# Patient Record
Sex: Female | Born: 1995 | Race: White | Hispanic: No | Marital: Single | Smoking: Former smoker
Health system: Southern US, Community
[De-identification: ages and names within clinical notes are randomized; demographics above are authoritative.]

## PROBLEM LIST (undated history)

## (undated) ENCOUNTER — Inpatient Hospital Stay: Payer: Self-pay

## (undated) DIAGNOSIS — N390 Urinary tract infection, site not specified: Secondary | ICD-10-CM

## (undated) HISTORY — DX: Urinary tract infection, site not specified: N39.0

---
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
## 2016-01-22 ENCOUNTER — Emergency Department
Admission: EM | Admit: 2016-01-22 | Discharge: 2016-01-22 | Disposition: A | Discharge status: Home / Self Care | Payer: 59 | Attending: Emergency Medicine | Admitting: Emergency Medicine

## 2016-01-22 DIAGNOSIS — B349 Viral infection, unspecified: Secondary | ICD-10-CM | POA: Insufficient documentation

## 2016-01-22 DIAGNOSIS — J069 Acute upper respiratory infection, unspecified: Secondary | ICD-10-CM | POA: Insufficient documentation

## 2016-01-22 MED ORDER — ONDANSETRON HCL 4 MG PO TABS: 4.0000 mg | ORAL_TABLET | Freq: Every day | ORAL | Status: AC | PRN

## 2016-01-22 NOTE — ED Provider Notes (Signed)
[facility] Emergency Department Provider Note  ____________________________________________  Time seen: Approximately 3:35 PM  I have reviewed the triage vital signs and the nursing notes.   HISTORY  Chief Complaint URI    HPI Kathleen Glass is a 20 y.o. female with 2 days of fevers, chills, mild cough, diarrhea and decreased appetite. No vomiting. Fever 102 last evening. Last diarrhea episode yesterday. No abdominal pain currently. No dysuria or vaginal symptoms.   History reviewed. No pertinent past medical history.  Patient Active Problem List   Diagnosis Date Noted  . Lump or mass in breast 05/30/2013  . 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 Breast surgery Left 06-12-13    benign fibroadenoma    Current Outpatient Rx  Name  Route  Sig  Dispense  Refill  . Alum & Mag Hydroxide-Simeth (MAGIC MOUTHWASH W/LIDOCAINE) SOLN   Oral   Take 5 mLs by mouth 4 (four) times daily.   100 mL   0   . nystatin (MYCOSTATIN) 100000 UNIT/ML suspension   Oral   Take 5 mLs (500,000 Units total) by mouth 4 (four) times daily.   120 mL   0   . ondansetron (ZOFRAN) 4 MG tablet   Oral   Take 1 tablet (4 mg total) by mouth daily as needed for nausea or vomiting.   10 tablet   0     Allergies Review of patient's allergies indicates no known allergies.  Family History  Problem Relation Age of Onset  . Breast cancer Paternal Aunt     great aunt  . Breast cancer Other     Social History Social History  Substance Use Topics  . Smoking status: Never Smoker   . Smokeless tobacco: None  . Alcohol Use: No    Review of Systems Constitutional:  fever/chills Eyes: No visual changes. ENT: No sore throat. Cardiovascular: Denies chest pain. Respiratory: Denies shortness of breath. Gastrointestinal: per HPI Genitourinary: Negative for dysuria. Musculoskeletal: Negative for back pain. Skin: Negative for  rash. Neurological: Negative for headaches, focal weakness or numbness. 10-point ROS otherwise negative.  ____________________________________________   PHYSICAL EXAM:  VITAL SIGNS: ED Triage Vitals  Enc Vitals Group     BP 01/22/16 1444 127/90 mmHg     Pulse Rate 01/22/16 1444 104     Resp 01/22/16 1444 16     Temp 01/22/16 1444 98.4 F (36.9 C)     Temp Source 01/22/16 1444 Oral     SpO2 01/22/16 1444 97 %     Weight 01/22/16 1444 108 lb (48.988 kg)     Height 01/22/16 1444 5\' 2"  (1.575 m)     Head Cir --      Peak Flow --      Pain Score 01/22/16 1445 9     Pain Loc --      Pain Edu? --      Excl. in [place]? --     Constitutional: Alert and oriented. Well appearing and in no acute distress. Eyes: Conjunctivae are normal. PERRL. EOMI. Ears:  Clear with normal landmarks. No erythema. Head: Atraumatic. Nose: No congestion/rhinnorhea. Mouth/Throat: Mucous membranes are moist.  Oropharynx non-erythematous. No lesions. Neck:  Supple.  No adenopathy.   Cardiovascular: Normal rate, regular rhythm. Grossly normal heart sounds.  Good peripheral circulation. Respiratory: Normal respiratory effort.  No retractions. Lungs CTAB. Gastrointestinal: Soft and nontender. No distention. No abdominal bruits.  Musculoskeletal: Nml ROM of upper and  lower extremity joints. Neurologic:  Normal speech and language. No gross focal neurologic deficits are appreciated. No gait instability. Skin:  Skin is warm, dry and intact. No rash noted. Psychiatric: Mood and affect are normal. Speech and behavior are normal.  ____________________________________________   LABS (all labs ordered are listed, but only abnormal results are displayed)  Labs Reviewed - No data to display ____________________________________________  EKG   ____________________________________________  RADIOLOGY   ____________________________________________   PROCEDURES  Procedure(s) performed: None  Critical Care  performed: No  ____________________________________________   INITIAL IMPRESSION / ASSESSMENT AND PLAN / ED COURSE  Pertinent labs & imaging results that were available during my care of the patient were reviewed by me and considered in my medical decision making (see chart for details).  20 year old with 2 days of fever, chills, myalgias, cough, diarrhea consistent with viral syndrome possibly, influenza. Recommend symptomatic treatment.  Last episode of diarrhea was yesterday. She is taking by mouth fluids today. Continue ibuprofen or Tylenol as needed. Given Zofran if needed. Return to emergency for any worsening symptoms. ____________________________________________   FINAL CLINICAL IMPRESSION(S) / ED DIAGNOSES  Final diagnoses:  Viral syndrome      Mortimer Fries, PA-C 01/22/16 1538  Mortimer Fries, PA-C 01/22/16 1538  Hinda Kehr, MD 01/22/16 2054

## 2016-01-22 NOTE — ED Notes (Signed)
Pt presents with cough, body aches and diarrhea for two days. Pt states has had fever up to 101.

## 2016-01-22 NOTE — ED Notes (Signed)
Pt c/o cough with congestion with bodyaches for the past 2 days.

## 2016-01-22 NOTE — Discharge Instructions (Signed)
Take nausea medicine if needed. Continue on bland diet mostly liquids. Especially Gatorade. Return to emergency room for any worsening symptoms. Continue Profen as needed for fever and body aches.

## 2016-02-18 ENCOUNTER — Emergency Department
Admission: EM | Admit: 2016-02-18 | Discharge: 2016-02-18 | Disposition: A | Discharge status: Home / Self Care | Payer: Self-pay | Attending: Emergency Medicine | Admitting: Emergency Medicine

## 2016-02-18 ENCOUNTER — Encounter: Payer: Self-pay | Admitting: Emergency Medicine

## 2016-02-18 DIAGNOSIS — F17210 Nicotine dependence, cigarettes, uncomplicated: Secondary | ICD-10-CM | POA: Insufficient documentation

## 2016-02-18 DIAGNOSIS — N912 Amenorrhea, unspecified: Secondary | ICD-10-CM | POA: Insufficient documentation

## 2016-02-18 LAB — POCT PREGNANCY, URINE: Preg Test, Ur: NEGATIVE

## 2016-02-18 NOTE — ED Notes (Signed)
States breast tenderness began 2 weeks ago. Last period beginning of month. States took OTC pregnancy test and was negative but came here because she wants to be sure.

## 2016-02-18 NOTE — ED Provider Notes (Signed)
[facility] Emergency Department Provider Note  ____________________________________________  Time seen: Approximately 4:04 PM  I have reviewed the triage vital signs and the nursing notes.   HISTORY  Chief Complaint Breast Pain    HPI Kathleen Glass is a 20 y.o. female presents for evaluation of patient's status. Patient states that she had a period 2 weeks ago but wasn't normal. Took over-the-counter pregnancy test on this morning was negative she just wants verification. Denies any other complaints at this time.   History reviewed. No pertinent past medical history.  Patient Active Problem List   Diagnosis Date Noted  . Lump or mass in breast 05/30/2013  . 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 Breast surgery Left 06-12-13    benign fibroadenoma    Current Outpatient Rx  Name  Route  Sig  Dispense  Refill  . Alum & Mag Hydroxide-Simeth (MAGIC MOUTHWASH W/LIDOCAINE) SOLN   Oral   Take 5 mLs by mouth 4 (four) times daily.   100 mL   0   . nystatin (MYCOSTATIN) 100000 UNIT/ML suspension   Oral   Take 5 mLs (500,000 Units total) by mouth 4 (four) times daily.   120 mL   0   . ondansetron (ZOFRAN) 4 MG tablet   Oral   Take 1 tablet (4 mg total) by mouth daily as needed for nausea or vomiting.   10 tablet   0     Allergies Review of patient's allergies indicates no known allergies.  Family History  Problem Relation Age of Onset  . Breast cancer Paternal Aunt     great aunt  . Breast cancer Other     Social History Social History  Substance Use Topics  . Smoking status: Current Every Day Smoker -- 0.25 packs/day    Types: Cigarettes  . Smokeless tobacco: None  . Alcohol Use: No    Review of Systems Constitutional: No fever/chills Eyes: No visual changes. ENT: No sore throat. Cardiovascular: Denies chest pain. Respiratory: Denies shortness of breath. Gastrointestinal: No  abdominal pain.  No nausea, no vomiting.  No diarrhea.  No constipation. Genitourinary: Negative for dysuria. Musculoskeletal: Negative for back pain. Skin: Negative for rash. Neurological: Negative for headaches, focal weakness or numbness.  10-point ROS otherwise negative.  ____________________________________________   PHYSICAL EXAM:  VITAL SIGNS: ED Triage Vitals  Enc Vitals Group     BP 02/18/16 1407 135/82 mmHg     Pulse Rate 02/18/16 1407 113     Resp 02/18/16 1407 18     Temp 02/18/16 1407 97.8 F (36.6 C)     Temp Source 02/18/16 1407 Oral     SpO2 02/18/16 1407 96 %     Weight 02/18/16 1407 114 lb 8 oz (51.937 kg)     Height 02/18/16 1407 5\' 2"  (1.575 m)     Head Cir --      Peak Flow --      Pain Score --      Pain Loc --      Pain Edu? --      Excl. in [place]? --     Constitutional: Alert and oriented. Well appearing and in no acute distress. Cardiovascular: Normal rate, regular rhythm. Grossly normal heart sounds.  Good peripheral circulation. Respiratory: Normal respiratory effort.  No retractions. Lungs CTAB. Gastrointestinal: Soft and nontender. No distention. No abdominal bruits. No CVA tenderness. Musculoskeletal: No lower extremity tenderness nor edema.  No  joint effusions. Neurologic:  Normal speech and language. No gross focal neurologic deficits are appreciated. No gait instability. Skin:  Skin is warm, dry and intact. No rash noted. Psychiatric: Mood and affect are normal. Speech and behavior are normal.  ____________________________________________   LABS (all labs ordered are listed, but only abnormal results are displayed)  Labs Reviewed  POC URINE PREG, ED  POCT PREGNANCY, URINE   ____________________________________________    PROCEDURES  Procedure(s) performed: None  Critical Care performed: No  ____________________________________________   INITIAL IMPRESSION / ASSESSMENT AND PLAN / ED COURSE  Pertinent labs & imaging results  that were available during my care of the patient were reviewed by me and considered in my medical decision making (see chart for details).  Negative pregnancy. Encourage patient to repeat test in 2 weeks. For confirmation. Follow up PCP as needed. ____________________________________________   FINAL CLINICAL IMPRESSION(S) / ED DIAGNOSES  Final diagnoses:  Amenorrhea     This chart was dictated using voice recognition software/Dragon. Despite best efforts to proofread, errors can occur which can change the meaning. Any change was purely unintentional.   Arlyss Repress, PA-C 02/18/16 1605  Earleen Newport, MD 02/18/16 [phone]

## 2016-03-30 ENCOUNTER — Emergency Department
Admission: EM | Admit: 2016-03-30 | Discharge: 2016-03-30 | Disposition: A | Discharge status: Home / Self Care | Payer: Self-pay | Attending: Emergency Medicine | Admitting: Emergency Medicine

## 2016-03-30 ENCOUNTER — Encounter: Payer: Self-pay | Admitting: Emergency Medicine

## 2016-03-30 DIAGNOSIS — F17210 Nicotine dependence, cigarettes, uncomplicated: Secondary | ICD-10-CM | POA: Insufficient documentation

## 2016-03-30 DIAGNOSIS — N939 Abnormal uterine and vaginal bleeding, unspecified: Secondary | ICD-10-CM | POA: Insufficient documentation

## 2016-03-30 DIAGNOSIS — Z79899 Other long term (current) drug therapy: Secondary | ICD-10-CM | POA: Insufficient documentation

## 2016-03-30 LAB — URINALYSIS COMPLETE WITH MICROSCOPIC (ARMC ONLY)
Bilirubin Urine: NEGATIVE
GLUCOSE, UA: NEGATIVE mg/dL
KETONES UR: NEGATIVE mg/dL
NITRITE: NEGATIVE
PH: 6 (ref 5.0–8.0)
Protein, ur: 30 mg/dL — AB
SPECIFIC GRAVITY, URINE: 1.024 (ref 1.005–1.030)

## 2016-03-30 LAB — POCT PREGNANCY, URINE: Preg Test, Ur: NEGATIVE

## 2016-03-30 LAB — HCG, QUANTITATIVE, PREGNANCY: hCG, Beta Chain, Quant, S: <1 m[IU]/mL (ref <5)

## 2016-03-30 NOTE — ED Notes (Addendum)
See triage note  States her lower abd pressure stop but still having lower back pain. Also took 2 home preg test that were neg   Denies any injury to back   Also has had vaginal bleeding for about 2 months since stopping birth control  States she is having bleeding daily  Sometimes more than others .Kathleen Glass

## 2016-03-30 NOTE — ED Notes (Signed)
POCT PREGNANCY NEGATIVE

## 2016-03-30 NOTE — ED Provider Notes (Signed)
[facility] Emergency Department Provider Note  ____________________________________________  Time seen: Approximately 2:19 PM  I have reviewed the triage vital signs and the nursing notes.   HISTORY  Chief Complaint Back Pain    HPI Kathleen Glass is a 20 y.o. female , NAD, presents to the emergency department for 1 month history of abnormal menstrual cycle, lower abdominal pain and lower back pain. States she was administered the Depo-Provera shot approximately 4 months ago. Was unable to pay for the injections nor her pasty balance at the health department therefore was unable to go back 3 months later for her quarterly injection. States she had a normal menstrual cycle the first month after missing the second quarterly injection but over the last 30 days has had alternating menstrual flows with some breast tenderness, abdominal cramping and lower back pain. Denies any dysuria, hematuria, vaginal discharge other than the menstrual blood. States she has had increasing breast tenderness over the last couple of days which is not common for her menstrual cycles. Also notes lower back pain which is common for her menstrual cycle. Patient does not have a primary care provider nor OB/GYN. She is sexually active but again denies any exposures or history of STDs. Her mother is present at the bedside and is concerned about abnormal pregnancy.   History reviewed. No pertinent past medical history.  Patient Active Problem List   Diagnosis Date Noted  . Lump or mass in breast 05/30/2013  . 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 Breast surgery Left 06-12-13    benign fibroadenoma    Current Outpatient Rx  Name  Route  Sig  Dispense  Refill  . Alum & Mag Hydroxide-Simeth (MAGIC MOUTHWASH W/LIDOCAINE) SOLN   Oral   Take 5 mLs by mouth 4 (four) times daily.   100 mL   0   . nystatin (MYCOSTATIN) 100000 UNIT/ML  suspension   Oral   Take 5 mLs (500,000 Units total) by mouth 4 (four) times daily.   120 mL   0   . ondansetron (ZOFRAN) 4 MG tablet   Oral   Take 1 tablet (4 mg total) by mouth daily as needed for nausea or vomiting.   10 tablet   0     Allergies Review of patient's allergies indicates no known allergies.  Family History  Problem Relation Age of Onset  . Breast cancer Paternal Aunt     great aunt  . Breast cancer Other     Social History Social History  Substance Use Topics  . Smoking status: Current Every Day Smoker -- 0.25 packs/day    Types: Cigarettes  . Smokeless tobacco: None  . Alcohol Use: No     Review of Systems  Constitutional: No fever/chills, fatigue Eyes: No visual changes.  Cardiovascular: No chest pain. Respiratory: No cough. No shortness of breath. No wheezing.  Gastrointestinal: Positive lower abdominal pain.  No nausea, vomiting.  No diarrhea.  No constipation. Genitourinary: Positive abnormal menses. Negative for dysuria, hematuria. No urinary hesitancy, urgency or increased frequency. Musculoskeletal: Positive for lower back pain and breast tenderness.  Skin: Negative for rash, skin sores, redness, swelling, and wounds or lacerations. Neurological: Negative for headaches, focal weakness or numbness. No tingling 10-point ROS otherwise negative.  ____________________________________________   PHYSICAL EXAM:  VITAL SIGNS: ED Triage Vitals  Enc Vitals Group     BP 03/30/16 1257 131/71 mmHg     Pulse Rate 03/30/16  1257 79     Resp 03/30/16 1257 20     Temp 03/30/16 1257 98.3 F (36.8 C)     Temp Source 03/30/16 1257 Oral     SpO2 03/30/16 1257 100 %     Weight 03/30/16 1257 115 lb (52.164 kg)     Height 03/30/16 1257 5\' 3"  (1.6 m)     Head Cir --      Peak Flow --      Pain Score 03/30/16 1259 8     Pain Loc --      Pain Edu? --      Excl. in [place]? --      Constitutional: Alert and oriented. Well appearing and in no acute  distress. Eyes: Conjunctivae are normal.  Head: Atraumatic. Neck: Supple with full range of motion Hematological/Lymphatic/Immunilogical: No cervical lymphadenopathy. Cardiovascular: Normal rate, regular rhythm. Normal S1 and S2.  Good peripheral circulation with 2+ pulses noted in the upper extremities. Respiratory: Normal respiratory effort without tachypnea or retractions. Lungs CTAB with breath sounds noted in all lung fields. Gastrointestinal: Mild tenderness with deep palpation about the suprapubic area. All other quadrants were soft and nontender without distention, guarding. No CVA tenderness. Musculoskeletal: No lower extremity tenderness nor edema.  No joint effusions. Neurologic:  Normal speech and language. No gross focal neurologic deficits are appreciated.  Skin:  Skin is warm, dry and intact. No rash, redness, skin sores, open wounds or lacerations noted. Psychiatric: Mood and affect are normal. Speech and behavior are normal. Patient exhibits appropriate insight and judgement.   ____________________________________________   LABS (all labs ordered are listed, but only abnormal results are displayed)  Labs Reviewed  URINALYSIS COMPLETEWITH MICROSCOPIC ([place] ONLY) - Abnormal; Notable for the following:    Color, Urine YELLOW (*)    APPearance CLOUDY (*)    Hgb urine dipstick 3+ (*)    Protein, ur 30 (*)    Leukocytes, UA TRACE (*)    Bacteria, UA FEW (*)    Squamous Epithelial / LPF 6-30 (*)    All other components within normal limits  URINE CULTURE  HCG, QUANTITATIVE, PREGNANCY  POC URINE PREG, ED  POCT PREGNANCY, URINE   ____________________________________________  EKG  None ____________________________________________  RADIOLOGY  None ____________________________________________    PROCEDURES  Procedure(s) performed: None   Medications - No data to display   ____________________________________________   INITIAL IMPRESSION / ASSESSMENT  AND PLAN / ED COURSE  Pertinent lab results that were available during my care of the patient were reviewed by me and considered in my medical decision making (see chart for details).  Patient's diagnosis is consistent with abnormal uterine bleeding due to hormonal imbalance. Long discussion had with the patient and her mother at the bedside in regards to her current menstrual cycle. Anticipate is related to no longer bleeding on the Depo-Provera and will regulate within the coming month. Patient is advised to establish care with a local primary care provider or OB/GYN for follow-up if menstrual cycle does not regulate within 30 days. May take over-the-counter Advil or Aleve as needed for pain. We will culture the patient's urine to ensure no urinary tract infection. If patient has any increasing pain or onset of any new symptoms she is to return to this for further evaluation and treatment. Both the patient and her mother verbalized understanding of her physical exam, laboratory findings and discussion of discharge instructions.   ____________________________________________  FINAL CLINICAL IMPRESSION(S) / ED DIAGNOSES  Final diagnoses:  Abnormal  uterine bleeding      NEW MEDICATIONS STARTED DURING THIS VISIT:  Discharge Medication List as of 03/30/2016  3:13 PM           Braxton Feathers, PA-C 03/30/16 1632  Wandra Arthurs, MD 03/30/16 2157

## 2016-03-30 NOTE — ED Notes (Signed)
Having lower abd pressure and lower back pain  With some breast temderness

## 2016-03-30 NOTE — Discharge Instructions (Signed)

## 2016-04-01 LAB — URINE CULTURE: Culture: >=100000 — AB

## 2016-04-02 ENCOUNTER — Telehealth: Payer: Self-pay | Admitting: Pharmacist

## 2016-04-02 NOTE — Telephone Encounter (Signed)
Pt called and informed urine cx came back positive for UTI. Pt states she uses [facility] on [address]. Pt does not have RX insurance. Informed that drug is on [facility] 4$ list. [facility] left message on provider voicemail. Bactrim DS 1 tab po BID x 3 days. Rx authorized by Dr. Edd Fabian.   Ramond Dial, Pharm.D Clinical Pharmacist

## 2016-09-30 ENCOUNTER — Emergency Department
Admission: EM | Admit: 2016-09-30 | Discharge: 2016-09-30 | Disposition: A | Discharge status: Home / Self Care | Payer: Self-pay | Attending: Emergency Medicine | Admitting: Emergency Medicine

## 2016-09-30 ENCOUNTER — Encounter: Payer: Self-pay | Admitting: Emergency Medicine

## 2016-09-30 DIAGNOSIS — F17210 Nicotine dependence, cigarettes, uncomplicated: Secondary | ICD-10-CM | POA: Insufficient documentation

## 2016-09-30 DIAGNOSIS — N390 Urinary tract infection, site not specified: Secondary | ICD-10-CM | POA: Insufficient documentation

## 2016-09-30 DIAGNOSIS — E860 Dehydration: Secondary | ICD-10-CM | POA: Insufficient documentation

## 2016-09-30 DIAGNOSIS — R197 Diarrhea, unspecified: Secondary | ICD-10-CM

## 2016-09-30 DIAGNOSIS — Z79899 Other long term (current) drug therapy: Secondary | ICD-10-CM | POA: Insufficient documentation

## 2016-09-30 LAB — CBC
HEMATOCRIT: 43.2 % (ref 35.0–47.0)
Hemoglobin: 14.4 g/dL (ref 12.0–16.0)
MCH: 26.9 pg (ref 26.0–34.0)
MCHC: 33.5 g/dL (ref 32.0–36.0)
MCV: 80.5 fL (ref 80.0–100.0)
PLATELETS: 290 10*3/uL (ref 150–440)
RBC: 5.36 MIL/uL — ABNORMAL HIGH (ref 3.80–5.20)
RDW: 13.4 % (ref 11.5–14.5)
WBC: 6.5 10*3/uL (ref 3.6–11.0)

## 2016-09-30 LAB — COMPREHENSIVE METABOLIC PANEL
ALBUMIN: 4.5 g/dL (ref 3.5–5.0)
ALT: 22 U/L (ref 14–54)
AST: 22 U/L (ref 15–41)
Alkaline Phosphatase: 70 U/L (ref 38–126)
Anion gap: 6 (ref 5–15)
BILIRUBIN TOTAL: 0.5 mg/dL (ref 0.3–1.2)
BUN: 17 mg/dL (ref 6–20)
CHLORIDE: 104 mmol/L (ref 101–111)
CO2: 27 mmol/L (ref 22–32)
Calcium: 9.8 mg/dL (ref 8.9–10.3)
Creatinine, Ser: 0.73 mg/dL (ref 0.44–1.00)
GFR calc Af Amer: >60 mL/min (ref >60)
GLUCOSE: 99 mg/dL (ref 65–99)
POTASSIUM: 4.1 mmol/L (ref 3.5–5.1)
Sodium: 137 mmol/L (ref 135–145)
Total Protein: 8.3 g/dL — ABNORMAL HIGH (ref 6.5–8.1)

## 2016-09-30 LAB — URINALYSIS COMPLETE WITH MICROSCOPIC (ARMC ONLY)
Bilirubin Urine: NEGATIVE
GLUCOSE, UA: NEGATIVE mg/dL
Hgb urine dipstick: NEGATIVE
Ketones, ur: NEGATIVE mg/dL
Nitrite: NEGATIVE
PROTEIN: 100 mg/dL — AB
Specific Gravity, Urine: 1.025 (ref 1.005–1.030)
pH: 5 (ref 5.0–8.0)

## 2016-09-30 LAB — LIPASE, BLOOD: Lipase: 17 U/L (ref 11–51)

## 2016-09-30 LAB — POCT PREGNANCY, URINE: PREG TEST UR: NEGATIVE

## 2016-09-30 MED ORDER — DEXTROSE 5 % IV SOLN
1.0000 g | Freq: Once | INTRAVENOUS | Status: AC
  Administered 2016-09-30: 1 g via INTRAVENOUS
  Filled 2016-09-30 (×2): qty 10

## 2016-09-30 MED ORDER — SODIUM CHLORIDE 0.9 % IV BOLUS (SEPSIS)
1000.0000 mL | Freq: Once | INTRAVENOUS | Status: AC
  Administered 2016-09-30: 1000 mL via INTRAVENOUS

## 2016-09-30 MED ORDER — ONDANSETRON HCL 4 MG/2ML IJ SOLN
4.0000 mg | Freq: Once | INTRAMUSCULAR | Status: AC
  Administered 2016-09-30: 4 mg via INTRAVENOUS
  Filled 2016-09-30: qty 2

## 2016-09-30 MED ORDER — KETOROLAC TROMETHAMINE 30 MG/ML IJ SOLN
30.0000 mg | Freq: Once | INTRAMUSCULAR | Status: AC
  Administered 2016-09-30: 30 mg via INTRAVENOUS
  Filled 2016-09-30: qty 1

## 2016-09-30 MED ORDER — CEPHALEXIN 500 MG PO CAPS: 500.0000 mg | ORAL_CAPSULE | Freq: Four times a day (QID) | ORAL | 0 refills | Status: AC

## 2016-09-30 MED ORDER — ONDANSETRON 4 MG PO TBDP: 4.0000 mg | ORAL_TABLET | Freq: Three times a day (TID) | ORAL | 0 refills | Status: DC | PRN

## 2016-09-30 NOTE — Discharge Instructions (Signed)

## 2016-09-30 NOTE — ED Triage Notes (Signed)
Says she has dizziness, back pain.  Says she needs pregnancy test.

## 2016-09-30 NOTE — ED Provider Notes (Signed)
[facility] Emergency Department Provider Note  ____________________________________________  Time seen: Approximately 12:29 PM  I have reviewed the triage vital signs and the nursing notes.   HISTORY  Chief Complaint Dizziness and Back Pain   HPI Kathleen Glass is a 20 y.o. female no significant past medical history who presents for evaluation of dizziness, frequency, and back pain. Patient reports a week of urinary frequency. She has had 2 days of worsening low back pain. She does have a history of chronic back pain however the pain is more pronounced, dull, moderate, located in her bilateral flanks. Since yesterday patient has had nausea and 1 episode of nonbloody nonbilious emesis. She also has had diarrhea for the last 2 days with watery non-melanotic stools. Patient denies fever, chills, coughing, shortness of breath, chest pain. LMP was 2 weeks ago. She does not take any birth control. Since yesterday patient is been feeling dizzy every time she stands up. She also endorses mild cramping suprapubic abdominal pain 1 week that is intermittent and nonradiating. No pain at this time.  History reviewed. No pertinent past medical history.  Patient Active Problem List   Diagnosis Date Noted  . Lump or mass in breast 05/30/2013  . 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 BREAST SURGERY Left 06-12-13   benign fibroadenoma    Prior to Admission medications   Medication Sig Start Date End Date Taking? Authorizing Provider  Alum & Mag Hydroxide-Simeth (MAGIC MOUTHWASH W/LIDOCAINE) SOLN Take 5 mLs by mouth 4 (four) times daily. 06/02/15   Sable Feil, PA-C  cephALEXin (KEFLEX) 500 MG capsule Take 1 capsule (500 mg total) by mouth 4 (four) times daily. 09/30/16 10/07/16  Rudene Re, MD  nystatin (MYCOSTATIN) 100000 UNIT/ML suspension Take 5 mLs (500,000 Units total) by mouth 4 (four) times daily. 06/02/15    Sable Feil, PA-C  ondansetron (ZOFRAN ODT) 4 MG disintegrating tablet Take 1 tablet (4 mg total) by mouth every 8 (eight) hours as needed for nausea or vomiting. 09/30/16   Rudene Re, MD  ondansetron (ZOFRAN) 4 MG tablet Take 1 tablet (4 mg total) by mouth daily as needed for nausea or vomiting. 01/22/16 01/21/17  Mortimer Fries, PA-C    Allergies Patient has no known allergies.  Family History  Problem Relation Age of Onset  . Breast cancer Other   . Breast cancer Paternal Aunt     great aunt    Social History Social History  Substance Use Topics  . Smoking status: Current Every Day Smoker    Packs/day: 0.25    Types: Cigarettes  . Smokeless tobacco: Never Used  . Alcohol use No    Review of Systems  Constitutional: Negative for fever. Eyes: Negative for visual changes. ENT: Negative for sore throat. Neck: No neck pain  Cardiovascular: Negative for chest pain. Respiratory: Negative for shortness of breath. Gastrointestinal: + suprapubic abdominal pain, nausea, vomiting and diarrhea. Genitourinary: Negative for dysuria. + frequency Musculoskeletal: Negative for back pain. + b/l flank pain Skin: Negative for rash. Neurological: Negative for headaches, weakness or numbness. Psych: No SI or HI  ____________________________________________   PHYSICAL EXAM:  VITAL SIGNS: ED Triage Vitals  Enc Vitals Group     BP 09/30/16 0959 124/74     Pulse Rate 09/30/16 0959 89     Resp 09/30/16 0959 20     Temp 09/30/16 0959 98.7 F (37.1 C)     Temp Source 09/30/16  [id number] Oral     SpO2 09/30/16 0959 100 %     Weight 09/30/16 0959 115 lb (52.2 kg)     Height 09/30/16 0959 5\' 2"  (1.575 m)     Head Circumference --      Peak Flow --      Pain Score 09/30/16 0954 8     Pain Loc --      Pain Edu? --      Excl. in [place]? --     Constitutional: Alert and oriented. Well appearing and in no apparent distress. HEENT:      Head: Normocephalic and atraumatic.         Eyes:  Conjunctivae are normal. Sclera is non-icteric. EOMI. PERRL      Mouth/Throat: Mucous membranes are moist.       Neck: Supple with no signs of meningismus. Cardiovascular: Regular rate and rhythm. No murmurs, gallops, or rubs. 2+ symmetrical distal pulses are present in all extremities. No JVD. Respiratory: Normal respiratory effort. Lungs are clear to auscultation bilaterally. No wheezes, crackles, or rhonchi.  Gastrointestinal: Soft, non tender, and non distended with positive bowel sounds. No rebound or guarding. Genitourinary: No CVA tenderness. Musculoskeletal: Nontender with normal range of motion in all extremities. No edema, cyanosis, or erythema of extremities. Neurologic: Normal speech and language. Face is symmetric. Moving all extremities. No gross focal neurologic deficits are appreciated. Skin: Skin is warm, dry and intact. No rash noted. Psychiatric: Mood and affect are normal. Speech and behavior are normal.  ____________________________________________   LABS (all labs ordered are listed, but only abnormal results are displayed)  Labs Reviewed  COMPREHENSIVE METABOLIC PANEL - Abnormal; Notable for the following:       Result Value   Total Protein 8.3 (*)    All other components within normal limits  CBC - Abnormal; Notable for the following:    RBC 5.36 (*)    All other components within normal limits  URINALYSIS COMPLETEWITH MICROSCOPIC ([facility] ONLY) - Abnormal; Notable for the following:    Color, Urine YELLOW (*)    APPearance CLOUDY (*)    Protein, ur 100 (*)    Leukocytes, UA TRACE (*)    Bacteria, UA FEW (*)    Squamous Epithelial / LPF 6-30 (*)    All other components within normal limits  URINE CULTURE  LIPASE, BLOOD  POC URINE PREG, ED  POCT PREGNANCY, URINE   ____________________________________________  EKG  none  ____________________________________________  RADIOLOGY  none   ____________________________________________   PROCEDURES  Procedure(s) performed: None Procedures Critical Care performed:  None ____________________________________________   INITIAL IMPRESSION / ASSESSMENT AND PLAN / ED COURSE  20 y.o. female no significant past medical history who presents for evaluation of 1 week of urinary frequency, 2 days of diarrhea, nausea, vomiting, suprapubic cramping, urinary frequency, and flank pain. Patient orthostatic on exam. Her vital signs are otherwise within normal limits. Her abdomen is soft and nontender, no flank tenderness. Blood work showed a normal white count, normal kidney function, pregnancy is negative. UA consistent with urinary tract infection. We'll give her IV fluids, IV Toradol, IV Zofran, IV ceftriaxone. Anticipate discharge.  Clinical Course    Patient Received 2 L of fluid, IV Toradol, IV Zofran, and IV ceftriaxone. She reports that she feels markedly improved. She'll be discharged home on Keflex with close follow-up with primary care doctor.   Pertinent labs & imaging results that were available during my care of the patient were reviewed by me and  considered in my medical decision making (see chart for details).    ____________________________________________   FINAL CLINICAL IMPRESSION(S) / ED DIAGNOSES  Final diagnoses:  Urinary tract infection without hematuria, site unspecified  Dehydration  Diarrhea, unspecified type      NEW MEDICATIONS STARTED DURING THIS VISIT:  New Prescriptions   CEPHALEXIN (KEFLEX) 500 MG CAPSULE    Take 1 capsule (500 mg total) by mouth 4 (four) times daily.   ONDANSETRON (ZOFRAN ODT) 4 MG DISINTEGRATING TABLET    Take 1 tablet (4 mg total) by mouth every 8 (eight) hours as needed for nausea or vomiting.     Note:  This document was prepared using Dragon voice recognition software and may include unintentional dictation errors.    Rudene Re, MD 09/30/16 [phone]

## 2016-10-01 LAB — URINE CULTURE: CULTURE: NO GROWTH

## 2016-11-01 NOTE — L&D Delivery Note (Signed)
1635 In room to see pt, reports pelvic pressure with the urge to push. Coached maternal pushing efforts initiated and descent of fetal head noted. Variable decelerations present with contractions. Pt turned to left lateral position and encourage to push every other contraction.   43 Dr. Amalia Hailey notified of [place] tracing by charge RN, on his way for bedside evaluation.   Spontaneous vaginal birth of liveborn female patient at 64 in vertex position, left occiput posterior position. Triple nuchal cord reduced on perineum. Infant immediately to maternal abdomen.  Cord clamped and cord gas collected. Infant to receiving nurse and NNP for evaluation.  APGAR: 7,8 ; weight 3160 grams (6 lb 15.5 oz).    Pitocin infusing. Spontaneous delivery of placenta 1740. Bilateral labial lacerations repaired under local and epidural anesthesia with 3-0 vicryl rapide. QBL: 149 ml.   Initiate routine postpartum care and orders. Mom to postpartum.  Baby to special care nursery for evaluation.    Diona Fanti, CNM 10/07/2017, 6:59 PM

## 2017-01-23 ENCOUNTER — Emergency Department: Payer: Medicaid Other

## 2017-01-23 ENCOUNTER — Encounter: Payer: Self-pay | Admitting: Radiology

## 2017-01-23 ENCOUNTER — Emergency Department
Admission: EM | Admit: 2017-01-23 | Discharge: 2017-01-23 | Disposition: A | Discharge status: Home / Self Care | Payer: Medicaid Other | Attending: Emergency Medicine | Admitting: Emergency Medicine

## 2017-01-23 DIAGNOSIS — R102 Pelvic and perineal pain: Secondary | ICD-10-CM | POA: Diagnosis not present

## 2017-01-23 DIAGNOSIS — N644 Mastodynia: Secondary | ICD-10-CM | POA: Diagnosis not present

## 2017-01-23 DIAGNOSIS — O9989 Other specified diseases and conditions complicating pregnancy, childbirth and the puerperium: Secondary | ICD-10-CM

## 2017-01-23 DIAGNOSIS — Z3A01 Less than 8 weeks gestation of pregnancy: Secondary | ICD-10-CM

## 2017-01-23 DIAGNOSIS — N760 Acute vaginitis: Secondary | ICD-10-CM

## 2017-01-23 DIAGNOSIS — M545 Low back pain, unspecified: Secondary | ICD-10-CM

## 2017-01-23 DIAGNOSIS — F17210 Nicotine dependence, cigarettes, uncomplicated: Secondary | ICD-10-CM | POA: Diagnosis not present

## 2017-01-23 DIAGNOSIS — O99891 Other specified diseases and conditions complicating pregnancy: Secondary | ICD-10-CM

## 2017-01-23 DIAGNOSIS — O23591 Infection of other part of genital tract in pregnancy, first trimester: Secondary | ICD-10-CM | POA: Insufficient documentation

## 2017-01-23 DIAGNOSIS — R1030 Lower abdominal pain, unspecified: Secondary | ICD-10-CM

## 2017-01-23 DIAGNOSIS — B9689 Other specified bacterial agents as the cause of diseases classified elsewhere: Secondary | ICD-10-CM

## 2017-01-23 LAB — POCT PREGNANCY, URINE: Preg Test, Ur: POSITIVE — AB

## 2017-01-23 LAB — URINALYSIS, COMPLETE (UACMP) WITH MICROSCOPIC
BILIRUBIN URINE: NEGATIVE
GLUCOSE, UA: NEGATIVE mg/dL
HGB URINE DIPSTICK: NEGATIVE
Ketones, ur: NEGATIVE mg/dL
LEUKOCYTES UA: NEGATIVE
NITRITE: NEGATIVE
PH: 6 (ref 5.0–8.0)
Protein, ur: 100 mg/dL — AB
SPECIFIC GRAVITY, URINE: 1.025 (ref 1.005–1.030)

## 2017-01-23 LAB — HCG, QUANTITATIVE, PREGNANCY: hCG, Beta Chain, Quant, S: 618 m[IU]/mL — ABNORMAL HIGH (ref <5)

## 2017-01-23 LAB — CHLAMYDIA/NGC RT PCR (ARMC ONLY)
Chlamydia Tr: NOT DETECTED
N gonorrhoeae: NOT DETECTED

## 2017-01-23 LAB — WET PREP, GENITAL
Sperm: NONE SEEN
TRICH WET PREP: NONE SEEN
WBC WET PREP: NONE SEEN
YEAST WET PREP: NONE SEEN

## 2017-01-23 MED ORDER — ACETAMINOPHEN 325 MG PO TABS
650.0000 mg | ORAL_TABLET | Freq: Once | ORAL | Status: AC
  Administered 2017-01-23: 650 mg via ORAL
  Filled 2017-01-23: qty 2

## 2017-01-23 MED ORDER — METRONIDAZOLE 500 MG PO TABS: 500.0000 mg | ORAL_TABLET | Freq: Two times a day (BID) | ORAL | 0 refills | Status: AC

## 2017-01-23 MED ORDER — PRENATAL VITAMINS 0.8 MG PO TABS
1.0000 | ORAL_TABLET | Freq: Every day | ORAL | 0 refills | Status: DC
Start: 2017-01-23 — End: 2018-06-27

## 2017-01-23 MED ORDER — METRONIDAZOLE 500 MG PO TABS
500.0000 mg | ORAL_TABLET | Freq: Once | ORAL | Status: AC
  Administered 2017-01-23: 500 mg via ORAL

## 2017-01-23 MED ORDER — ONDANSETRON 4 MG PO TBDP: 4.0000 mg | ORAL_TABLET | Freq: Three times a day (TID) | ORAL | 0 refills | Status: DC | PRN

## 2017-01-23 MED ORDER — METRONIDAZOLE 500 MG PO TABS
ORAL_TABLET | ORAL | Status: AC
  Administered 2017-01-23: 500 mg via ORAL
  Filled 2017-01-23: qty 1

## 2017-01-23 NOTE — ED Notes (Signed)
Pt. Going home with boyfriend.

## 2017-01-23 NOTE — ED Triage Notes (Signed)
Pt reports lower abd pain, lower back pain, breast tenderness, late for her menstral cycle, denies pain with urination

## 2017-01-23 NOTE — Discharge Instructions (Addendum)
Please drink plenty of fluid to stay well hydrated.  Take a prenatal vitamin daily. Finish the entire course of Flagyl for your bacterial vaginosis, even if you are feeling fine.  Please make an appointment to see Dr. Marcelline Mates, the OB-Gyn on call.  Please return to the emergency department for severe pain, fever, vaginal bleeding, or any other symptoms concerning to you.

## 2017-01-23 NOTE — ED Provider Notes (Signed)
[facility] Emergency Department Provider Note  ____________________________________________  Time seen: Approximately 7:23 PM  I have reviewed the triage vital signs and the nursing notes.   HISTORY  Chief Complaint Abdominal Pain    HPI Kathleen Glass is a 21 y.o. female G1 P0 with LMP 2/20 presenting with one episode of nausea and vomiting, lower abdominal cramping and back pain, and late for her menstrual cycle. On arrival to the emergency department, the patient has a positive urine pregnancy test. She denies any vaginal bleeding, change in vaginal discharge, lightheadedness or syncope. No diarrhea or constipation   No past medical history on file.  Patient Active Problem List   Diagnosis Date Noted  . Lump or mass in breast 05/30/2013  . 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 BREAST SURGERY Left 06-12-13   benign fibroadenoma    Current Outpatient Rx  . Order #: [id number] Class: Print  . Order #: [id number] Class: Print  . Order #: [id number] Class: Print  . Order #: [id number] Class: Print  . Order #: [id number] Class: Print    Allergies Patient has no known allergies.  Family History  Problem Relation Age of Onset  . Breast cancer Other   . Breast cancer Paternal Aunt     great aunt    Social History Social History  Substance Use Topics  . Smoking status: Current Every Day Smoker    Packs/day: 0.25    Types: Cigarettes  . Smokeless tobacco: Never Used  . Alcohol use No    Review of Systems Constitutional: No fever/chills. Eyes: No visual changes. ENT: No sore throat. No congestion or rhinorrhea. Cardiovascular: Denies chest pain. Denies palpitations. Respiratory: Denies shortness of breath.  No cough. Gastrointestinal: No abdominal pain.  Positive nausea, positive vomiting.  No diarrhea.  No constipation. Genitourinary: Negative for dysuria.No change in vaginal discharge. No  vaginal bleeding. Positive pregnancy. Musculoskeletal: Positive for back pain. Skin: Negative for rash. Neurological: Negative for headaches. No focal numbness, tingling or weakness.   10-point ROS otherwise negative.  ____________________________________________   PHYSICAL EXAM:  VITAL SIGNS: ED Triage Vitals  Enc Vitals Group     BP 01/23/17 1754 123/72     Pulse Rate 01/23/17 1754 97     Resp 01/23/17 1754 18     Temp 01/23/17 1754 98.9 F (37.2 C)     Temp Source 01/23/17 1754 Oral     SpO2 01/23/17 1754 95 %     Weight 01/23/17 1755 120 lb (54.4 kg)     Height 01/23/17 1755 5\' 2"  (1.575 m)     Head Circumference --      Peak Flow --      Pain Score 01/23/17 1802 6     Pain Loc --      Pain Edu? --      Excl. in [place]? --     Constitutional: Alert and oriented. Well appearing and in no acute distress. Answers questions appropriately. Eyes: Conjunctivae are normal.  EOMI. No scleral icterus. Head: Atraumatic. Nose: No congestion/rhinnorhea. Mouth/Throat: Mucous membranes are moist.  Neck: No stridor.  Supple.   Cardiovascular: Normal rate, regular rhythm. No murmurs, rubs or gallops.  Respiratory: Normal respiratory effort.  No accessory muscle use or retractions. Lungs CTAB.  No wheezes, rales or ronchi. Gastrointestinal: Soft, nontender and nondistended.  No guarding or rebound.  No peritoneal signs. Genitourinary: Normal-appearing external genitalia without lesions. Vaginal exam with white thin discharge, normal-appearing cervix, normal  vaginal wall tissue. Bimanual exam is negative for CMT, adnexal tenderness to palpation, no palpable masses. Os is closed.  Musculoskeletal: No LE edema.  Neurologic:  A&Ox3.  Speech is clear.  Face and smile are symmetric.  EOMI.  Moves all extremities well. Skin:  Skin is warm, dry and intact. No rash noted. Psychiatric: Mood and affect are normal. Speech and behavior are normal.  Normal  judgement.  ____________________________________________   LABS (all labs ordered are listed, but only abnormal results are displayed)  Labs Reviewed  WET PREP, GENITAL - Abnormal; Notable for the following:       Result Value   Clue Cells Wet Prep HPF POC PRESENT (*)    All other components within normal limits  URINALYSIS, COMPLETE (UACMP) WITH MICROSCOPIC - Abnormal; Notable for the following:    Color, Urine YELLOW (*)    APPearance HAZY (*)    Protein, ur 100 (*)    Bacteria, UA RARE (*)    Squamous Epithelial / LPF 0-5 (*)    All other components within normal limits  HCG, QUANTITATIVE, PREGNANCY - Abnormal; Notable for the following:    hCG, Beta Chain, Quant, S 618 (*)    All other components within normal limits  POCT PREGNANCY, URINE - Abnormal; Notable for the following:    Preg Test, Ur POSITIVE (*)    All other components within normal limits  CHLAMYDIA/NGC RT PCR ([facility] ONLY)  POC URINE PREG, ED   ____________________________________________  EKG  Not indicated ____________________________________________  RADIOLOGY  US Ob Comp Less 14 Wks  Result Date: 01/23/2017 CLINICAL DATA:  Acute onset of lower abdominal pain and lower back pain. Initial encounter. EXAM: OBSTETRIC <14 WK [place] AND TRANSVAGINAL OB US TECHNIQUE: Both transabdominal and transvaginal ultrasound examinations were performed for complete evaluation of the gestation as well as the maternal uterus, adnexal regions, and pelvic cul-de-sac. Transvaginal technique was performed to assess early pregnancy. COMPARISON:  None. FINDINGS: Intrauterine gestational sac: None seen. Yolk sac:  N/A Embryo:  N/A Subchorionic hemorrhage:  None visualized. Maternal uterus/adnexae: The endometrial complex measures 10 mm in thickness. The uterus is otherwise unremarkable. The ovaries are within normal limits. The right ovary measures 4.3 x 2.8 x 2.5 cm, while the left ovary measures 4.9 x 2.5 x 2.7 cm. No  suspicious adnexal masses are seen; there is no evidence for ovarian torsion. Trace free fluid is seen within the pelvic cul-de-sac. IMPRESSION: No intrauterine gestational sac seen. No evidence for ectopic pregnancy at this time. If the quantitative beta HCG continues to rise, follow-up pelvic ultrasound could be performed in 2 weeks for further evaluation. Electronically Signed   By: Garald Balding M.D.   On: 01/23/2017 20:43   US Ob Transvaginal  Result Date: 01/23/2017 CLINICAL DATA:  Acute onset of lower abdominal pain and lower back pain. Initial encounter. EXAM: OBSTETRIC <14 WK [place] AND TRANSVAGINAL OB US TECHNIQUE: Both transabdominal and transvaginal ultrasound examinations were performed for complete evaluation of the gestation as well as the maternal uterus, adnexal regions, and pelvic cul-de-sac. Transvaginal technique was performed to assess early pregnancy. COMPARISON:  None. FINDINGS: Intrauterine gestational sac: None seen. Yolk sac:  N/A Embryo:  N/A Subchorionic hemorrhage:  None visualized. Maternal uterus/adnexae: The endometrial complex measures 10 mm in thickness. The uterus is otherwise unremarkable. The ovaries are within normal limits. The right ovary measures 4.3 x 2.8 x 2.5 cm, while the left ovary measures 4.9 x 2.5 x 2.7 cm. No suspicious adnexal masses are  seen; there is no evidence for ovarian torsion. Trace free fluid is seen within the pelvic cul-de-sac. IMPRESSION: No intrauterine gestational sac seen. No evidence for ectopic pregnancy at this time. If the quantitative beta HCG continues to rise, follow-up pelvic ultrasound could be performed in 2 weeks for further evaluation. Electronically Signed   By: Garald Balding M.D.   On: 01/23/2017 20:43    ____________________________________________   PROCEDURES  Procedure(s) performed: None  Procedures  Critical Care performed: No ____________________________________________   INITIAL IMPRESSION / ASSESSMENT AND PLAN /  ED COURSE  Pertinent labs & imaging results that were available during my care of the patient were reviewed by me and considered in my medical decision making (see chart for details).  21 y.o. G1 P0 with a diagnosis of pregnancy presenting with lower abdominal cramping, back pain, one episode of nausea and vomiting, chest tenderness. The patient has stable vital signs here and is not feeling nauseated. She has been tolerating by mouth today. We will evaluate her for UTI, intravaginal infection, and obtain an hCG and ultrasound for her lower abdominal discomfort. On reevaluation for final disposition.  ----------------------------------------- 9:49 PM on 01/23/2017 -----------------------------------------  The patient has ultrasound which does not show any intrauterine pregnancy. Given her LMP, and her hCG is 600, this is not unexpected. The patient will need OB/GYN follow-up for continued monitoring of her likely early pregnancy. She has received ectopic precautions. She also will be discharged with a prescription for Flagyl for bacterial vaginosis, and instructions to have her OB/GYN follow-up her gonorrhea and chlamydia testing. She will go home with a prescription for a prenatal tablet. Plan discharge at this time.  ____________________________________________  FINAL CLINICAL IMPRESSION(S) / ED DIAGNOSES  Final diagnoses:  Less than [redacted] weeks gestation of pregnancy  Pregnancy related bilateral lower abdominal cramping, antepartum  Acute midline low back pain without sciatica  Breast tenderness in female  Bacterial vaginosis         NEW MEDICATIONS STARTED DURING THIS VISIT:  New Prescriptions   METRONIDAZOLE (FLAGYL) 500 MG TABLET    Take 1 tablet (500 mg total) by mouth 2 (two) times daily.   ONDANSETRON (ZOFRAN ODT) 4 MG DISINTEGRATING TABLET    Take 1 tablet (4 mg total) by mouth every 8 (eight) hours as needed for nausea or vomiting.   PRENATAL MULTIVIT-MIN-FE-FA (PRENATAL  VITAMINS) 0.8 MG TABLET    Take 1 tablet by mouth daily.      Eula Listen, MD 01/23/17 2150

## 2017-01-23 NOTE — ED Notes (Signed)
Pt. States lower abdominal cramping that radiates to back for the past couple nights.  Pt. Unsure if she is pregnant. Pt. States increase in urine frequency, but denies dysuria or discharge.

## 2017-01-24 ENCOUNTER — Telehealth: Payer: Self-pay | Admitting: Obstetrics and Gynecology

## 2017-01-24 NOTE — Telephone Encounter (Signed)
Pt called to req er fu appt with Dr Marcelline Mates. Returned call to pt lvm to call us to sched appt with Dr Amalia Hailey this week. Pt states she went to ER yesterday and was told to f/u in 2 days with OBGYN for gyn. Pt states LMP 2/20 EDC 11/30. 6 WK gest.

## 2017-01-26 ENCOUNTER — Encounter: Payer: Self-pay | Admitting: Obstetrics and Gynecology

## 2017-01-26 ENCOUNTER — Other Ambulatory Visit: Payer: Self-pay

## 2017-01-26 ENCOUNTER — Ambulatory Visit (INDEPENDENT_AMBULATORY_CARE_PROVIDER_SITE_OTHER): Payer: Self-pay | Admitting: Obstetrics and Gynecology

## 2017-01-26 VITALS — BP 108/69 | HR 71 | Ht 62.0 in | Wt 123.3 lb

## 2017-01-26 DIAGNOSIS — N926 Irregular menstruation, unspecified: Secondary | ICD-10-CM

## 2017-01-26 NOTE — Progress Notes (Signed)
HPI:      [name] is a 21 y.o. G1P0 who LMP was Patient's last menstrual period was 12/21/2016 (approximate).  Subjective:   She presents today As an ED follow-up. She presented there with complaint of a missed menstrual period and pelvic cramping. An ultrasound performed revealed no evidence of pregnancy. Her Quant was 618.  She has experienced no bleeding. She has mild cramping but this has mostly resolved.    Hx: The following portions of the patient's history were reviewed and updated as appropriate:              She  has no past medical history on file. She  does not have any pertinent problems on file. She  has a past surgical history that includes Breast surgery (Left, 06-12-13). Her family history includes Breast cancer in her other and paternal aunt. She  reports that she has quit smoking. Her smoking use included Cigarettes. She smoked 0.25 packs per day. She has never used smokeless tobacco. She reports that she does not drink alcohol or use drugs. Current Outpatient Prescriptions on File Prior to Visit  Medication Sig Dispense Refill  . Prenatal Multivit-Min-Fe-FA (PRENATAL VITAMINS) 0.8 MG tablet Take 1 tablet by mouth daily. 30 tablet 0  . Alum & Mag Hydroxide-Simeth (MAGIC MOUTHWASH W/LIDOCAINE) SOLN Take 5 mLs by mouth 4 (four) times daily. (Patient not taking: Reported on 01/26/2017) 100 mL 0  . metroNIDAZOLE (FLAGYL) 500 MG tablet Take 1 tablet (500 mg total) by mouth 2 (two) times daily. (Patient not taking: Reported on 01/26/2017) 14 tablet 0  . nystatin (MYCOSTATIN) 100000 UNIT/ML suspension Take 5 mLs (500,000 Units total) by mouth 4 (four) times daily. (Patient not taking: Reported on 01/26/2017) 120 mL 0  . ondansetron (ZOFRAN ODT) 4 MG disintegrating tablet Take 1 tablet (4 mg total) by mouth every 8 (eight) hours as needed for nausea or vomiting. (Patient not taking: Reported on 01/26/2017) 10 tablet 0   No current facility-administered medications on file prior  to visit.          Review of Systems:  Review of Systems  Constitutional: Denied constitutional symptoms, night sweats, recent illness, fatigue, fever, insomnia and weight loss.  Eyes: Denied eye symptoms, eye pain, photophobia, vision change and visual disturbance.  Ears/Nose/Throat/Neck: Denied ear, nose, throat or neck symptoms, hearing loss, nasal discharge, sinus congestion and sore throat.  Cardiovascular: Denied cardiovascular symptoms, arrhythmia, chest pain/pressure, edema, exercise intolerance, orthopnea and palpitations.  Respiratory: Denied pulmonary symptoms, asthma, pleuritic pain, productive sputum, cough, dyspnea and wheezing.  Gastrointestinal: Denied, gastro-esophageal reflux, melena, nausea and vomiting.  Genitourinary: Denied genitourinary symptoms including symptomatic vaginal discharge, pelvic relaxation issues, and urinary complaints.  Musculoskeletal: Denied musculoskeletal symptoms, stiffness, swelling, muscle weakness and myalgia.  Dermatologic: Denied dermatology symptoms, rash and scar.  Neurologic: Denied neurology symptoms, dizziness, headache, neck pain and syncope.  Psychiatric: Denied psychiatric symptoms, anxiety and depression.  Endocrine: Denied endocrine symptoms including hot flashes and night sweats.   Meds:   Current Outpatient Prescriptions on File Prior to Visit  Medication Sig Dispense Refill  . Prenatal Multivit-Min-Fe-FA (PRENATAL VITAMINS) 0.8 MG tablet Take 1 tablet by mouth daily. 30 tablet 0  . Alum & Mag Hydroxide-Simeth (MAGIC MOUTHWASH W/LIDOCAINE) SOLN Take 5 mLs by mouth 4 (four) times daily. (Patient not taking: Reported on 01/26/2017) 100 mL 0  . metroNIDAZOLE (FLAGYL) 500 MG tablet Take 1 tablet (500 mg total) by mouth 2 (two) times daily. (Patient not taking: Reported on 01/26/2017)  14 tablet 0  . nystatin (MYCOSTATIN) 100000 UNIT/ML suspension Take 5 mLs (500,000 Units total) by mouth 4 (four) times daily. (Patient not taking:  Reported on 01/26/2017) 120 mL 0  . ondansetron (ZOFRAN ODT) 4 MG disintegrating tablet Take 1 tablet (4 mg total) by mouth every 8 (eight) hours as needed for nausea or vomiting. (Patient not taking: Reported on 01/26/2017) 10 tablet 0   No current facility-administered medications on file prior to visit.     Objective:     Vitals:   01/26/17 1115  BP: 108/69  Pulse: 71              Ultrasound results reviewed directly with the patient.  Assessment:    G1P0 Patient Active Problem List   Diagnosis Date Noted  . Lump or mass in breast 05/30/2013  . 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 Missed menses     Unsure of pregnancy location. Possible early intrauterine pregnancy versus ectopic versus missed AB.   Plan:            1.  Quantitative beta hCG today.  2.  Ultrasound next Tuesday.  3.  SAb I have discussed the possibility of miscarriage with the patient.  I have informed her that vaginal bleeding, cramping or passage of tissue are the most common signs of miscarriage.  Should she have heavy bleeding, pass tissue or have other problems, she has been informed to call the office immediately.  I have advised her to remain at pelvic rest for at least one week after her last episode of bleeding.  If she is currently working, I have instructed her to discontinue until this situation resolves.  Complete versus incomplete miscarriage was discussed and the patient is aware that should she develop heavy bleeding, fever, or persistent crampy pelvic pain she may require a D & E to remove the remaining portion of the miscarried pregnancy.  I advised her to keep me informed should her condition change. Ectopic pregnancy The possibility of ectopic pregnancy was discussed.  The patient was instructed to call immediately if she had any of the following signs of an ectopic pregnancy:  Persistent pelvic pain, light-headedness or general change in her current condition.  She has been advised to  not spend long periods of time alone and to follow-up as directed.    Orders Orders Placed This Encounter  Procedures  . US OB Comp Less 14 Wks  . hCG, quantitative, pregnancy    No orders of the defined types were placed in this encounter.       F/U  Return in about 6 days (around 02/01/2017). I spent 31 minutes with this patient of which greater than 50% was spent discussing ectopic pregnancy, ultrasound findings, quantitative beta hCG findings, future workup, warnings for ectopic pregnancy or miscarriage.  Finis Bud, M.D. 01/26/2017 11:55 AM

## 2017-01-26 NOTE — Addendum Note (Signed)
Addended by: Raliegh Ip on: 01/26/2017 01:31 PM   Modules accepted: Orders

## 2017-01-27 ENCOUNTER — Telehealth: Payer: Self-pay

## 2017-01-27 LAB — BETA HCG QUANT (REF LAB): hCG Quant: 1716 m[IU]/mL

## 2017-01-27 NOTE — Telephone Encounter (Signed)
-----   Message from Harlin Heys, MD sent at 01/27/2017  9:21 AM EDT ----- Rising Quants - U/S scheduled for next Tues.

## 2017-01-27 NOTE — Telephone Encounter (Signed)
Pt infromed of results pre provider. Encouraged to keep U/S appt on 02/01/17

## 2017-02-01 ENCOUNTER — Ambulatory Visit (INDEPENDENT_AMBULATORY_CARE_PROVIDER_SITE_OTHER): Payer: Self-pay

## 2017-02-01 ENCOUNTER — Ambulatory Visit (INDEPENDENT_AMBULATORY_CARE_PROVIDER_SITE_OTHER): Payer: Self-pay | Admitting: Obstetrics and Gynecology

## 2017-02-01 ENCOUNTER — Encounter: Payer: Self-pay | Admitting: Obstetrics and Gynecology

## 2017-02-01 VITALS — BP 94/56 | HR 74 | Wt 124.1 lb

## 2017-02-01 DIAGNOSIS — Z3491 Encounter for supervision of normal pregnancy, unspecified, first trimester: Secondary | ICD-10-CM

## 2017-02-01 DIAGNOSIS — N926 Irregular menstruation, unspecified: Secondary | ICD-10-CM

## 2017-02-01 LAB — POCT URINALYSIS DIPSTICK
Bilirubin, UA: NEGATIVE
Blood, UA: NEGATIVE
Glucose, UA: NEGATIVE
LEUKOCYTES UA: NEGATIVE
NITRITE UA: NEGATIVE
PH UA: 5 (ref 5.0–8.0)
Spec Grav, UA: 1.025 (ref 1.030–1.035)
Urobilinogen, UA: NEGATIVE (ref ?–2.0)

## 2017-02-01 NOTE — Progress Notes (Signed)
HPI:      [name] is a 21 y.o. G1P0 who LMP was Patient's last menstrual period was 12/21/2016 (approximate).  Subjective:   She presents today For follow-up after her ultrasound. She had a positive patency test but the location of the pregnancy was unknown. An ultrasound revealed nothing in the adnexa and nothing in the uterus. Quantitative beta hCGs were drawn and they doubled appropriately. Today she had a follow-up ultrasound. She has no abdominal pain or symptoms of problems.    Hx: The following portions of the patient's history were reviewed and updated as appropriate:              She  has no past medical history on file. Current Outpatient Prescriptions on File Prior to Visit  Medication Sig Dispense Refill  . ondansetron (ZOFRAN ODT) 4 MG disintegrating tablet Take 1 tablet (4 mg total) by mouth every 8 (eight) hours as needed for nausea or vomiting. 10 tablet 0  . Prenatal Multivit-Min-Fe-FA (PRENATAL VITAMINS) 0.8 MG tablet Take 1 tablet by mouth daily. 30 tablet 0  . Alum & Mag Hydroxide-Simeth (MAGIC MOUTHWASH W/LIDOCAINE) SOLN Take 5 mLs by mouth 4 (four) times daily. (Patient not taking: Reported on 01/26/2017) 100 mL 0  . nystatin (MYCOSTATIN) 100000 UNIT/ML suspension Take 5 mLs (500,000 Units total) by mouth 4 (four) times daily. (Patient not taking: Reported on 01/26/2017) 120 mL 0   No current facility-administered medications on file prior to visit.          Review of Systems:  Review of Systems  Constitutional: Denied constitutional symptoms, night sweats, recent illness, fatigue, fever, insomnia and weight loss.  Eyes: Denied eye symptoms, eye pain, photophobia, vision change and visual disturbance.  Ears/Nose/Throat/Neck: Denied ear, nose, throat or neck symptoms, hearing loss, nasal discharge, sinus congestion and sore throat.  Cardiovascular: Denied cardiovascular symptoms, arrhythmia, chest pain/pressure, edema, exercise intolerance, orthopnea and  palpitations.  Respiratory: Denied pulmonary symptoms, asthma, pleuritic pain, productive sputum, cough, dyspnea and wheezing.  Gastrointestinal: Denied, gastro-esophageal reflux, melena, nausea and vomiting.  Genitourinary: Denied genitourinary symptoms including symptomatic vaginal discharge, pelvic relaxation issues, and urinary complaints.  Musculoskeletal: Denied musculoskeletal symptoms, stiffness, swelling, muscle weakness and myalgia.  Dermatologic: Denied dermatology symptoms, rash and scar.  Neurologic: Denied neurology symptoms, dizziness, headache, neck pain and syncope.  Psychiatric: Denied psychiatric symptoms, anxiety and depression.  Endocrine: Denied endocrine symptoms including hot flashes and night sweats.   Meds:   Current Outpatient Prescriptions on File Prior to Visit  Medication Sig Dispense Refill  . ondansetron (ZOFRAN ODT) 4 MG disintegrating tablet Take 1 tablet (4 mg total) by mouth every 8 (eight) hours as needed for nausea or vomiting. 10 tablet 0  . Prenatal Multivit-Min-Fe-FA (PRENATAL VITAMINS) 0.8 MG tablet Take 1 tablet by mouth daily. 30 tablet 0  . Alum & Mag Hydroxide-Simeth (MAGIC MOUTHWASH W/LIDOCAINE) SOLN Take 5 mLs by mouth 4 (four) times daily. (Patient not taking: Reported on 01/26/2017) 100 mL 0  . nystatin (MYCOSTATIN) 100000 UNIT/ML suspension Take 5 mLs (500,000 Units total) by mouth 4 (four) times daily. (Patient not taking: Reported on 01/26/2017) 120 mL 0   No current facility-administered medications on file prior to visit.     Objective:     Vitals:   02/01/17 1603  BP: (!) 94/56  Pulse: 74              Ultrasound results reviewed directly with the patient.  Assessment:    G1P0 Patient Active Problem  List   Diagnosis Date Noted  . Lump or mass in breast 05/30/2013  . 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 Prenatal care in first trimester     Intrauterine gestational sac noted. Quants doubling appropriately.  Suspected  viable intrauterine pregnancy.   Plan:            1.  I reassured her regarding the pregnancy.  2.  Scheduled for nurse visit  3.  Follow-up ultrasound 1 week expect crown-rump length with heart tones.  4.  Follow-up prenatal physical exam 5 weeks Orders Orders Placed This Encounter  Procedures  . POCT urinalysis dipstick    No orders of the defined types were placed in this encounter.       F/U  Return in about 5 weeks (around 03/08/2017).  Finis Bud, M.D. 02/01/2017 4:34 PM

## 2017-02-02 ENCOUNTER — Other Ambulatory Visit: Payer: Self-pay

## 2017-02-02 ENCOUNTER — Other Ambulatory Visit: Payer: Self-pay | Admitting: Obstetrics and Gynecology

## 2017-02-02 DIAGNOSIS — Z369 Encounter for antenatal screening, unspecified: Secondary | ICD-10-CM

## 2017-02-08 ENCOUNTER — Ambulatory Visit (INDEPENDENT_AMBULATORY_CARE_PROVIDER_SITE_OTHER): Payer: Self-pay

## 2017-02-08 ENCOUNTER — Other Ambulatory Visit: Payer: Self-pay

## 2017-02-08 DIAGNOSIS — Z369 Encounter for antenatal screening, unspecified: Secondary | ICD-10-CM

## 2017-02-22 ENCOUNTER — Ambulatory Visit: Payer: Medicaid Other

## 2017-02-22 VITALS — BP 110/78 | HR 98 | Wt 126.0 lb

## 2017-02-22 DIAGNOSIS — Z3A08 8 weeks gestation of pregnancy: Secondary | ICD-10-CM

## 2017-02-22 NOTE — Progress Notes (Unsigned)
Kathleen Glass presents for NOB nurse interview visit. Pregnancy confirmation done here at [facility]. G-1 .  P-0 . Pregnancy education material explained and given._No cats in the home. NOB labs ordered. Marland Kitchen HIV labs and Drug screen were explained optional and she did not decline. Drug screen ordered. PNV encouraged. Genetic screening options discussed. Genetic testing:/Unsure.  Pt may discuss with provider. Pt. To follow up with provider in _3_ weeks for NOB physical.  All questions answered.

## 2017-02-23 LAB — CBC WITH DIFFERENTIAL/PLATELET
Basophils Absolute: 0 10*3/uL (ref 0.0–0.2)
Basos: 0 %
EOS (ABSOLUTE): 0.1 10*3/uL (ref 0.0–0.4)
EOS: 1 %
HEMATOCRIT: 36.1 % (ref 34.0–46.6)
HEMOGLOBIN: 11.7 g/dL (ref 11.1–15.9)
Immature Grans (Abs): 0 10*3/uL (ref 0.0–0.1)
Immature Granulocytes: 0 %
LYMPHS ABS: 2.9 10*3/uL (ref 0.7–3.1)
Lymphs: 33 %
MCH: 26.4 pg — AB (ref 26.6–33.0)
MCHC: 32.4 g/dL (ref 31.5–35.7)
MCV: 81 fL (ref 79–97)
MONOCYTES: 9 %
MONOS ABS: 0.7 10*3/uL (ref 0.1–0.9)
NEUTROS ABS: 5 10*3/uL (ref 1.4–7.0)
Neutrophils: 57 %
Platelets: 288 10*3/uL (ref 150–379)
RBC: 4.44 x10E6/uL (ref 3.77–5.28)
RDW: 13.5 % (ref 12.3–15.4)
WBC: 8.7 10*3/uL (ref 3.4–10.8)

## 2017-02-23 LAB — ABO AND RH: Rh Factor: POSITIVE

## 2017-02-23 LAB — URINALYSIS, ROUTINE W REFLEX MICROSCOPIC
BILIRUBIN UA: NEGATIVE
Glucose, UA: NEGATIVE
Ketones, UA: NEGATIVE
Leukocytes, UA: NEGATIVE
NITRITE UA: NEGATIVE
Protein, UA: NEGATIVE
RBC UA: NEGATIVE
Specific Gravity, UA: 1.027 (ref 1.005–1.030)
UUROB: 0.2 mg/dL (ref 0.2–1.0)
pH, UA: 6.5 (ref 5.0–7.5)

## 2017-02-23 LAB — VARICELLA ZOSTER ANTIBODY, IGG: VARICELLA: 711 {index} (ref >165)

## 2017-02-23 LAB — MONITOR DRUG PROFILE 14(MW)
AMPHETAMINE SCREEN URINE: NEGATIVE ng/mL
BARBITURATE SCREEN URINE: NEGATIVE ng/mL
BENZODIAZEPINE SCREEN, URINE: NEGATIVE ng/mL
BUPRENORPHINE, URINE: NEGATIVE ng/mL
CANNABINOIDS UR QL SCN: NEGATIVE ng/mL
Cocaine (Metab) Scrn, Ur: NEGATIVE ng/mL
Creatinine(Crt), U: 137.9 mg/dL (ref 20.0–300.0)
Fentanyl, Urine: NEGATIVE pg/mL
Meperidine Screen, Urine: NEGATIVE ng/mL
Methadone Screen, Urine: NEGATIVE ng/mL
OXYCODONE+OXYMORPHONE UR QL SCN: NEGATIVE ng/mL
Opiate Scrn, Ur: NEGATIVE ng/mL
PH UR, DRUG SCRN: 6.1 (ref 4.5–8.9)
PROPOXYPHENE SCREEN URINE: NEGATIVE ng/mL
Phencyclidine Qn, Ur: NEGATIVE ng/mL
SPECIFIC GRAVITY: 1.031
Tramadol Screen, Urine: NEGATIVE ng/mL

## 2017-02-23 LAB — HEPATITIS B SURFACE ANTIGEN: Hepatitis B Surface Ag: NEGATIVE

## 2017-02-23 LAB — HIV ANTIBODY (ROUTINE TESTING W REFLEX): HIV Screen 4th Generation wRfx: NONREACTIVE

## 2017-02-23 LAB — RPR: RPR Ser Ql: NONREACTIVE

## 2017-02-23 LAB — RUBELLA SCREEN: Rubella Antibodies, IGG: 1.15 index (ref >0.99)

## 2017-02-23 LAB — ANTIBODY SCREEN: Antibody Screen: NEGATIVE

## 2017-02-24 LAB — URINE CULTURE

## 2017-02-24 LAB — GC/CHLAMYDIA PROBE AMP
Chlamydia trachomatis, NAA: NEGATIVE
Neisseria gonorrhoeae by PCR: NEGATIVE

## 2017-02-25 ENCOUNTER — Encounter: Payer: Self-pay | Admitting: Certified Nurse Midwife

## 2017-03-08 ENCOUNTER — Encounter: Payer: Self-pay | Admitting: Obstetrics and Gynecology

## 2017-03-16 ENCOUNTER — Encounter: Payer: Self-pay | Admitting: Certified Nurse Midwife

## 2017-03-16 ENCOUNTER — Ambulatory Visit (INDEPENDENT_AMBULATORY_CARE_PROVIDER_SITE_OTHER): Payer: Medicaid Other | Admitting: Certified Nurse Midwife

## 2017-03-16 VITALS — BP 100/76 | HR 83 | Wt 122.7 lb

## 2017-03-16 DIAGNOSIS — Z3401 Encounter for supervision of normal first pregnancy, first trimester: Secondary | ICD-10-CM | POA: Diagnosis not present

## 2017-03-16 LAB — POCT URINALYSIS DIPSTICK
Bilirubin, UA: NEGATIVE
Blood, UA: NEGATIVE
Glucose, UA: NEGATIVE
KETONES UA: NEGATIVE
LEUKOCYTES UA: NEGATIVE
Nitrite, UA: NEGATIVE
PH UA: 8 (ref 5.0–8.0)
PROTEIN UA: NEGATIVE
Spec Grav, UA: 1.01 (ref 1.010–1.025)
UROBILINOGEN UA: 0.2 U/dL

## 2017-03-16 NOTE — Patient Instructions (Signed)

## 2017-03-16 NOTE — Progress Notes (Signed)
NEW OB HISTORY AND PHYSICAL  SUBJECTIVE:       Kathleen Glass is a 21 y.o. G1P0 female, Patient's last menstrual period was 12/27/2016., Estimated Date of Delivery: 10/03/17, [redacted]w[redacted]d, presents today for establishment of Prenatal Care. She has no unusual complaints .    Gynecologic History Patient's last menstrual period was 12/27/2016. Normal Contraception: none Last Pap: N/A  Obstetric History OB History  Gravida Para Term Preterm AB Living  1            SAB TAB Ectopic Multiple Live Births               # Outcome Date GA Lbr Len/2nd Weight Sex Delivery Anes PTL Lv  1 Current             Obstetric Comments  1st Menstrual Cycle: 12    History reviewed. No pertinent past medical history.  Past Surgical History:  Procedure Laterality Date  . BREAST SURGERY Left 06-12-13   benign fibroadenoma    Current Outpatient Prescriptions on File Prior to Visit  Medication Sig Dispense Refill  . ondansetron (ZOFRAN ODT) 4 MG disintegrating tablet Take 1 tablet (4 mg total) by mouth every 8 (eight) hours as needed for nausea or vomiting. 10 tablet 0  . Prenatal Multivit-Min-Fe-FA (PRENATAL VITAMINS) 0.8 MG tablet Take 1 tablet by mouth daily. 30 tablet 0   No current facility-administered medications on file prior to visit.     No Known Allergies  Social History   Social History  . Marital status: Single    Spouse name: N/A  . Number of children: N/A  . Years of education: N/A   Occupational History  . Not on file.   Social History Main Topics  . Smoking status: Former Smoker    Packs/day: 0.25    Types: Cigarettes  . Smokeless tobacco: Never Used  . Alcohol use No  . Drug use: No  . Sexual activity: Yes    Birth control/ protection: None   Other Topics Concern  . Not on file   Social History Narrative  . No narrative on file    Family History  Problem Relation Age of Onset  . Breast cancer Other   . Breast cancer Paternal Aunt        great aunt    The  following portions of the patient's history were reviewed and updated as appropriate: allergies, current medications, past OB history, past medical history, past surgical history, past family history, past social history, and problem list.    OBJECTIVE: Initial Physical Exam (New OB) GENERAL APPEARANCE: alert, well appearing, in no apparent distress, oriented to person, place and time HEAD: normocephalic, atraumatic MOUTH: mucous membranes moist, pharynx normal without lesions THYROID: no thyromegaly or masses present BREASTS: no masses noted, no significant tenderness, no palpable axillary nodes, no skin changes, fibrocystic changes, fibrocystic breast. Scar on left breast from cyst removal. LUNGS: clear to auscultation, no wheezes, rales or rhonchi, symmetric air entry HEART: regular rate and rhythm, no murmurs ABDOMEN: soft, nontender, nondistended, no abnormal masses, no epigastric pain EXTREMITIES: no redness or tenderness in the calves or thighs SKIN: normal coloration and turgor, no rashes LYMPH NODES: no adenopathy palpable NEUROLOGIC: alert, oriented, normal speech, no focal findings or movement disorder noted  PELVIC EXAM EXTERNAL GENITALIA: normal appearing vulva with no masses, tenderness or lesions VAGINA: no abnormal discharge or lesions CERVIX: no lesions or cervical motion tenderness UTERUS: gravid ADNEXA: no masses palpable and nontender OB EXAM PELVIMETRY: appears  adequate RECTUM: exam not indicated  ASSESSMENT: Normal pregnancy  PLAN: New OB counseling: The patient has been given an overview regarding routine prenatal care. Recommendations regarding diet, weight gain, and exercise in pregnancy were given. Prenatal testing, optional genetic testing . Will have Maternit today. Ultrasound use in pregnancy were reviewed. The patient is encouraged to consider nursing her baby post partum. ROB in 4 wks.   Philip Aspen, CNM

## 2017-03-20 LAB — MATERNIT 21 PLUS CORE, BLOOD
CHROMOSOME 18: NEGATIVE
Chromosome 13: NEGATIVE
Chromosome 21: NEGATIVE
Y Chromosome: NOT DETECTED

## 2017-03-21 ENCOUNTER — Telehealth: Payer: Self-pay | Admitting: Obstetrics and Gynecology

## 2017-03-21 ENCOUNTER — Emergency Department
Admission: EM | Admit: 2017-03-21 | Discharge: 2017-03-22 | Disposition: A | Discharge status: Home / Self Care | Payer: Medicaid Other | Source: Home / Self Care

## 2017-03-21 DIAGNOSIS — O26891 Other specified pregnancy related conditions, first trimester: Secondary | ICD-10-CM | POA: Diagnosis not present

## 2017-03-21 DIAGNOSIS — R109 Unspecified abdominal pain: Secondary | ICD-10-CM | POA: Diagnosis not present

## 2017-03-21 DIAGNOSIS — F17210 Nicotine dependence, cigarettes, uncomplicated: Secondary | ICD-10-CM | POA: Insufficient documentation

## 2017-03-21 DIAGNOSIS — R197 Diarrhea, unspecified: Secondary | ICD-10-CM | POA: Diagnosis not present

## 2017-03-21 DIAGNOSIS — O219 Vomiting of pregnancy, unspecified: Secondary | ICD-10-CM | POA: Insufficient documentation

## 2017-03-21 DIAGNOSIS — R509 Fever, unspecified: Secondary | ICD-10-CM | POA: Insufficient documentation

## 2017-03-21 DIAGNOSIS — Z5321 Procedure and treatment not carried out due to patient leaving prior to being seen by health care provider: Secondary | ICD-10-CM | POA: Insufficient documentation

## 2017-03-21 DIAGNOSIS — Z87891 Personal history of nicotine dependence: Secondary | ICD-10-CM | POA: Diagnosis not present

## 2017-03-21 DIAGNOSIS — Z3A12 12 weeks gestation of pregnancy: Secondary | ICD-10-CM

## 2017-03-21 DIAGNOSIS — O99331 Smoking (tobacco) complicating pregnancy, first trimester: Secondary | ICD-10-CM | POA: Insufficient documentation

## 2017-03-21 LAB — URINALYSIS, COMPLETE (UACMP) WITH MICROSCOPIC
BILIRUBIN URINE: NEGATIVE
Bacteria, UA: NONE SEEN
Glucose, UA: NEGATIVE mg/dL
HGB URINE DIPSTICK: NEGATIVE
Ketones, ur: 20 mg/dL — AB
LEUKOCYTES UA: NEGATIVE
Nitrite: NEGATIVE
PROTEIN: 30 mg/dL — AB
Specific Gravity, Urine: 1.039 — ABNORMAL HIGH (ref 1.005–1.030)
pH: 5 (ref 5.0–8.0)

## 2017-03-21 LAB — COMPREHENSIVE METABOLIC PANEL
ALBUMIN: 3.7 g/dL (ref 3.5–5.0)
ALT: 19 U/L (ref 14–54)
AST: 24 U/L (ref 15–41)
Alkaline Phosphatase: 45 U/L (ref 38–126)
Anion gap: 6 (ref 5–15)
BUN: 14 mg/dL (ref 6–20)
CHLORIDE: 106 mmol/L (ref 101–111)
CO2: 23 mmol/L (ref 22–32)
Calcium: 8.9 mg/dL (ref 8.9–10.3)
Creatinine, Ser: 0.5 mg/dL (ref 0.44–1.00)
GFR calc Af Amer: >60 mL/min (ref >60)
GFR calc non Af Amer: >60 mL/min (ref >60)
GLUCOSE: 90 mg/dL (ref 65–99)
POTASSIUM: 3.4 mmol/L — AB (ref 3.5–5.1)
Sodium: 135 mmol/L (ref 135–145)
Total Bilirubin: 0.4 mg/dL (ref 0.3–1.2)
Total Protein: 6.9 g/dL (ref 6.5–8.1)

## 2017-03-21 LAB — CBC
HCT: 36.2 % (ref 35.0–47.0)
Hemoglobin: 12.2 g/dL (ref 12.0–16.0)
MCH: 26.6 pg (ref 26.0–34.0)
MCHC: 33.5 g/dL (ref 32.0–36.0)
MCV: 79.3 fL — ABNORMAL LOW (ref 80.0–100.0)
PLATELETS: 251 10*3/uL (ref 150–440)
RBC: 4.57 MIL/uL (ref 3.80–5.20)
RDW: 13.1 % (ref 11.5–14.5)
WBC: 10.7 10*3/uL (ref 3.6–11.0)

## 2017-03-21 NOTE — Telephone Encounter (Signed)
Patient called wanting to know results from "genetic testing". The patient disclosed that she was not able to see results in [place], and wanted to know the results so "family could plan a gender reveal party." Thank you.

## 2017-03-21 NOTE — ED Triage Notes (Signed)
Pt presents to ED c/o fever today 99.8 and lower with emesis, chills and flu like symptoms. Pt is [redacted] weeks pregnant and doesn't know if this is all related.

## 2017-03-22 ENCOUNTER — Emergency Department
Admission: EM | Admit: 2017-03-22 | Discharge: 2017-03-22 | Disposition: A | Discharge status: Home / Self Care | Payer: Medicaid Other | Attending: Emergency Medicine | Admitting: Emergency Medicine

## 2017-03-22 ENCOUNTER — Encounter: Payer: Self-pay | Admitting: Certified Nurse Midwife

## 2017-03-22 ENCOUNTER — Encounter: Payer: Self-pay | Admitting: Emergency Medicine

## 2017-03-22 DIAGNOSIS — O219 Vomiting of pregnancy, unspecified: Secondary | ICD-10-CM | POA: Insufficient documentation

## 2017-03-22 DIAGNOSIS — Z87891 Personal history of nicotine dependence: Secondary | ICD-10-CM | POA: Insufficient documentation

## 2017-03-22 DIAGNOSIS — R197 Diarrhea, unspecified: Secondary | ICD-10-CM | POA: Insufficient documentation

## 2017-03-22 DIAGNOSIS — R109 Unspecified abdominal pain: Secondary | ICD-10-CM | POA: Insufficient documentation

## 2017-03-22 DIAGNOSIS — O26891 Other specified pregnancy related conditions, first trimester: Secondary | ICD-10-CM | POA: Insufficient documentation

## 2017-03-22 DIAGNOSIS — Z3A12 12 weeks gestation of pregnancy: Secondary | ICD-10-CM | POA: Insufficient documentation

## 2017-03-22 DIAGNOSIS — O26899 Other specified pregnancy related conditions, unspecified trimester: Secondary | ICD-10-CM

## 2017-03-22 NOTE — Telephone Encounter (Signed)
Called pt put results in envelope

## 2017-03-22 NOTE — Discharge Instructions (Signed)
Please seek medical attention for any high fevers, chest pain, shortness of breath, change in behavior, persistent vomiting, bloody stool or any other new or concerning symptoms.  

## 2017-03-22 NOTE — ED Notes (Signed)
FN: pt was here last night for same and left due to wait times. Pt did have labs  Completed last night.

## 2017-03-22 NOTE — ED Triage Notes (Signed)
Pt here last night for same, abd pain and is [redacted] weeks pregnant, have to leave last night due to wait, had labs completed.

## 2017-03-22 NOTE — ED Provider Notes (Signed)
[facility] Emergency Department Provider Note   ____________________________________________   I have reviewed the triage vital signs and the nursing notes.   HISTORY  Chief Complaint Abdominal Pain   History limited by: Not Limited   HPI Kathleen Glass is a 21 y.o. female who presents to the emergency department today because of concerns for abdominal pain in pregnancy. The patient was actually in the emergency department yesterday evening for this. She described the pain as being severe. It was accompanied by nausea and vomiting. She did have some diarrhea. She states that her husband had similar symptoms and she thinks she got it from him. She did leave prior to getting the results of her testing yesterday. Today the patient states she feels much better. The pain is improved. She has been able to eat and drink. She just wanted to make sure everything was okay.   History reviewed. No pertinent past medical history.  Patient Active Problem List   Diagnosis Date Noted  . Lump or mass in breast 05/30/2013  . 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 BREAST SURGERY Left 06-12-13   benign fibroadenoma    Prior to Admission medications   Medication Sig Start Date End Date Taking? Authorizing Provider  ondansetron (ZOFRAN ODT) 4 MG disintegrating tablet Take 1 tablet (4 mg total) by mouth every 8 (eight) hours as needed for nausea or vomiting. 01/23/17   Eula Listen, MD  Prenatal Multivit-Min-Fe-FA (PRENATAL VITAMINS) 0.8 MG tablet Take 1 tablet by mouth daily. 01/23/17   Eula Listen, MD    Allergies Patient has no known allergies.  Family History  Problem Relation Age of Onset  . Breast cancer Other   . Breast cancer Paternal Aunt        great aunt    Social History Social History  Substance Use Topics  . Smoking status: Former Smoker    Packs/day: 0.25    Types: Cigarettes  .  Smokeless tobacco: Never Used  . Alcohol use No    Review of Systems Constitutional: No fever/chills Eyes: No visual changes. ENT: No sore throat. Cardiovascular: Denies chest pain. Respiratory: Denies shortness of breath. Gastrointestinal: Positive for abdominal pain. Genitourinary: Negative for dysuria. Musculoskeletal: Negative for back pain. Skin: Negative for rash. Neurological: Negative for headaches, focal weakness or numbness.  ____________________________________________   PHYSICAL EXAM:  VITAL SIGNS: ED Triage Vitals [03/22/17 1518]  Enc Vitals Group     BP 99/86     Pulse Rate 81     Resp 14     Temp 98.7 F (37.1 C)     Temp Source Oral     SpO2 99 %    Constitutional: Alert and oriented. Well appearing and in no distress. Eyes: Conjunctivae are normal.  ENT   Head: Normocephalic and atraumatic.   Nose: No congestion/rhinnorhea.   Mouth/Throat: Mucous membranes are moist.   Neck: No stridor. Hematological/Lymphatic/Immunilogical: No cervical lymphadenopathy. Cardiovascular: Normal rate, regular rhythm.  No murmurs, rubs, or gallops.  Respiratory: Normal respiratory effort without tachypnea nor retractions. Breath sounds are clear and equal bilaterally. No wheezes/rales/rhonchi. Gastrointestinal: Soft and non tender. No rebound. No guarding.  Genitourinary: Deferred Musculoskeletal: Normal range of motion in all extremities. No lower extremity edema. Neurologic:  Normal speech and language. No gross focal neurologic deficits are appreciated.  Skin:  Skin is warm, dry and intact. No rash noted. Psychiatric: Mood and affect are normal. Speech and behavior are normal.  Patient exhibits appropriate insight and judgment.  ____________________________________________    LABS (pertinent positives/negatives)  Labs from last night  reviewed.  ____________________________________________   EKG  None  ____________________________________________    RADIOLOGY  None   ____________________________________________   PROCEDURES  Procedures  ____________________________________________   INITIAL IMPRESSION / ASSESSMENT AND PLAN / ED COURSE  Pertinent labs & imaging results that were available during my care of the patient were reviewed by me and considered in my medical decision making (see chart for details).  Patient presents to the emergency department today concerned for results from testing done last night. Testing from yesterday's visit without any concerning findings. Additionally patient feels much improved today. At this point feel patient safe for discharge to follow up with outpatient doctors.  ____________________________________________   FINAL CLINICAL IMPRESSION(S) / ED DIAGNOSES  Final diagnoses:  Abdominal pain during pregnancy, antepartum     Note: This dictation was prepared with Dragon dictation. Any transcriptional errors that result from this process are unintentional     Nance Pear, MD 03/22/17 1620

## 2017-03-22 NOTE — ED Notes (Signed)
Pt in via triage, reports N/V all day yesterday, denies diarrhea, to ED last night but left prior to being seen.  Pt states, "I came back to find out if anything is wrong."  Pt states she is [redacted] weeks pregnant, pt denies any abdominal pain.  NAD noted at this time.

## 2017-03-22 NOTE — ED Notes (Signed)
Fetal Heart Rate 164bpm

## 2017-03-29 ENCOUNTER — Encounter: Payer: Self-pay | Admitting: Certified Nurse Midwife

## 2017-04-13 ENCOUNTER — Ambulatory Visit (INDEPENDENT_AMBULATORY_CARE_PROVIDER_SITE_OTHER): Payer: Medicaid Other | Admitting: Obstetrics and Gynecology

## 2017-04-13 ENCOUNTER — Encounter: Payer: Self-pay | Admitting: Obstetrics and Gynecology

## 2017-04-13 VITALS — BP 108/64 | HR 68 | Wt 127.8 lb

## 2017-04-13 DIAGNOSIS — Z3492 Encounter for supervision of normal pregnancy, unspecified, second trimester: Secondary | ICD-10-CM

## 2017-04-13 LAB — POCT URINALYSIS DIPSTICK
BILIRUBIN UA: NEGATIVE
GLUCOSE UA: NEGATIVE
Ketones, UA: NEGATIVE
Leukocytes, UA: NEGATIVE
Nitrite, UA: NEGATIVE
Protein, UA: NEGATIVE
RBC UA: NEGATIVE
SPEC GRAV UA: 1.01 (ref 1.010–1.025)
Urobilinogen, UA: 0.2 E.U./dL
pH, UA: 7 (ref 5.0–8.0)

## 2017-04-13 NOTE — Progress Notes (Signed)
ROB- Feeling better, anatomy scan next visit.work FT at [place].

## 2017-04-13 NOTE — Progress Notes (Signed)
ROB- pt is doing well denies any complaints

## 2017-05-12 ENCOUNTER — Ambulatory Visit (INDEPENDENT_AMBULATORY_CARE_PROVIDER_SITE_OTHER): Payer: Medicaid Other | Admitting: Certified Nurse Midwife

## 2017-05-12 ENCOUNTER — Ambulatory Visit (INDEPENDENT_AMBULATORY_CARE_PROVIDER_SITE_OTHER): Payer: Medicaid Other

## 2017-05-12 VITALS — BP 96/64 | HR 73 | Wt 129.9 lb

## 2017-05-12 DIAGNOSIS — Z3492 Encounter for supervision of normal pregnancy, unspecified, second trimester: Secondary | ICD-10-CM

## 2017-05-12 DIAGNOSIS — Z3402 Encounter for supervision of normal first pregnancy, second trimester: Secondary | ICD-10-CM

## 2017-05-12 LAB — POCT URINALYSIS DIPSTICK
Bilirubin, UA: NEGATIVE
Blood, UA: NEGATIVE
Glucose, UA: NEGATIVE
KETONES UA: NEGATIVE
NITRITE UA: NEGATIVE
Protein, UA: NEGATIVE
SPEC GRAV UA: 1.015 (ref 1.010–1.025)
UROBILINOGEN UA: 0.2 U/dL
pH, UA: 7.5 (ref 5.0–8.0)

## 2017-05-12 NOTE — Patient Instructions (Signed)

## 2017-05-12 NOTE — Progress Notes (Signed)
ROB-Pt doing well, no questions or concerns. Anatomy scan today complete and normal. Findings reviewed with patient, verbalized understanding. Discussed round ligament pain and home treatment measures in pregnancy. Reviewed red flag symptoms and when to call. RTC x 4 weeks for ROB or sooner if needed.   ULTRASOUND REPORT  Location: [facility] Date of Service: 05/12/17  Indications:Anatomy U/S Findings:  Kathleen Glass intrauterine pregnancy is visualized with FHR at 155 BPM. Biometrics give an (U/S) Gestational age of 56 6/7 weeks and an (U/S) EDD of 11/30/1; this correlates with the clinically established EDD of 10/03/17.  Fetal presentation is Variable.  EFW: 298g (11oz). Placenta: posterior, grade 0, 3.6 cm from internal os.. AFI: Adequate with MVP of 3.6 cm.  Anatomic survey is complete and normal; Gender - female  .   Right Ovary measures 2.7 x 1.8 x 1.9 cm. It is normal in appearance. Left Ovary measures 3.2 x 2.7 x 1.6 cm. It is normal appearance. There is no evidence of a corpus luteal cyst . Survey of the adnexa demonstrates no adnexal masses. There is no free peritoneal fluid in the cul de sac.  Impression: 1. 19 6/7 week Viable Singleton Intrauterine pregnancy by U/S. 2. (U/S) EDD is consistent with Clinically established (LMP) EDD of 09/03/17. 3. Normal Anatomy Scan

## 2017-05-23 ENCOUNTER — Ambulatory Visit (INDEPENDENT_AMBULATORY_CARE_PROVIDER_SITE_OTHER): Payer: Medicaid Other | Admitting: Certified Nurse Midwife

## 2017-05-23 VITALS — BP 122/67 | HR 74 | Wt 134.5 lb

## 2017-05-23 DIAGNOSIS — R0781 Pleurodynia: Secondary | ICD-10-CM

## 2017-05-23 DIAGNOSIS — Z3492 Encounter for supervision of normal pregnancy, unspecified, second trimester: Secondary | ICD-10-CM

## 2017-05-23 DIAGNOSIS — Z9181 History of falling: Secondary | ICD-10-CM

## 2017-05-23 LAB — POCT URINALYSIS DIPSTICK
BILIRUBIN UA: NEGATIVE
GLUCOSE UA: NEGATIVE
Ketones, UA: NEGATIVE
LEUKOCYTES UA: NEGATIVE
NITRITE UA: NEGATIVE
Spec Grav, UA: 1.015 (ref 1.010–1.025)
UROBILINOGEN UA: 0.2 U/dL
pH, UA: 6 (ref 5.0–8.0)

## 2017-05-23 MED ORDER — LIDOCAINE 5 % EX PTCH: 1.0000 | MEDICATED_PATCH | CUTANEOUS | 0 refills | Status: DC

## 2017-05-23 NOTE — Patient Instructions (Signed)

## 2017-05-23 NOTE — Progress Notes (Signed)
Subjective:   Kathleen Glass is a 21 y.o. G1P0 [redacted]w[redacted]d being seen today for problem visit.  Patient reports falling on wet bathroom floor and hitting left side last night around 2230. Endorses left rib pain with movement.   Denies contractions, vaginal bleeding or leaking of fluid.  Reports good fetal movement.  Denies difficulty breathing or respiratory distress, chest pain, abdominal pain, dysuria, and leg pain or swelling.   The following portions of the patient's history were reviewed and updated as appropriate: allergies, current medications, past family history, past medical history, past social history, past surgical history and problem list.   Review of systems:   ROS negative except as noted above. Information obtained from patient.   Objective:   BP 122/67   Pulse 74   Wt 134 lb 8 oz (61 kg)   LMP 12/27/2016   BMI 24.60 kg/m   FHT: Fetal Heart Rate (bpm): 148  Uterine Size: Fundal Height: 21 cm  Fetal Movement: Movement: Present    Abdomen:  soft, gravid, appropriate for gestational age,non-tender   Results for orders placed or performed in visit on 05/23/17 (from the past 24 hour(s))  POCT urinalysis dipstick     Status: None   Collection Time: 05/23/17 10:41 AM  Result Value Ref Range   Color, UA dark yellow    Clarity, UA clear    Glucose, UA neg    Bilirubin, UA neg    Ketones, UA neg    Spec Grav, UA 1.015 1.010 - 1.025   Blood, UA large    pH, UA 6.0 5.0 - 8.0   Protein, UA trace    Urobilinogen, UA 0.2 0.2 or 1.0 E.U./dL   Nitrite, UA neg    Leukocytes, UA Negative Negative    Assessment and Plan:   Pregnancy:  G1P0 at [redacted]w[redacted]d  1. Second trimester pregnancy  - POCT urinalysis dipstick  2. Status post fall   3. Rib pain on left side   Assessment and Plan:   Rx: Lidoderm patch, see orders.   Preterm labor symptoms: vaginal bleeding, contractions and leaking of fluid reviewed in detail.  Fetal movement precautions reviewed.  Follow up  as  previously scheduled.   Diona Fanti, CNM

## 2017-05-23 NOTE — Progress Notes (Signed)
OB WORK IN- pt fell getting out of bath tub 05/22/17, fell on her L side, painful

## 2017-06-10 ENCOUNTER — Ambulatory Visit (INDEPENDENT_AMBULATORY_CARE_PROVIDER_SITE_OTHER): Payer: Medicaid Other | Admitting: Obstetrics and Gynecology

## 2017-06-10 VITALS — BP 133/80 | HR 108 | Wt 139.6 lb

## 2017-06-10 DIAGNOSIS — Z3492 Encounter for supervision of normal pregnancy, unspecified, second trimester: Secondary | ICD-10-CM

## 2017-06-10 LAB — POCT URINALYSIS DIPSTICK
BILIRUBIN UA: NEGATIVE
Blood, UA: NEGATIVE
GLUCOSE UA: NEGATIVE
KETONES UA: NEGATIVE
LEUKOCYTES UA: NEGATIVE
Nitrite, UA: NEGATIVE
Protein, UA: NEGATIVE
SPEC GRAV UA: 1.01 (ref 1.010–1.025)
Urobilinogen, UA: 0.2 E.U./dL
pH, UA: 7 (ref 5.0–8.0)

## 2017-06-10 NOTE — Progress Notes (Signed)
ROB- pt has been having some dizzy spells and very light headed, otherwise she is doing well

## 2017-06-10 NOTE — Progress Notes (Signed)
ROB-encouraged frequent high protein snacks, discussed enrolling in classes and schedule giving. glucola next visit.

## 2017-07-08 ENCOUNTER — Other Ambulatory Visit: Payer: Self-pay | Admitting: Certified Nurse Midwife

## 2017-07-08 ENCOUNTER — Encounter: Payer: Self-pay | Admitting: Certified Nurse Midwife

## 2017-07-08 ENCOUNTER — Ambulatory Visit (INDEPENDENT_AMBULATORY_CARE_PROVIDER_SITE_OTHER): Payer: Medicaid Other | Admitting: Certified Nurse Midwife

## 2017-07-08 ENCOUNTER — Other Ambulatory Visit: Payer: Medicaid Other

## 2017-07-08 VITALS — BP 123/63 | HR 75 | Wt 144.4 lb

## 2017-07-08 DIAGNOSIS — Z23 Encounter for immunization: Secondary | ICD-10-CM

## 2017-07-08 DIAGNOSIS — R8299 Other abnormal findings in urine: Secondary | ICD-10-CM

## 2017-07-08 DIAGNOSIS — Z130 Encounter for screening for diseases of the blood and blood-forming organs and certain disorders involving the immune mechanism: Secondary | ICD-10-CM

## 2017-07-08 DIAGNOSIS — Z131 Encounter for screening for diabetes mellitus: Secondary | ICD-10-CM

## 2017-07-08 DIAGNOSIS — R82998 Other abnormal findings in urine: Secondary | ICD-10-CM

## 2017-07-08 DIAGNOSIS — Z3403 Encounter for supervision of normal first pregnancy, third trimester: Secondary | ICD-10-CM

## 2017-07-08 LAB — POCT URINALYSIS DIPSTICK
Bilirubin, UA: NEGATIVE
Blood, UA: NEGATIVE
Glucose, UA: NEGATIVE
Ketones, UA: NEGATIVE
Nitrite, UA: POSITIVE
Protein, UA: NEGATIVE
Spec Grav, UA: 1.025 (ref 1.010–1.025)
Urobilinogen, UA: 0.2 E.U./dL
pH, UA: 6.5 (ref 5.0–8.0)

## 2017-07-08 MED ORDER — NITROFURANTOIN MONOHYD MACRO 100 MG PO CAPS: 100.0000 mg | ORAL_CAPSULE | Freq: Two times a day (BID) | ORAL | 0 refills | Status: AC

## 2017-07-08 MED ORDER — TETANUS-DIPHTH-ACELL PERTUSSIS 5-2.5-18.5 LF-MCG/0.5 IM SUSP
0.5000 mL | Freq: Once | INTRAMUSCULAR | Status: AC
  Administered 2017-07-08: 0.5 mL via INTRAMUSCULAR
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
## 2017-07-08 NOTE — Patient Instructions (Signed)

## 2017-07-09 LAB — CBC WITH DIFFERENTIAL/PLATELET
BASOS ABS: 0 10*3/uL (ref 0.0–0.2)
Basos: 0 %
EOS (ABSOLUTE): 0.1 10*3/uL (ref 0.0–0.4)
EOS: 1 %
HEMOGLOBIN: 10.7 g/dL — AB (ref 11.1–15.9)
Hematocrit: 34.6 % (ref 34.0–46.6)
IMMATURE GRANS (ABS): 0.1 10*3/uL (ref 0.0–0.1)
Immature Granulocytes: 1 %
LYMPHS: 23 %
Lymphocytes Absolute: 3.1 10*3/uL (ref 0.7–3.1)
MCH: 27.3 pg (ref 26.6–33.0)
MCHC: 30.9 g/dL — AB (ref 31.5–35.7)
MCV: 88 fL (ref 79–97)
MONOCYTES: 9 %
Monocytes Absolute: 1.2 10*3/uL — ABNORMAL HIGH (ref 0.1–0.9)
NEUTROS ABS: 9.1 10*3/uL — AB (ref 1.4–7.0)
Neutrophils: 66 %
Platelets: 294 10*3/uL (ref 150–379)
RBC: 3.92 x10E6/uL (ref 3.77–5.28)
RDW: 14.4 % (ref 12.3–15.4)
WBC: 13.7 10*3/uL — ABNORMAL HIGH (ref 3.4–10.8)

## 2017-07-09 LAB — GLUCOSE, 1 HOUR GESTATIONAL: GESTATIONAL DIABETES SCREEN: 59 mg/dL — AB (ref 65–139)

## 2017-07-11 ENCOUNTER — Encounter: Payer: Self-pay | Admitting: Certified Nurse Midwife

## 2017-07-12 LAB — URINE CULTURE

## 2017-07-13 ENCOUNTER — Encounter: Payer: Self-pay | Admitting: Certified Nurse Midwife

## 2017-07-13 ENCOUNTER — Other Ambulatory Visit: Payer: Self-pay | Admitting: Certified Nurse Midwife

## 2017-07-13 NOTE — Progress Notes (Signed)
PT currently on Macrobid for uti, Culture came back and it is susceptible to the [place].

## 2017-07-19 ENCOUNTER — Ambulatory Visit (INDEPENDENT_AMBULATORY_CARE_PROVIDER_SITE_OTHER): Payer: Medicaid Other | Admitting: Certified Nurse Midwife

## 2017-07-19 VITALS — BP 118/52 | HR 87 | Wt 145.8 lb

## 2017-07-19 DIAGNOSIS — Z3483 Encounter for supervision of other normal pregnancy, third trimester: Secondary | ICD-10-CM

## 2017-07-19 LAB — POCT URINALYSIS DIPSTICK
Bilirubin, UA: NEGATIVE
Glucose, UA: NEGATIVE
KETONES UA: NEGATIVE
Leukocytes, UA: NEGATIVE
Nitrite, UA: NEGATIVE
Protein, UA: NEGATIVE
RBC UA: NEGATIVE
SPEC GRAV UA: 1.01 (ref 1.010–1.025)
UROBILINOGEN UA: 0.2 U/dL
pH, UA: 7.5 (ref 5.0–8.0)

## 2017-07-19 NOTE — Progress Notes (Signed)
ROB-Pt doing well, report back pain. Discussed home treatment measures including the use of abdominal support. Reviewed intrapartum pain management options, pt unsure at this time. Plans breastfeeding and desires Nexplanon as PP contraception. Reviewed red flag symptoms and when to call. RTC x 2 weeks for ROB or sooner if needed.

## 2017-07-19 NOTE — Patient Instructions (Signed)
Vaginal Delivery Vaginal delivery means that you will give birth by pushing your baby out of your birth canal (vagina). A team of health care providers will help you before, during, and after vaginal delivery. Birth experiences are unique for every woman and every pregnancy, and birth experiences vary depending on where you choose to give birth. What should I do to prepare for my baby's birth? Before your baby is born, it is important to talk with your health care provider about:  Your labor and delivery preferences. These may include: ? Medicines that you may be given. ? How you will manage your pain. This might include non-medical pain relief techniques or injectable pain relief such as epidural analgesia. ? How you and your baby will be monitored during labor and delivery. ? Who may be in the labor and delivery room with you. ? Your feelings about surgical delivery of your baby (cesarean delivery, or C-section) if this becomes necessary. ? Your feelings about receiving donated blood through an IV tube (blood transfusion) if this becomes necessary.  Whether you are able: ? To take pictures or videos of the birth. ? To eat during labor and delivery. ? To move around, walk, or change positions during labor and delivery.  What to expect after your baby is born, such as: ? Whether delayed umbilical cord clamping and cutting is offered. ? Who will care for your baby right after birth. ? Medicines or tests that may be recommended for your baby. ? Whether breastfeeding is supported in your hospital or birth center. ? How long you will be in the hospital or birth center.  How any medical conditions you have may affect your baby or your labor and delivery experience.  To prepare for your baby's birth, you should also:  Attend all of your health care visits before delivery (prenatal visits) as recommended by your health care provider. This is important.  Prepare your home for your baby's  arrival. Make sure that you have: ? Diapers. ? Baby clothing. ? Feeding equipment. ? Safe sleeping arrangements for you and your baby.  Install a car seat in your vehicle. Have your car seat checked by a certified car seat installer to make sure that it is installed safely.  Think about who will help you with your new baby at home for at least the first several weeks after delivery.  What can I expect when I arrive at the birth center or hospital? Once you are in labor and have been admitted into the hospital or birth center, your health care provider may:  Review your pregnancy history and any concerns you have.  Insert an IV tube into one of your veins. This is used to give you fluids and medicines.  Check your blood pressure, pulse, temperature, and heart rate (vital signs).  Check whether your bag of water (amniotic sac) has broken (ruptured).  Talk with you about your birth plan and discuss pain control options.  Monitoring Your health care provider may monitor your contractions (uterine monitoring) and your baby's heart rate (fetal monitoring). You may need to be monitored:  Often, but not continuously (intermittently).  All the time or for long periods at a time (continuously). Continuous monitoring may be needed if: ? You are taking certain medicines, such as medicine to relieve pain or make your contractions stronger. ? You have pregnancy or labor complications.  Monitoring may be done by:  Placing a special stethoscope or a handheld monitoring device on your abdomen to   check your baby's heartbeat, and feeling your abdomen for contractions. This method of monitoring does not continuously record your baby's heartbeat or your contractions.  Placing monitors on your abdomen (external monitors) to record your baby's heartbeat and the frequency and length of contractions. You may not have to wear external monitors all the time.  Placing monitors inside of your uterus  (internal monitors) to record your baby's heartbeat and the frequency, length, and strength of your contractions. ? Your health care provider may use internal monitors if he or she needs more information about the strength of your contractions or your baby's heart rate. ? Internal monitors are put in place by passing a thin, flexible wire through your vagina and into your uterus. Depending on the type of monitor, it may remain in your uterus or on your baby's head until birth. ? Your health care provider will discuss the benefits and risks of internal monitoring with you and will ask for your permission before inserting the monitors.  Telemetry. This is a type of continuous monitoring that can be done with external or internal monitors. Instead of having to stay in bed, you are able to move around during telemetry. Ask your health care provider if telemetry is an option for you.  Physical exam Your health care provider may perform a physical exam. This may include:  Checking whether your baby is positioned: ? With the head toward your vagina (head-down). This is most common. ? With the head toward the top of your uterus (head-up or breech). If your baby is in a breech position, your health care provider may try to turn your baby to a head-down position so you can deliver vaginally. If it does not seem that your baby can be born vaginally, your provider may recommend surgery to deliver your baby. In rare cases, you may be able to deliver vaginally if your baby is head-up (breech delivery). ? Lying sideways (transverse). Babies that are lying sideways cannot be delivered vaginally.  Checking your cervix to determine: ? Whether it is thinning out (effacing). ? Whether it is opening up (dilating). ? How low your baby has moved into your birth canal.  What are the three stages of labor and delivery?  Normal labor and delivery is divided into the following three stages: Stage 1  Stage 1 is the  longest stage of labor, and it can last for hours or days. Stage 1 includes: ? Early labor. This is when contractions may be irregular, or regular and mild. Generally, early labor contractions are more than 10 minutes apart. ? Active labor. This is when contractions get longer, more regular, more frequent, and more intense. ? The transition phase. This is when contractions happen very close together, are very intense, and may last longer than during any other part of labor.  Contractions generally feel mild, infrequent, and irregular at first. They get stronger, more frequent (about every 2-3 minutes), and more regular as you progress from early labor through active labor and transition.  Many women progress through stage 1 naturally, but you may need help to continue making progress. If this happens, your health care provider may talk with you about: ? Rupturing your amniotic sac if it has not ruptured yet. ? Giving you medicine to help make your contractions stronger and more frequent.  Stage 1 ends when your cervix is completely dilated to 4 inches (10 cm) and completely effaced. This happens at the end of the transition phase. Stage 2  Once   your cervix is completely effaced and dilated to 4 inches (10 cm), you may start to feel an urge to push. It is common for the body to naturally take a rest before feeling the urge to push, especially if you received an epidural or certain other pain medicines. This rest period may last for up to 1-2 hours, depending on your unique labor experience.  During stage 2, contractions are generally less painful, because pushing helps relieve contraction pain. Instead of contraction pain, you may feel stretching and burning pain, especially when the widest part of your baby's head passes through the vaginal opening (crowning).  Your health care provider will closely monitor your pushing progress and your baby's progress through the vagina during stage 2.  Your  health care provider may massage the area of skin between your vaginal opening and anus (perineum) or apply warm compresses to your perineum. This helps it stretch as the baby's head starts to crown, which can help prevent perineal tearing. ? In some cases, an incision may be made in your perineum (episiotomy) to allow the baby to pass through the vaginal opening. An episiotomy helps to make the opening of the vagina larger to allow more room for the baby to fit through.  It is very important to breathe and focus so your health care provider can control the delivery of your baby's head. Your health care provider may have you decrease the intensity of your pushing, to help prevent perineal tearing.  After delivery of your baby's head, the shoulders and the rest of the body generally deliver very quickly and without difficulty.  Once your baby is delivered, the umbilical cord may be cut right away, or this may be delayed for 1-2 minutes, depending on your baby's health. This may vary among health care providers, hospitals, and birth centers.  If you and your baby are healthy enough, your baby may be placed on your chest or abdomen to help maintain the baby's temperature and to help you bond with each other. Some mothers and babies start breastfeeding at this time. Your health care team will dry your baby and help keep your baby warm during this time.  Your baby may need immediate care if he or she: ? Showed signs of distress during labor. ? Has a medical condition. ? Was born too early (prematurely). ? Had a bowel movement before birth (meconium). ? Shows signs of difficulty transitioning from being inside the uterus to being outside of the uterus. If you are planning to breastfeed, your health care team will help you begin a feeding. Stage 3  The third stage of labor starts immediately after the birth of your baby and ends after you deliver the placenta. The placenta is an organ that develops  during pregnancy to provide oxygen and nutrients to your baby in the womb.  Delivering the placenta may require some pushing, and you may have mild contractions. Breastfeeding can stimulate contractions to help you deliver the placenta.  After the placenta is delivered, your uterus should tighten (contract) and become firm. This helps to stop bleeding in your uterus. To help your uterus contract and to control bleeding, your health care provider may: ? Give you medicine by injection, through an IV tube, by mouth, or through your rectum (rectally). ? Massage your abdomen or perform a vaginal exam to remove any blood clots that are left in your uterus. ? Empty your bladder by placing a thin, flexible tube (catheter) into your bladder. ? Encourage   you to breastfeed your baby. After labor is over, you and your baby will be monitored closely to ensure that you are both healthy until you are ready to go home. Your health care team will teach you how to care for yourself and your baby. This information is not intended to replace advice given to you by your health care provider. Make sure you discuss any questions you have with your health care provider. Document Released: 07/27/2008 Document Revised: 05/07/2016 Document Reviewed: 11/02/2015 Elsevier Interactive Patient Education  2018 [employer]. Cord Blood Banking Information Cord blood banking is the process of collecting and storing the blood that is in the umbilical cord and placenta at the time of delivery. This blood contains stem cells, which can be used to treat many blood diseases, immune system disorders, and childhood cancers. Stem cells can also be used to research certain diseases and treatments. Many people who choose cord blood banking donate the blood. Donated blood can be used in lifesaving treatments or for research. Other people choose to store the blood privately. Blood that is stored privately can only be used with the person's  permission. This option is often chosen if:  A family member needs a stem cell transplant.  The child is part of an ethnic minority.  The child was conceived through in vitro fertilization.  What should I look for in a blood bank? A blood bank is the organization that coordinates cord blood banking. Make sure the cord blood bank that you use:  Is accredited.  Is financially stable.  Handles a large volume of cord blood samples.  Has a procedure in place for transport and storage.  Allows you the option of transferring your cord blood sample.  Has a procedure in place if the bank goes out of business.  Clearly states all costs and limits to future costs.  People who choose to donate cord blood should not need to pay for blood banking. People who keep the blood for private use will need to pay for the first (initial) storage and pay a fee each year (annual fee). Other fees may also apply. What are the risks of cord blood banking? There are no health risks associated with cord blood banking. It is considered safe. How should I prepare? You must schedule this process at least 4-6 weeks before you will be giving birth. How is the blood collected? The blood is collected as soon as the baby has been delivered. Within 15 minutes of delivery, a health care provider will take these actions to collect the blood:  Clamp the umbilical cord at the top and bottom. This traps the blood in the umbilical cord.  Use a syringe or bag to collect the blood.  Insert needles into the placenta to collect (draw out) more blood.  What happens after the blood is collected? After the blood has been collected:  The blood will be sent to a blood bank.  The blood will be tested for genetic problems and infectious diseases. If the blood tests positive for a genetic problem or a disease, someone will contact you and let you know.  The blood will be frozen.  If your child develops a genetic condition,  immune system disorder, or cancer, you will be responsible for contacting the blood bank and letting them know. This information is not intended to replace advice given to you by your health care provider. Make sure you discuss any questions you have with your health care provider. Document Released:  04/07/2010 Document Revised: 03/25/2016 Document Reviewed: 04/07/2015 Elsevier Interactive Patient Education  2018 [place] Class  April 13, 2017  Wednesday 7:00p - 9:00p  [place], [place]  May 18, 2017  Wednesday [place], [place]    June 22, 2017   Wednesday [place], [place]  July 20, 2017  Wednesday  [place], [place]  August 17, 2017 Wednesday [place], [place]  Interested in a waterbirth?  This informational class will help you discover whether waterbirth is the right fit for you.  Education about waterbirth itself, supplies you would need and how to assemble your support team is what you can expect from this class.  Some obstetrical practices require this class in order to pursue a waterbirth.  (Not all obstetrical practices offer waterbirth check with your healthcare provider)  Register only the expectant mom, but you are encouraged to bring your partner to class!  Fees & Payment No fee  Register Online [email or web address] Search [place] 2018 Prenatal Education Class Schedule Register at [email or web address] in the [place] or call Kerman Passey at [phone] 9:00a-5:00p M-F  Childbirth Preparation Certified Childbirth Educators teach this 5 week course.  Expectant parents are encouraged to take this class in their 3rd trimester, completing it by their 35-36th week. Meets in [facility], Lower Level.  Mondays Thursdays  7:00-9:00 p 7:00-9:00 p  July 23 - August 20 July 19 - August 16  September 17 - October 15 September 6 -October 4  November 5 - December 3 October 25 - November 29   No Class on Thanksgiving Day -November 22  Childbirth Preparation Refresher Course For those who have previously attended Prepared Childbirth Preparation classes, this class in incorporated into the 3rd and 4th classes in the Monday night childbirth series.  Course meets in the [facility]. Lower Level from 7:00p - 9:00p  August 6 & 13  October 1 & 8  November 19 & 26   Weekend Childbirth Waymon Amato Classes are held Saturday & Sunday, 1:00 5:00p Course meets in [facility] [place] Level  August 4 & 5  November 3 & 4    The BirthPlace Tours Free tours are held on the third Sunday of each month at 3 pm.  The tour meets in the third floor waiting area and will take approximately 30 minutes.  Tours are also included in Childbirth class series as well as Brother/Sister class.  An online virtual tour can be seen at [email or web address].         Breastfeeding & Infant Nutrition The course incorporates returning to work or school.  Breast milk collection and storage with basic breastfeeding and infant nutrition. This two-class course is held the 2nd and 3rd Tuesday of each month from 7:00 -9:00 pm.  Course meets in the [facility] Lower level  June 12 & 19 July-No Class  August 14 & 21 September 11 &18  September 11 & 18 October 9 &16  November 13 & 20 December 11 & 18   [employer] welcomes any mother for a social outing with other Moms to share experiences and challenges in an informal setting.  Meets the 1st Thursday and 3rd Thursday 11:30a-1:00 pm of each month in [facility] 3rd floor classroom.  No registration required.  Newborn Essentials  This course covers bathing, diapering, swaddling and more with practice on lifelike  dolls.  Participants will also learn safety tips and infant CPR (Not for certification).  It is held the 1st Wednesday of each month from 7:00p-9:00p in the [facility], Lower level.  June 6 July- No Class  August 1 September 5  October 3 November 7  December 7    Preparing Big Brother & Sister This one session course prepares children and their parents for the arrival of a new baby.  It is held on the 1st Tuesday of each month from 6:30p - 8:00p. Course meets in the [facility], Lower level.  July-No Class August 7  September 4 October 2  November 6 December 4   [employer] [place] for [employer] Dads This nationally acclaimed class helps expecting and new dads with the basic skills and confidence to bond with their infants, support their mates, and provide a safe and healthy home environment for their new family. Classes are held the 2nd Saturday of every month from 9:00a - 12:00 noon.  Course meets in the [facility] Lower level.  June 9 August 11  October 13 No Class in December  Third Trimester of Pregnancy The third trimester is from week 28 through week 40 (months 7 through 9). The third trimester is a time when the unborn baby (fetus) is growing rapidly. At the end of the ninth month, the fetus is about 20 inches in length and weighs 6-10 pounds. Body changes during your third trimester Your body will continue to go through many changes during pregnancy. The changes vary from woman to woman. During the third trimester:  Your weight will continue to increase. You can expect to gain 25-35 pounds (11-16 kg) by the end of the pregnancy.  You may begin to get stretch marks on your hips, abdomen, and breasts.  You may urinate more often because the fetus is moving lower into your pelvis and pressing on your bladder.  You may develop or continue to have heartburn. This is caused by increased hormones that slow down muscles in the digestive tract.  You may develop or  continue to have constipation because increased hormones slow digestion and cause the muscles that push waste through your intestines to relax.  You may develop hemorrhoids. These are swollen veins (varicose veins) in the rectum that can itch or be painful.  You may develop swollen, bulging veins (varicose veins) in your legs.  You may have increased body aches in the pelvis, back, or thighs. This is due to weight gain and increased hormones that are relaxing your joints.  You may have changes in your hair. These can include thickening of your hair, rapid growth, and changes in texture. Some women also have hair loss during or after pregnancy, or hair that feels dry or thin. Your hair will most likely return to normal after your baby is born.  Your breasts will continue to grow and they will continue to become tender. A yellow fluid (colostrum) may leak from your breasts. This is the first milk you are producing for your baby.  Your belly button may stick out.  You may notice more swelling in your hands, face, or ankles.  You may have increased tingling or numbness in your hands, arms, and legs. The skin on your belly may also feel numb.  You may feel short of breath because of your expanding uterus.  You may have more problems sleeping. This can be  caused by the size of your belly, increased need to urinate, and an increase in your body's metabolism.  You may notice the fetus "dropping," or moving lower in your abdomen (lightening).  You may have increased vaginal discharge.  You may notice your joints feel loose and you may have pain around your pelvic bone.  What to expect at prenatal visits You will have prenatal exams every 2 weeks until week 36. Then you will have weekly prenatal exams. During a routine prenatal visit:  You will be weighed to make sure you and the baby are growing normally.  Your blood pressure will be taken.  Your abdomen will be measured to track your  baby's growth.  The fetal heartbeat will be listened to.  Any test results from the previous visit will be discussed.  You may have a cervical check near your due date to see if your cervix has softened or thinned (effaced).  You will be tested for Group B streptococcus. This happens between 35 and 37 weeks.  Your health care provider may ask you:  What your birth plan is.  How you are feeling.  If you are feeling the baby move.  If you have had any abnormal symptoms, such as leaking fluid, bleeding, severe headaches, or abdominal cramping.  If you are using any tobacco products, including cigarettes, chewing tobacco, and electronic cigarettes.  If you have any questions.  Other tests or screenings that may be performed during your third trimester include:  Blood tests that check for low iron levels (anemia).  Fetal testing to check the health, activity level, and growth of the fetus. Testing is done if you have certain medical conditions or if there are problems during the pregnancy.  Nonstress test (NST). This test checks the health of your baby to make sure there are no signs of problems, such as the baby not getting enough oxygen. During this test, a belt is placed around your belly. The baby is made to move, and its heart rate is monitored during movement.  What is false labor? False labor is a condition in which you feel small, irregular tightenings of the muscles in the womb (contractions) that usually go away with rest, changing position, or drinking water. These are called Braxton Hicks contractions. Contractions may last for hours, days, or even weeks before true labor sets in. If contractions come at regular intervals, become more frequent, increase in intensity, or become painful, you should see your health care provider. What are the signs of labor?  Abdominal cramps.  Regular contractions that start at 10 minutes apart and become stronger and more frequent with  time.  Contractions that start on the top of the uterus and spread down to the lower abdomen and back.  Increased pelvic pressure and dull back pain.  A watery or bloody mucus discharge that comes from the vagina.  Leaking of amniotic fluid. This is also known as your "water breaking." It could be a slow trickle or a gush. Let your health care provider know if it has a color or strange odor. If you have any of these signs, call your health care provider right away, even if it is before your due date. Follow these instructions at home: Medicines  Follow your health care provider's instructions regarding medicine use. Specific medicines may be either safe or unsafe to take during pregnancy.  Take a prenatal vitamin that contains at least 600 micrograms (mcg) of folic acid.  If you develop constipation, try  taking a stool softener if your health care provider approves. Eating and drinking  Eat a balanced diet that includes fresh fruits and vegetables, whole grains, good sources of protein such as meat, eggs, or tofu, and low-fat dairy. Your health care provider will help you determine the amount of weight gain that is right for you.  Avoid raw meat and uncooked cheese. These carry germs that can cause birth defects in the baby.  If you have low calcium intake from food, talk to your health care provider about whether you should take a daily calcium supplement.  Eat four or five small meals rather than three large meals a day.  Limit foods that are high in fat and processed sugars, such as fried and sweet foods.  To prevent constipation: ? Drink enough fluid to keep your urine clear or pale yellow. ? Eat foods that are high in fiber, such as fresh fruits and vegetables, whole grains, and beans. Activity  Exercise only as directed by your health care provider. Most women can continue their usual exercise routine during pregnancy. Try to exercise for 30 minutes at least 5 days a week.  Stop exercising if you experience uterine contractions.  Avoid heavy lifting.  Do not exercise in extreme heat or humidity, or at high altitudes.  Wear low-heel, comfortable shoes.  Practice good posture.  You may continue to have sex unless your health care provider tells you otherwise. Relieving pain and discomfort  Take frequent breaks and rest with your legs elevated if you have leg cramps or low back pain.  Take warm sitz baths to soothe any pain or discomfort caused by hemorrhoids. Use hemorrhoid cream if your health care provider approves.  Wear a good support bra to prevent discomfort from breast tenderness.  If you develop varicose veins: ? Wear support pantyhose or compression stockings as told by your healthcare provider. ? Elevate your feet for 15 minutes, 3-4 times a day. Prenatal care  Write down your questions. Take them to your prenatal visits.  Keep all your prenatal visits as told by your health care provider. This is important. Safety  Wear your seat belt at all times when driving.  Make a list of emergency phone numbers, including numbers for family, friends, the hospital, and police and fire departments. General instructions  Avoid cat litter boxes and soil used by cats. These carry germs that can cause birth defects in the baby. If you have a cat, ask someone to clean the litter box for you.  Do not travel far distances unless it is absolutely necessary and only with the approval of your health care provider.  Do not use hot tubs, steam rooms, or saunas.  Do not drink alcohol.  Do not use any products that contain nicotine or tobacco, such as cigarettes and e-cigarettes. If you need help quitting, ask your health care provider.  Do not use any medicinal herbs or unprescribed drugs. These chemicals affect the formation and growth of the baby.  Do not douche or use tampons or scented sanitary pads.  Do not cross your legs for long periods of  time.  To prepare for the arrival of your baby: ? Take prenatal classes to understand, practice, and ask questions about labor and delivery. ? Make a trial run to the hospital. ? Visit the hospital and tour the maternity area. ? Arrange for maternity or paternity leave through employers. ? Arrange for family and friends to take care of pets while you are in  the hospital. ? Purchase a rear-facing car seat and make sure you know how to install it in your car. ? Pack your hospital bag. ? Prepare the baby's nursery. Make sure to remove all pillows and stuffed animals from the baby's crib to prevent suffocation.  Visit your dentist if you have not gone during your pregnancy. Use a soft toothbrush to brush your teeth and be gentle when you floss. Contact a health care provider if:  You are unsure if you are in labor or if your water has broken.  You become dizzy.  You have mild pelvic cramps, pelvic pressure, or nagging pain in your abdominal area.  You have lower back pain.  You have persistent nausea, vomiting, or diarrhea.  You have an unusual or bad smelling vaginal discharge.  You have pain when you urinate. Get help right away if:  Your water breaks before 37 weeks.  You have regular contractions less than 5 minutes apart before 37 weeks.  You have a fever.  You are leaking fluid from your vagina.  You have spotting or bleeding from your vagina.  You have severe abdominal pain or cramping.  You have rapid weight loss or weight gain.  You have shortness of breath with chest pain.  You notice sudden or extreme swelling of your face, hands, ankles, feet, or legs.  Your baby makes fewer than 10 movements in 2 hours.  You have severe headaches that do not go away when you take medicine.  You have vision changes. Summary  The third trimester is from week 28 through week 40, months 7 through 9. The third trimester is a time when the unborn baby (fetus) is growing  rapidly.  During the third trimester, your discomfort may increase as you and your baby continue to gain weight. You may have abdominal, leg, and back pain, sleeping problems, and an increased need to urinate.  During the third trimester your breasts will keep growing and they will continue to become tender. A yellow fluid (colostrum) may leak from your breasts. This is the first milk you are producing for your baby.  False labor is a condition in which you feel small, irregular tightenings of the muscles in the womb (contractions) that eventually go away. These are called Braxton Hicks contractions. Contractions may last for hours, days, or even weeks before true labor sets in.  Signs of labor can include: abdominal cramps; regular contractions that start at 10 minutes apart and become stronger and more frequent with time; watery or bloody mucus discharge that comes from the vagina; increased pelvic pressure and dull back pain; and leaking of amniotic fluid. This information is not intended to replace advice given to you by your health care provider. Make sure you discuss any questions you have with your health care provider. Document Released: 10/12/2001 Document Revised: 03/25/2016 Document Reviewed: 12/19/2012 Elsevier Interactive Patient Education  2017 [employer].

## 2017-07-31 ENCOUNTER — Encounter: Payer: Self-pay | Admitting: Certified Nurse Midwife

## 2017-08-02 ENCOUNTER — Encounter: Payer: Self-pay | Admitting: Certified Nurse Midwife

## 2017-08-02 ENCOUNTER — Ambulatory Visit (INDEPENDENT_AMBULATORY_CARE_PROVIDER_SITE_OTHER): Payer: Medicaid Other | Admitting: Certified Nurse Midwife

## 2017-08-02 VITALS — BP 113/64 | HR 101 | Wt 148.0 lb

## 2017-08-02 DIAGNOSIS — Z23 Encounter for immunization: Secondary | ICD-10-CM

## 2017-08-02 DIAGNOSIS — Z3483 Encounter for supervision of other normal pregnancy, third trimester: Secondary | ICD-10-CM

## 2017-08-02 LAB — POCT URINALYSIS DIPSTICK
Bilirubin, UA: NEGATIVE
Blood, UA: NEGATIVE
Glucose, UA: NEGATIVE
KETONES UA: NEGATIVE
LEUKOCYTES UA: NEGATIVE
Nitrite, UA: NEGATIVE
PH UA: 7.5 (ref 5.0–8.0)
PROTEIN UA: NEGATIVE
Spec Grav, UA: 1.01 (ref 1.010–1.025)
Urobilinogen, UA: 0.2 E.U./dL

## 2017-08-02 NOTE — Patient Instructions (Addendum)
How a Baby Grows During Pregnancy Pregnancy begins when a female's sperm enters a female's egg (fertilization). This happens in one of the tubes (fallopian tubes) that connect the ovaries to the womb (uterus). The fertilized egg is called an embryo until it reaches 10 weeks. From 10 weeks until birth, it is called a fetus. The fertilized egg moves down the fallopian tube to the uterus. Then it implants into the lining of the uterus and begins to grow. The developing fetus receives oxygen and nutrients through the pregnant woman's bloodstream and the tissues that grow (placenta) to support the fetus. The placenta is the life support system for the fetus. It provides nutrition and removes waste. Learning as much as you can about your pregnancy and how your baby is developing can help you enjoy the experience. It can also make you aware of when there might be a problem and when to ask questions. How long does a typical pregnancy last? A pregnancy usually lasts 280 days, or about 40 weeks. Pregnancy is divided into three trimesters:  First trimester: 0-13 weeks.  Second trimester: 14-27 weeks.  Third trimester: 28-40 weeks.  The day when your baby is considered ready to be born (full term) is your estimated date of delivery. How does my baby develop month by month? First month  The fertilized egg attaches to the inside of the uterus.  Some cells will form the placenta. Others will form the fetus.  The arms, legs, brain, spinal cord, lungs, and heart begin to develop.  At the end of the first month, the heart begins to beat.  Second month  The bones, inner ear, eyelids, hands, and feet form.  The genitals develop.  By the end of 8 weeks, all major organs are developing.  Third month  All of the internal organs are forming.  Teeth develop below the gums.  Bones and muscles begin to grow. The spine can flex.  The skin is transparent.  Fingernails and toenails begin to form.  Arms  and legs continue to grow longer, and hands and feet develop.  The fetus is about 3 in (7.6 cm) long.  Fourth month  The placenta is completely formed.  The external sex organs, neck, outer ear, eyebrows, eyelids, and fingernails are formed.  The fetus can hear, swallow, and move its arms and legs.  The kidneys begin to produce urine.  The skin is covered with a white waxy coating (vernix) and very fine hair (lanugo).  Fifth month  The fetus moves around more and can be felt for the first time (quickening).  The fetus starts to sleep and wake up and may begin to suck its finger.  The nails grow to the end of the fingers.  The organ in the digestive system that makes bile (gallbladder) functions and helps to digest the nutrients.  If your baby is a girl, eggs are present in her ovaries. If your baby is a boy, testicles start to move down into his scrotum.  Sixth month  The lungs are formed, but the fetus is not yet able to breathe.  The eyes open. The brain continues to develop.  Your baby has fingerprints and toe prints. Your baby's hair grows thicker.  At the end of the second trimester, the fetus is about 9 in (22.9 cm) long.  Seventh month  The fetus kicks and stretches.  The eyes are developed enough to sense changes in light.  The hands can make a grasping motion.  The  fetus responds to sound.  Eighth month  All organs and body systems are fully developed and functioning.  Bones harden and taste buds develop. The fetus may hiccup.  Certain areas of the brain are still developing. The skull remains soft.  Ninth month  The fetus gains about  lb (0.23 kg) each week.  The lungs are fully developed.  Patterns of sleep develop.  The fetus's head typically moves into a head-down position (vertex) in the uterus to prepare for birth. If the buttocks move into a vertex position instead, the baby is breech.  The fetus weighs 6-9 lbs (2.72-4.08 kg) and is  19-20 in (48.26-50.8 cm) long.  What can I do to have a healthy pregnancy and help my baby develop? Eating and Drinking  Eat a healthy diet. ? Talk with your health care provider to make sure that you are getting the nutrients that you and your baby need. ? Visit www.choosemyplate.gov to learn about creating a healthy diet.  Gain a healthy amount of weight during pregnancy as advised by your health care provider. This is usually 25-35 pounds. You may need to: ? Gain more if you were underweight before getting pregnant or if you are pregnant with more than one baby. ? Gain less if you were overweight or obese when you got pregnant.  Medicines and Vitamins  Take prenatal vitamins as directed by your health care provider. These include vitamins such as folic acid, iron, calcium, and vitamin D. They are important for healthy development.  Take medicines only as directed by your health care provider. Read labels and ask a pharmacist or your health care provider whether over-the-counter medicines, supplements, and prescription drugs are safe to take during pregnancy.  Activities  Be physically active as advised by your health care provider. Ask your health care provider to recommend activities that are safe for you to do, such as walking or swimming.  Do not participate in strenuous or extreme sports.  Lifestyle  Do not drink alcohol.  Do not use any tobacco products, including cigarettes, chewing tobacco, or electronic cigarettes. If you need help quitting, ask your health care provider.  Do not use illegal drugs.  Safety  Avoid exposure to mercury, lead, or other heavy metals. Ask your health care provider about common sources of these heavy metals.  Avoid listeria infection during pregnancy. Follow these precautions: ? Do not eat soft cheeses or deli meats. ? Do not eat hot dogs unless they have been warmed up to the point of steaming, such as in the microwave oven. ? Do not  drink unpasteurized milk.  Avoid toxoplasmosis infection during pregnancy. Follow these precautions: ? Do not change your cat's litter box, if you have a cat. Ask someone else to do this for you. ? Wear gardening gloves while working in the yard.  General Instructions  Keep all follow-up visits as directed by your health care provider. This is important. This includes prenatal care and screening tests.  Manage any chronic health conditions. Work closely with your health care provider to keep conditions, such as diabetes, under control.  How do I know if my baby is developing well? At each prenatal visit, your health care provider will do several different tests to check on your health and keep track of your baby's development. These include:  Fundal height. ? Your health care provider will measure your growing belly from top to bottom using a tape measure. ? Your health care provider will also feel your belly   to determine your baby's position.  Heartbeat. ? An ultrasound in the first trimester can confirm pregnancy and show a heartbeat, depending on how far along you are. ? Your health care provider will check your baby's heart rate at every prenatal visit. ? As you get closer to your delivery date, you may have regular fetal heart rate monitoring to make sure that your baby is not in distress.  Second trimester ultrasound. ? This ultrasound checks your baby's development. It also indicates your baby's gender.  What should I do if I have concerns about my baby's development? Always talk with your health care provider about any concerns that you may have. This information is not intended to replace advice given to you by your health care provider. Make sure you discuss any questions you have with your health care provider. Document Released: 04/05/2008 Document Revised: 03/25/2016 Document Reviewed: 03/27/2014 Elsevier Interactive Patient Education  2018 [employer]. Influenza (Flu)  Vaccine (Inactivated or Recombinant): What You Need to Know 1. Why get vaccinated? Influenza ("flu") is a contagious disease that spreads around the [place] every year, usually between October and May. Flu is caused by influenza viruses, and is spread mainly by coughing, sneezing, and close contact. Anyone can get flu. Flu strikes suddenly and can last several days. Symptoms vary by age, but can include:  fever/chills  sore throat  muscle aches  fatigue  cough  headache  runny or stuffy nose  Flu can also lead to pneumonia and blood infections, and cause diarrhea and seizures in children. If you have a medical condition, such as heart or lung disease, flu can make it worse. Flu is more dangerous for some people. Infants and young children, people 56 years of age and older, pregnant women, and people with certain health conditions or a weakened immune system are at greatest risk. Each year thousands of people in the [place] die from flu, and many more are hospitalized. Flu vaccine can:  keep you from getting flu,  make flu less severe if you do get it, and  keep you from spreading flu to your family and other people. 2. Inactivated and recombinant flu vaccines A dose of flu vaccine is recommended every flu season. Children 6 months through 90 years of age may need two doses during the same flu season. Everyone else needs only one dose each flu season. Some inactivated flu vaccines contain a very small amount of a mercury-based preservative called thimerosal. Studies have not shown thimerosal in vaccines to be harmful, but flu vaccines that do not contain thimerosal are available. There is no live flu virus in flu shots. They cannot cause the flu. There are many flu viruses, and they are always changing. Each year a new flu vaccine is made to protect against three or four viruses that are likely to cause disease in the upcoming flu season. But even when the vaccine doesn't  exactly match these viruses, it may still provide some protection. Flu vaccine cannot prevent:  flu that is caused by a virus not covered by the vaccine, or  illnesses that look like flu but are not.  It takes about 2 weeks for protection to develop after vaccination, and protection lasts through the flu season. 3. Some people should not get this vaccine Tell the person who is giving you the vaccine:  If you have any severe, life-threatening allergies. If you ever had a life-threatening allergic reaction after a dose of flu vaccine, or have a severe  allergy to any part of this vaccine, you may be advised not to get vaccinated. Most, but not all, types of flu vaccine contain a small amount of egg protein.  If you ever had Guillain-Barr Syndrome (also called GBS). Some people with a history of GBS should not get this vaccine. This should be discussed with your doctor.  If you are not feeling well. It is usually okay to get flu vaccine when you have a mild illness, but you might be asked to come back when you feel better.  4. Risks of a vaccine reaction With any medicine, including vaccines, there is a chance of reactions. These are usually mild and go away on their own, but serious reactions are also possible. Most people who get a flu shot do not have any problems with it. Minor problems following a flu shot include:  soreness, redness, or swelling where the shot was given  hoarseness  sore, red or itchy eyes  cough  fever  aches  headache  itching  fatigue  If these problems occur, they usually begin soon after the shot and last 1 or 2 days. More serious problems following a flu shot can include the following:  There may be a small increased risk of Guillain-Barre Syndrome (GBS) after inactivated flu vaccine. This risk has been estimated at 1 or 2 additional cases per million people vaccinated. This is much lower than the risk of severe complications from flu, which can be  prevented by flu vaccine.  Young children who get the flu shot along with pneumococcal vaccine (PCV13) and/or DTaP vaccine at the same time might be slightly more likely to have a seizure caused by fever. Ask your doctor for more information. Tell your doctor if a child who is getting flu vaccine has ever had a seizure.  Problems that could happen after any injected vaccine:  People sometimes faint after a medical procedure, including vaccination. Sitting or lying down for about 15 minutes can help prevent fainting, and injuries caused by a fall. Tell your doctor if you feel dizzy, or have vision changes or ringing in the ears.  Some people get severe pain in the shoulder and have difficulty moving the arm where a shot was given. This happens very rarely.  Any medication can cause a severe allergic reaction. Such reactions from a vaccine are very rare, estimated at about 1 in a million doses, and would happen within a few minutes to a few hours after the vaccination. As with any medicine, there is a very remote chance of a vaccine causing a serious injury or death. The safety of vaccines is always being monitored. For more information, visit: http://www.aguilar.org/ 5. What if there is a serious reaction? What should I look for? Look for anything that concerns you, such as signs of a severe allergic reaction, very high fever, or unusual behavior. Signs of a severe allergic reaction can include hives, swelling of the face and throat, difficulty breathing, a fast heartbeat, dizziness, and weakness. These would start a few minutes to a few hours after the vaccination. What should I do?  If you think it is a severe allergic reaction or other emergency that can't wait, call 9-1-1 and get the person to the nearest hospital. Otherwise, call your doctor.  Reactions should be reported to the Vaccine Adverse Event Reporting System (VAERS). Your doctor should file this report, or you can do it yourself  through the VAERS web site at www.vaers.SamedayNews.es, or by calling (726)509-8222. ?  VAERS does not give medical advice. 6. The National Vaccine Injury Compensation Program The [employer] Vaccine Injury Compensation Program (VICP) is a federal program that was created to compensate people who may have been injured by certain vaccines. Persons who believe they may have been injured by a vaccine can learn about the program and about filing a claim by calling (386)878-4757 or visiting the [place] website at GoldCloset.com.ee. There is a time limit to file a claim for compensation. 7. How can I learn more?  Ask your healthcare provider. He or she can give you the vaccine package insert or suggest other sources of information.  Call your local or state health department.  Contact the Centers for Disease Control and Prevention (CDC): ? Call 940-069-6448 (1-800-CDC-INFO) or ? Visit CDC's website at https://gibson.com/ Vaccine Information Statement, Inactivated Influenza Vaccine (06/07/2014) This information is not intended to replace advice given to you by your health care provider. Make sure you discuss any questions you have with your health care provider. Document Released: 08/12/2006 Document Revised: 07/08/2016 Document Reviewed: 07/08/2016 Elsevier Interactive Patient Education  2017 [employer].

## 2017-08-02 NOTE — Progress Notes (Signed)
ROB doing well. Flu shot today. Endorses good fetal movement. Follow up in 2 wks.   Philip Aspen, CNM

## 2017-08-14 ENCOUNTER — Encounter: Payer: Self-pay | Admitting: Certified Nurse Midwife

## 2017-08-15 ENCOUNTER — Encounter: Payer: Self-pay | Admitting: Certified Nurse Midwife

## 2017-08-16 ENCOUNTER — Ambulatory Visit (INDEPENDENT_AMBULATORY_CARE_PROVIDER_SITE_OTHER): Payer: Medicaid Other | Admitting: Certified Nurse Midwife

## 2017-08-16 VITALS — BP 118/64 | HR 94 | Wt 147.0 lb

## 2017-08-16 DIAGNOSIS — K219 Gastro-esophageal reflux disease without esophagitis: Secondary | ICD-10-CM

## 2017-08-16 DIAGNOSIS — Z3493 Encounter for supervision of normal pregnancy, unspecified, third trimester: Secondary | ICD-10-CM

## 2017-08-16 DIAGNOSIS — O99619 Diseases of the digestive system complicating pregnancy, unspecified trimester: Secondary | ICD-10-CM

## 2017-08-16 LAB — POCT URINALYSIS DIPSTICK
BILIRUBIN UA: NEGATIVE
Blood, UA: NEGATIVE
Glucose, UA: NEGATIVE
KETONES UA: NEGATIVE
LEUKOCYTES UA: NEGATIVE
NITRITE UA: NEGATIVE
PROTEIN UA: NEGATIVE
SPEC GRAV UA: 1.01 (ref 1.010–1.025)
Urobilinogen, UA: 0.2 E.U./dL
pH, UA: 7 (ref 5.0–8.0)

## 2017-08-16 MED ORDER — RANITIDINE HCL 150 MG PO CAPS: 150.0000 mg | ORAL_CAPSULE | Freq: Two times a day (BID) | ORAL | 1 refills | Status: DC

## 2017-08-16 NOTE — Patient Instructions (Signed)

## 2017-08-16 NOTE — Progress Notes (Signed)
ROB-Pt reports increased reflux especially at night. Rx: Zantac, see orders. Discussed home treatment measures. Anticipatory guidance regarding course of prenatal care and 36 week cultures. Reviewed red flag symptoms and when to call. RTC x 2 weeks for ROB or sooner if needed.

## 2017-08-16 NOTE — Progress Notes (Signed)
ROB- pt is having acid reflux really bad @ night, would like something sent in for that

## 2017-08-21 ENCOUNTER — Encounter: Payer: Self-pay | Admitting: Certified Nurse Midwife

## 2017-08-22 ENCOUNTER — Encounter: Payer: Self-pay | Admitting: Certified Nurse Midwife

## 2017-08-23 ENCOUNTER — Ambulatory Visit (INDEPENDENT_AMBULATORY_CARE_PROVIDER_SITE_OTHER): Payer: Medicaid Other | Admitting: Obstetrics and Gynecology

## 2017-08-23 ENCOUNTER — Encounter: Payer: Self-pay | Admitting: Obstetrics and Gynecology

## 2017-08-23 VITALS — BP 122/72 | HR 80 | Wt 152.5 lb

## 2017-08-23 DIAGNOSIS — Z3493 Encounter for supervision of normal pregnancy, unspecified, third trimester: Secondary | ICD-10-CM

## 2017-08-23 LAB — POCT URINALYSIS DIPSTICK
Bilirubin, UA: NEGATIVE
Blood, UA: NEGATIVE
GLUCOSE UA: NEGATIVE
Ketones, UA: NEGATIVE
LEUKOCYTES UA: NEGATIVE
Nitrite, UA: NEGATIVE
Protein, UA: NEGATIVE
Spec Grav, UA: 1.015 (ref 1.010–1.025)
Urobilinogen, UA: 0.2 E.U./dL
pH, UA: 6 (ref 5.0–8.0)

## 2017-08-23 NOTE — Progress Notes (Signed)
Work in [employer]- reports shooting lower pelvic pains for last 2 days with normal FM, no sex in 2-3 weeks. No vaginal bleeding. Lots of lower pelvic pressure, worse at work when on her feet a while. Microscopic wet-mount exam shows negative for pathogens, normal epithelial cells. Reassured of normal findings and discussed [facility]

## 2017-08-30 ENCOUNTER — Ambulatory Visit (INDEPENDENT_AMBULATORY_CARE_PROVIDER_SITE_OTHER): Payer: Medicaid Other | Admitting: Certified Nurse Midwife

## 2017-08-30 VITALS — BP 112/76 | HR 94 | Wt 154.8 lb

## 2017-08-30 DIAGNOSIS — Z3493 Encounter for supervision of normal pregnancy, unspecified, third trimester: Secondary | ICD-10-CM

## 2017-08-30 LAB — POCT URINALYSIS DIPSTICK
Bilirubin, UA: NEGATIVE
GLUCOSE UA: NEGATIVE
Ketones, UA: NEGATIVE
Nitrite, UA: NEGATIVE
PH UA: 6.5 (ref 5.0–8.0)
Protein, UA: NEGATIVE
RBC UA: NEGATIVE
Spec Grav, UA: 1.015 (ref 1.010–1.025)
UROBILINOGEN UA: 0.2 U/dL

## 2017-08-30 NOTE — Progress Notes (Signed)
ROB doing well. She continues to have pressure but it has not changed since last visit. Discussed GBS and next visit she verbalizes understanding and agrees to plan. Follow up in 1 wks . With Melody.'  Philip Aspen, CNM

## 2017-08-30 NOTE — Patient Instructions (Signed)
Group B Streptococcus Infection During Pregnancy Group B Streptococcus (GBS) is a type of bacteria (Streptococcus agalactiae) that is often found in healthy people, commonly in the rectum, vagina, and intestines. In people who are healthy and not pregnant, the bacteria rarely cause serious illness or complications. However, women who test positive for GBS during pregnancy can pass the bacteria to their baby during childbirth, which can cause serious infection in the baby after birth. Women with GBS may also have infections during their pregnancy or immediately after childbirth, such as such as urinary tract infections (UTIs) or infections of the uterus (uterine infections). Having GBS also increases a woman's risk of complications during pregnancy, such as early (preterm) labor or delivery, miscarriage, or stillbirth. Routine testing (screening) for GBS is recommended for all pregnant women. What increases the risk? You may have a higher risk for GBS infection during pregnancy if you had one during a past pregnancy. What are the signs or symptoms? In most cases, GBS infection does not cause symptoms in pregnant women. Signs and symptoms of a possible GBS-related infection may include:  Labor starting before the 37th week of pregnancy.  A UTI or bladder infection, which may cause: ? Fever. ? Pain or burning during urination. ? Frequent urination.  Fever during labor, along with: ? Bad-smelling discharge. ? Uterine tenderness. ? Rapid heartbeat in the mother, baby, or both.  Rare but serious symptoms of a possible GBS-related infection in women include:  Blood infection (septicemia). This may cause fever, chills, or confusion.  Lung infection (pneumonia). This may cause fever, chills, cough, rapid breathing, difficulty breathing, or chest pain.  Bone, joint, skin, or soft tissue infection.  How is this diagnosed? You may be screened for GBS between week 35 and week 37 of your pregnancy. If  you have symptoms of preterm labor, you may be screened earlier. This condition is diagnosed based on lab test results from:  A swab of fluid from the vagina and rectum.  A urine sample.  How is this treated? This condition is treated with antibiotic medicine. When you go into labor, or as soon as your water breaks (your membranes rupture), you will be given antibiotics through an IV tube. Antibiotics will continue until after you give birth. If you are having a cesarean delivery, you do not need antibiotics unless your membranes have already ruptured. Follow these instructions at home:  Take over-the-counter and prescription medicines only as told by your health care provider.  Take your antibiotic medicine as told by your health care provider. Do not stop taking the antibiotic even if you start to feel better.  Keep all pre-birth (prenatal) visits and follow-up visits as told by your health care provider. This is important. Contact a health care provider if:  You have pain or burning when you urinate.  You have to urinate frequently.  You have a fever or chills.  You develop a bad-smelling vaginal discharge. Get help right away if:  Your membranes rupture.  You go into labor.  You have severe pain in your abdomen.  You have difficulty breathing.  You have chest pain. This information is not intended to replace advice given to you by your health care provider. Make sure you discuss any questions you have with your health care provider. Document Released: 01/25/2008 Document Revised: 05/14/2016 Document Reviewed: 05/13/2016 Elsevier Interactive Patient Education  2018 [employer]  

## 2017-09-06 ENCOUNTER — Ambulatory Visit (INDEPENDENT_AMBULATORY_CARE_PROVIDER_SITE_OTHER): Payer: Medicaid Other | Admitting: Certified Nurse Midwife

## 2017-09-06 VITALS — BP 90/66 | HR 93 | Wt 153.4 lb

## 2017-09-06 DIAGNOSIS — Z369 Encounter for antenatal screening, unspecified: Secondary | ICD-10-CM

## 2017-09-06 DIAGNOSIS — Z3493 Encounter for supervision of normal pregnancy, unspecified, third trimester: Secondary | ICD-10-CM

## 2017-09-06 DIAGNOSIS — Z131 Encounter for screening for diabetes mellitus: Secondary | ICD-10-CM

## 2017-09-06 DIAGNOSIS — Z113 Encounter for screening for infections with a predominantly sexual mode of transmission: Secondary | ICD-10-CM

## 2017-09-06 LAB — POCT URINALYSIS DIPSTICK
Bilirubin, UA: NEGATIVE
Blood, UA: NEGATIVE
Glucose, UA: NEGATIVE
Ketones, UA: NEGATIVE
Leukocytes, UA: NEGATIVE
Nitrite, UA: NEGATIVE
PROTEIN UA: NEGATIVE
SPEC GRAV UA: 1.01 (ref 1.010–1.025)
UROBILINOGEN UA: 0.2 U/dL
pH, UA: 6 (ref 5.0–8.0)

## 2017-09-06 NOTE — Progress Notes (Signed)
ROB-Reports lower back pain. Discussed home treatment measures including the use of abdominal support. 36 week cultures collected today. Requests SVE. Anticipatory guidance regarding course of prenatal care. Reviewed red flag symptoms and when to call. RTC x 1 week for ROB or sooner if needed.

## 2017-09-06 NOTE — Addendum Note (Signed)
Addended by: Earl Lagos on: 09/06/2017 04:13 PM   Modules accepted: Orders

## 2017-09-06 NOTE — Patient Instructions (Signed)
Vaginal Delivery Vaginal delivery means that you will give birth by pushing your baby out of your birth canal (vagina). A team of health care providers will help you before, during, and after vaginal delivery. Birth experiences are unique for every woman and every pregnancy, and birth experiences vary depending on where you choose to give birth. What should I do to prepare for my baby's birth? Before your baby is born, it is important to talk with your health care provider about:  Your labor and delivery preferences. These may include: ? Medicines that you may be given. ? How you will manage your pain. This might include non-medical pain relief techniques or injectable pain relief such as epidural analgesia. ? How you and your baby will be monitored during labor and delivery. ? Who may be in the labor and delivery room with you. ? Your feelings about surgical delivery of your baby (cesarean delivery, or C-section) if this becomes necessary. ? Your feelings about receiving donated blood through an IV tube (blood transfusion) if this becomes necessary.  Whether you are able: ? To take pictures or videos of the birth. ? To eat during labor and delivery. ? To move around, walk, or change positions during labor and delivery.  What to expect after your baby is born, such as: ? Whether delayed umbilical cord clamping and cutting is offered. ? Who will care for your baby right after birth. ? Medicines or tests that may be recommended for your baby. ? Whether breastfeeding is supported in your hospital or birth center. ? How long you will be in the hospital or birth center.  How any medical conditions you have may affect your baby or your labor and delivery experience.  To prepare for your baby's birth, you should also:  Attend all of your health care visits before delivery (prenatal visits) as recommended by your health care provider. This is important.  Prepare your home for your baby's  arrival. Make sure that you have: ? Diapers. ? Baby clothing. ? Feeding equipment. ? Safe sleeping arrangements for you and your baby.  Install a car seat in your vehicle. Have your car seat checked by a certified car seat installer to make sure that it is installed safely.  Think about who will help you with your new baby at home for at least the first several weeks after delivery.  What can I expect when I arrive at the birth center or hospital? Once you are in labor and have been admitted into the hospital or birth center, your health care provider may:  Review your pregnancy history and any concerns you have.  Insert an IV tube into one of your veins. This is used to give you fluids and medicines.  Check your blood pressure, pulse, temperature, and heart rate (vital signs).  Check whether your bag of water (amniotic sac) has broken (ruptured).  Talk with you about your birth plan and discuss pain control options.  Monitoring Your health care provider may monitor your contractions (uterine monitoring) and your baby's heart rate (fetal monitoring). You may need to be monitored:  Often, but not continuously (intermittently).  All the time or for long periods at a time (continuously). Continuous monitoring may be needed if: ? You are taking certain medicines, such as medicine to relieve pain or make your contractions stronger. ? You have pregnancy or labor complications.  Monitoring may be done by:  Placing a special stethoscope or a handheld monitoring device on your abdomen to   check your baby's heartbeat, and feeling your abdomen for contractions. This method of monitoring does not continuously record your baby's heartbeat or your contractions.  Placing monitors on your abdomen (external monitors) to record your baby's heartbeat and the frequency and length of contractions. You may not have to wear external monitors all the time.  Placing monitors inside of your uterus  (internal monitors) to record your baby's heartbeat and the frequency, length, and strength of your contractions. ? Your health care provider may use internal monitors if he or she needs more information about the strength of your contractions or your baby's heart rate. ? Internal monitors are put in place by passing a thin, flexible wire through your vagina and into your uterus. Depending on the type of monitor, it may remain in your uterus or on your baby's head until birth. ? Your health care provider will discuss the benefits and risks of internal monitoring with you and will ask for your permission before inserting the monitors.  Telemetry. This is a type of continuous monitoring that can be done with external or internal monitors. Instead of having to stay in bed, you are able to move around during telemetry. Ask your health care provider if telemetry is an option for you.  Physical exam Your health care provider may perform a physical exam. This may include:  Checking whether your baby is positioned: ? With the head toward your vagina (head-down). This is most common. ? With the head toward the top of your uterus (head-up or breech). If your baby is in a breech position, your health care provider may try to turn your baby to a head-down position so you can deliver vaginally. If it does not seem that your baby can be born vaginally, your provider may recommend surgery to deliver your baby. In rare cases, you may be able to deliver vaginally if your baby is head-up (breech delivery). ? Lying sideways (transverse). Babies that are lying sideways cannot be delivered vaginally.  Checking your cervix to determine: ? Whether it is thinning out (effacing). ? Whether it is opening up (dilating). ? How low your baby has moved into your birth canal.  What are the three stages of labor and delivery?  Normal labor and delivery is divided into the following three stages: Stage 1  Stage 1 is the  longest stage of labor, and it can last for hours or days. Stage 1 includes: ? Early labor. This is when contractions may be irregular, or regular and mild. Generally, early labor contractions are more than 10 minutes apart. ? Active labor. This is when contractions get longer, more regular, more frequent, and more intense. ? The transition phase. This is when contractions happen very close together, are very intense, and may last longer than during any other part of labor.  Contractions generally feel mild, infrequent, and irregular at first. They get stronger, more frequent (about every 2-3 minutes), and more regular as you progress from early labor through active labor and transition.  Many women progress through stage 1 naturally, but you may need help to continue making progress. If this happens, your health care provider may talk with you about: ? Rupturing your amniotic sac if it has not ruptured yet. ? Giving you medicine to help make your contractions stronger and more frequent.  Stage 1 ends when your cervix is completely dilated to 4 inches (10 cm) and completely effaced. This happens at the end of the transition phase. Stage 2  Once   your cervix is completely effaced and dilated to 4 inches (10 cm), you may start to feel an urge to push. It is common for the body to naturally take a rest before feeling the urge to push, especially if you received an epidural or certain other pain medicines. This rest period may last for up to 1-2 hours, depending on your unique labor experience.  During stage 2, contractions are generally less painful, because pushing helps relieve contraction pain. Instead of contraction pain, you may feel stretching and burning pain, especially when the widest part of your baby's head passes through the vaginal opening (crowning).  Your health care provider will closely monitor your pushing progress and your baby's progress through the vagina during stage 2.  Your  health care provider may massage the area of skin between your vaginal opening and anus (perineum) or apply warm compresses to your perineum. This helps it stretch as the baby's head starts to crown, which can help prevent perineal tearing. ? In some cases, an incision may be made in your perineum (episiotomy) to allow the baby to pass through the vaginal opening. An episiotomy helps to make the opening of the vagina larger to allow more room for the baby to fit through.  It is very important to breathe and focus so your health care provider can control the delivery of your baby's head. Your health care provider may have you decrease the intensity of your pushing, to help prevent perineal tearing.  After delivery of your baby's head, the shoulders and the rest of the body generally deliver very quickly and without difficulty.  Once your baby is delivered, the umbilical cord may be cut right away, or this may be delayed for 1-2 minutes, depending on your baby's health. This may vary among health care providers, hospitals, and birth centers.  If you and your baby are healthy enough, your baby may be placed on your chest or abdomen to help maintain the baby's temperature and to help you bond with each other. Some mothers and babies start breastfeeding at this time. Your health care team will dry your baby and help keep your baby warm during this time.  Your baby may need immediate care if he or she: ? Showed signs of distress during labor. ? Has a medical condition. ? Was born too early (prematurely). ? Had a bowel movement before birth (meconium). ? Shows signs of difficulty transitioning from being inside the uterus to being outside of the uterus. If you are planning to breastfeed, your health care team will help you begin a feeding. Stage 3  The third stage of labor starts immediately after the birth of your baby and ends after you deliver the placenta. The placenta is an organ that develops  during pregnancy to provide oxygen and nutrients to your baby in the womb.  Delivering the placenta may require some pushing, and you may have mild contractions. Breastfeeding can stimulate contractions to help you deliver the placenta.  After the placenta is delivered, your uterus should tighten (contract) and become firm. This helps to stop bleeding in your uterus. To help your uterus contract and to control bleeding, your health care provider may: ? Give you medicine by injection, through an IV tube, by mouth, or through your rectum (rectally). ? Massage your abdomen or perform a vaginal exam to remove any blood clots that are left in your uterus. ? Empty your bladder by placing a thin, flexible tube (catheter) into your bladder. ? Encourage   you to breastfeed your baby. After labor is over, you and your baby will be monitored closely to ensure that you are both healthy until you are ready to go home. Your health care team will teach you how to care for yourself and your baby. This information is not intended to replace advice given to you by your health care provider. Make sure you discuss any questions you have with your health care provider. Document Released: 07/27/2008 Document Revised: 05/07/2016 Document Reviewed: 11/02/2015 Elsevier Interactive Patient Education  2018 [employer]  

## 2017-09-08 LAB — STREP GP B NAA: Strep Gp B NAA: POSITIVE — AB

## 2017-09-08 LAB — GC/CHLAMYDIA PROBE AMP
CHLAMYDIA, DNA PROBE: NEGATIVE
NEISSERIA GONORRHOEAE BY PCR: NEGATIVE

## 2017-09-11 ENCOUNTER — Encounter: Payer: Self-pay | Admitting: Certified Nurse Midwife

## 2017-09-13 ENCOUNTER — Other Ambulatory Visit: Payer: Self-pay

## 2017-09-13 ENCOUNTER — Encounter: Payer: Self-pay | Admitting: Certified Nurse Midwife

## 2017-09-13 ENCOUNTER — Ambulatory Visit (INDEPENDENT_AMBULATORY_CARE_PROVIDER_SITE_OTHER): Payer: Medicaid Other | Admitting: Certified Nurse Midwife

## 2017-09-13 VITALS — BP 131/65 | HR 68 | Wt 154.5 lb

## 2017-09-13 DIAGNOSIS — Z3483 Encounter for supervision of other normal pregnancy, third trimester: Secondary | ICD-10-CM

## 2017-09-13 LAB — POCT URINALYSIS DIPSTICK
Bilirubin, UA: NEGATIVE
Blood, UA: NEGATIVE
Glucose, UA: NEGATIVE
KETONES UA: NEGATIVE
LEUKOCYTES UA: NEGATIVE
NITRITE UA: NEGATIVE
PH UA: 6 (ref 5.0–8.0)
PROTEIN UA: NEGATIVE
Spec Grav, UA: 1.01 (ref 1.010–1.025)
UROBILINOGEN UA: 0.2 U/dL

## 2017-09-13 NOTE — Progress Notes (Signed)
ROB-Reports back pain and increased pelvic pressures. Discussed home treatment measures. Education regarding GBS and treatment, pt verbalized understanding. Reviewed red flag symptoms and when to call. RTC x 1 week for ROB with Deneise Lever or sooner if needed.

## 2017-09-13 NOTE — Patient Instructions (Signed)
Vaginal Delivery Vaginal delivery means that you will give birth by pushing your baby out of your birth canal (vagina). A team of health care providers will help you before, during, and after vaginal delivery. Birth experiences are unique for every woman and every pregnancy, and birth experiences vary depending on where you choose to give birth. What should I do to prepare for my baby's birth? Before your baby is born, it is important to talk with your health care provider about:  Your labor and delivery preferences. These may include: ? Medicines that you may be given. ? How you will manage your pain. This might include non-medical pain relief techniques or injectable pain relief such as epidural analgesia. ? How you and your baby will be monitored during labor and delivery. ? Who may be in the labor and delivery room with you. ? Your feelings about surgical delivery of your baby (cesarean delivery, or C-section) if this becomes necessary. ? Your feelings about receiving donated blood through an IV tube (blood transfusion) if this becomes necessary.  Whether you are able: ? To take pictures or videos of the birth. ? To eat during labor and delivery. ? To move around, walk, or change positions during labor and delivery.  What to expect after your baby is born, such as: ? Whether delayed umbilical cord clamping and cutting is offered. ? Who will care for your baby right after birth. ? Medicines or tests that may be recommended for your baby. ? Whether breastfeeding is supported in your hospital or birth center. ? How long you will be in the hospital or birth center.  How any medical conditions you have may affect your baby or your labor and delivery experience.  To prepare for your baby's birth, you should also:  Attend all of your health care visits before delivery (prenatal visits) as recommended by your health care provider. This is important.  Prepare your home for your baby's  arrival. Make sure that you have: ? Diapers. ? Baby clothing. ? Feeding equipment. ? Safe sleeping arrangements for you and your baby.  Install a car seat in your vehicle. Have your car seat checked by a certified car seat installer to make sure that it is installed safely.  Think about who will help you with your new baby at home for at least the first several weeks after delivery.  What can I expect when I arrive at the birth center or hospital? Once you are in labor and have been admitted into the hospital or birth center, your health care provider may:  Review your pregnancy history and any concerns you have.  Insert an IV tube into one of your veins. This is used to give you fluids and medicines.  Check your blood pressure, pulse, temperature, and heart rate (vital signs).  Check whether your bag of water (amniotic sac) has broken (ruptured).  Talk with you about your birth plan and discuss pain control options.  Monitoring Your health care provider may monitor your contractions (uterine monitoring) and your baby's heart rate (fetal monitoring). You may need to be monitored:  Often, but not continuously (intermittently).  All the time or for long periods at a time (continuously). Continuous monitoring may be needed if: ? You are taking certain medicines, such as medicine to relieve pain or make your contractions stronger. ? You have pregnancy or labor complications.  Monitoring may be done by:  Placing a special stethoscope or a handheld monitoring device on your abdomen to   check your baby's heartbeat, and feeling your abdomen for contractions. This method of monitoring does not continuously record your baby's heartbeat or your contractions.  Placing monitors on your abdomen (external monitors) to record your baby's heartbeat and the frequency and length of contractions. You may not have to wear external monitors all the time.  Placing monitors inside of your uterus  (internal monitors) to record your baby's heartbeat and the frequency, length, and strength of your contractions. ? Your health care provider may use internal monitors if he or she needs more information about the strength of your contractions or your baby's heart rate. ? Internal monitors are put in place by passing a thin, flexible wire through your vagina and into your uterus. Depending on the type of monitor, it may remain in your uterus or on your baby's head until birth. ? Your health care provider will discuss the benefits and risks of internal monitoring with you and will ask for your permission before inserting the monitors.  Telemetry. This is a type of continuous monitoring that can be done with external or internal monitors. Instead of having to stay in bed, you are able to move around during telemetry. Ask your health care provider if telemetry is an option for you.  Physical exam Your health care provider may perform a physical exam. This may include:  Checking whether your baby is positioned: ? With the head toward your vagina (head-down). This is most common. ? With the head toward the top of your uterus (head-up or breech). If your baby is in a breech position, your health care provider may try to turn your baby to a head-down position so you can deliver vaginally. If it does not seem that your baby can be born vaginally, your provider may recommend surgery to deliver your baby. In rare cases, you may be able to deliver vaginally if your baby is head-up (breech delivery). ? Lying sideways (transverse). Babies that are lying sideways cannot be delivered vaginally.  Checking your cervix to determine: ? Whether it is thinning out (effacing). ? Whether it is opening up (dilating). ? How low your baby has moved into your birth canal.  What are the three stages of labor and delivery?  Normal labor and delivery is divided into the following three stages: Stage 1  Stage 1 is the  longest stage of labor, and it can last for hours or days. Stage 1 includes: ? Early labor. This is when contractions may be irregular, or regular and mild. Generally, early labor contractions are more than 10 minutes apart. ? Active labor. This is when contractions get longer, more regular, more frequent, and more intense. ? The transition phase. This is when contractions happen very close together, are very intense, and may last longer than during any other part of labor.  Contractions generally feel mild, infrequent, and irregular at first. They get stronger, more frequent (about every 2-3 minutes), and more regular as you progress from early labor through active labor and transition.  Many women progress through stage 1 naturally, but you may need help to continue making progress. If this happens, your health care provider may talk with you about: ? Rupturing your amniotic sac if it has not ruptured yet. ? Giving you medicine to help make your contractions stronger and more frequent.  Stage 1 ends when your cervix is completely dilated to 4 inches (10 cm) and completely effaced. This happens at the end of the transition phase. Stage 2  Once   your cervix is completely effaced and dilated to 4 inches (10 cm), you may start to feel an urge to push. It is common for the body to naturally take a rest before feeling the urge to push, especially if you received an epidural or certain other pain medicines. This rest period may last for up to 1-2 hours, depending on your unique labor experience.  During stage 2, contractions are generally less painful, because pushing helps relieve contraction pain. Instead of contraction pain, you may feel stretching and burning pain, especially when the widest part of your baby's head passes through the vaginal opening (crowning).  Your health care provider will closely monitor your pushing progress and your baby's progress through the vagina during stage 2.  Your  health care provider may massage the area of skin between your vaginal opening and anus (perineum) or apply warm compresses to your perineum. This helps it stretch as the baby's head starts to crown, which can help prevent perineal tearing. ? In some cases, an incision may be made in your perineum (episiotomy) to allow the baby to pass through the vaginal opening. An episiotomy helps to make the opening of the vagina larger to allow more room for the baby to fit through.  It is very important to breathe and focus so your health care provider can control the delivery of your baby's head. Your health care provider may have you decrease the intensity of your pushing, to help prevent perineal tearing.  After delivery of your baby's head, the shoulders and the rest of the body generally deliver very quickly and without difficulty.  Once your baby is delivered, the umbilical cord may be cut right away, or this may be delayed for 1-2 minutes, depending on your baby's health. This may vary among health care providers, hospitals, and birth centers.  If you and your baby are healthy enough, your baby may be placed on your chest or abdomen to help maintain the baby's temperature and to help you bond with each other. Some mothers and babies start breastfeeding at this time. Your health care team will dry your baby and help keep your baby warm during this time.  Your baby may need immediate care if he or she: ? Showed signs of distress during labor. ? Has a medical condition. ? Was born too early (prematurely). ? Had a bowel movement before birth (meconium). ? Shows signs of difficulty transitioning from being inside the uterus to being outside of the uterus. If you are planning to breastfeed, your health care team will help you begin a feeding. Stage 3  The third stage of labor starts immediately after the birth of your baby and ends after you deliver the placenta. The placenta is an organ that develops  during pregnancy to provide oxygen and nutrients to your baby in the womb.  Delivering the placenta may require some pushing, and you may have mild contractions. Breastfeeding can stimulate contractions to help you deliver the placenta.  After the placenta is delivered, your uterus should tighten (contract) and become firm. This helps to stop bleeding in your uterus. To help your uterus contract and to control bleeding, your health care provider may: ? Give you medicine by injection, through an IV tube, by mouth, or through your rectum (rectally). ? Massage your abdomen or perform a vaginal exam to remove any blood clots that are left in your uterus. ? Empty your bladder by placing a thin, flexible tube (catheter) into your bladder. ? Encourage   you to breastfeed your baby. After labor is over, you and your baby will be monitored closely to ensure that you are both healthy until you are ready to go home. Your health care team will teach you how to care for yourself and your baby. This information is not intended to replace advice given to you by your health care provider. Make sure you discuss any questions you have with your health care provider. Document Released: 07/27/2008 Document Revised: 05/07/2016 Document Reviewed: 11/02/2015 Elsevier Interactive Patient Education  2018 [employer]  

## 2017-09-20 ENCOUNTER — Ambulatory Visit (INDEPENDENT_AMBULATORY_CARE_PROVIDER_SITE_OTHER): Payer: Medicaid Other | Admitting: Certified Nurse Midwife

## 2017-09-20 ENCOUNTER — Other Ambulatory Visit: Payer: Self-pay

## 2017-09-20 ENCOUNTER — Encounter: Payer: Self-pay | Admitting: Certified Nurse Midwife

## 2017-09-20 VITALS — BP 124/83 | HR 91 | Wt 153.8 lb

## 2017-09-20 DIAGNOSIS — Z3483 Encounter for supervision of other normal pregnancy, third trimester: Secondary | ICD-10-CM

## 2017-09-20 LAB — POCT URINALYSIS DIPSTICK
Bilirubin, UA: NEGATIVE
Blood, UA: NEGATIVE
Glucose, UA: NEGATIVE
Ketones, UA: NEGATIVE
Leukocytes, UA: NEGATIVE
Nitrite, UA: NEGATIVE
Protein, UA: NEGATIVE
Spec Grav, UA: 1.01 (ref 1.010–1.025)
Urobilinogen, UA: 0.2 E.U./dL
pH, UA: 6 (ref 5.0–8.0)

## 2017-09-20 NOTE — Progress Notes (Signed)
Rob doing well. No complaints. SVE not change. Labor precautions reviewed. Follow up 1 wk.

## 2017-09-20 NOTE — Patient Instructions (Signed)

## 2017-09-26 ENCOUNTER — Inpatient Hospital Stay
Admission: EM | Admit: 2017-09-26 | Discharge: 2017-09-26 | Disposition: A | Discharge status: Home / Self Care | Payer: Medicaid Other | Attending: Obstetrics and Gynecology | Admitting: Obstetrics and Gynecology

## 2017-09-26 DIAGNOSIS — O479 False labor, unspecified: Secondary | ICD-10-CM

## 2017-09-26 DIAGNOSIS — Z3A39 39 weeks gestation of pregnancy: Secondary | ICD-10-CM | POA: Diagnosis not present

## 2017-09-26 DIAGNOSIS — O471 False labor at or after 37 completed weeks of gestation: Secondary | ICD-10-CM

## 2017-09-26 NOTE — Progress Notes (Signed)
Per Kathleen Glass, may allow pt to ambulate for 30 minutes and re-evaluate ctx pattern

## 2017-09-26 NOTE — Discharge Instructions (Signed)

## 2017-09-26 NOTE — OB Triage Provider Note (Signed)
L&D OB Triage Note  Kathleen Glass is a 21 y.o. G1P0 female at [redacted]w[redacted]d, EDD Estimated Date of Delivery: 10/03/17 who presented to triage for complaints of regular contractions this afternoon, less since coming in to the hospital..  She was evaluated by me with no significant findings/findings significant for active labor. Vital signs stable. An NST was performed and has been reviewed by myself. She was reasured.   NST INTERPRETATION: Indications: rule out uterine contractions  Mode: External Baseline Rate (A): 169 bpm(fht) Variability: Moderate Accelerations: 15 x 15 Decelerations: None     Contraction Frequency (min): UI  Impression: reactive   Plan: NST performed was reviewed and was found to be reactive. She was discharged home with bleeding/labor precautions.  Continue routine prenatal care. Follow up with OB/GYN as previously scheduled.     [name], CNM

## 2017-09-26 NOTE — OB Triage Note (Signed)
Kathleen Glass here with c/o ctx that started approx 1600, reports they are 3 min apart, lasting 60-90 seconds, reports positive fetal movement, denies bleeding, LOF.

## 2017-09-28 ENCOUNTER — Ambulatory Visit (INDEPENDENT_AMBULATORY_CARE_PROVIDER_SITE_OTHER): Payer: Medicaid Other | Admitting: Obstetrics and Gynecology

## 2017-09-28 VITALS — BP 128/62 | HR 106 | Wt 160.8 lb

## 2017-09-28 DIAGNOSIS — Z3493 Encounter for supervision of normal pregnancy, unspecified, third trimester: Secondary | ICD-10-CM

## 2017-09-28 LAB — POCT URINALYSIS DIPSTICK
BILIRUBIN UA: NEGATIVE
Blood, UA: NEGATIVE
Glucose, UA: NEGATIVE
Ketones, UA: NEGATIVE
NITRITE UA: NEGATIVE
PH UA: 7.5 (ref 5.0–8.0)
PROTEIN UA: NEGATIVE
Spec Grav, UA: 1.01 (ref 1.010–1.025)
Urobilinogen, UA: 0.2 E.U./dL

## 2017-09-28 NOTE — Patient Instructions (Signed)
Nonstress Test The nonstress test is a procedure that monitors the fetus's heartbeat. The test will monitor the heartbeat when the fetus is at rest and while the fetus is moving. In a healthy fetus, there will be an increase in fetal heart rate when the fetus moves or kicks. The heart rate will decrease at rest. This test helps determine if the fetus is healthy. Your health care provider will look at a number of patterns in the heart rate tracing to make sure your baby is thriving. If there is concern, your health care provider may order additional tests or may suggest another course of action. This test is often done in the third trimester and can help determine if an early delivery is needed and safe. Common reasons to have this test are:  You are past your due date.  You have a high-risk pregnancy.  You are feeling less movement than normal.  You have lost a pregnancy in the past.  Your health care provider suspects fetal growth problems.  You have too much or too little amniotic fluid.  What happens before the procedure?  Eat a meal right before the test or as directed by your health care provider. Food may help stimulate fetal movements.  Use the restroom right before the test. What happens during the procedure?  Two belts will be placed around your abdomen. These belts have monitors attached to them. One records the fetal heart rate and the other records uterine contractions.  You may be asked to lie down on your side or to stay sitting upright.  You may be given a button to press when you feel movement.  The fetal heartbeat is listened to and watched on a screen. The heartbeat is recorded on a sheet of paper.  If the fetus seems to be sleeping, you may be asked to drink some juice or soda, gently press your abdomen, or make some noise to wake the fetus. What happens after the procedure? Your health care provider will discuss the test results with you and make recommendations  for the near future.  This information is not intended to replace advice given to you by your health care provider. Make sure you discuss any questions you have with your health care provider. This information is not intended to replace advice given to you by your health care provider. Make sure you discuss any questions you have with your health care provider. Document Released: 10/08/2002 Document Revised: 09/17/2016 Document Reviewed: 11/21/2012 Elsevier Interactive Patient Education  2018 [employer]  

## 2017-09-28 NOTE — Progress Notes (Signed)
ROB-labor precautions discussed. Postdates care discussed.

## 2017-09-28 NOTE — Progress Notes (Signed)
ROB- pt is having a lot of pelvic pressure, having some contractions 

## 2017-10-03 ENCOUNTER — Ambulatory Visit (INDEPENDENT_AMBULATORY_CARE_PROVIDER_SITE_OTHER): Payer: Medicaid Other | Admitting: Certified Nurse Midwife

## 2017-10-03 ENCOUNTER — Other Ambulatory Visit: Payer: Self-pay | Admitting: Certified Nurse Midwife

## 2017-10-03 ENCOUNTER — Observation Stay
Admission: EM | Admit: 2017-10-03 | Discharge: 2017-10-03 | Disposition: A | Discharge status: Home / Self Care | Payer: Medicaid Other | Attending: Certified Nurse Midwife | Admitting: Certified Nurse Midwife

## 2017-10-03 ENCOUNTER — Other Ambulatory Visit: Payer: Self-pay

## 2017-10-03 ENCOUNTER — Observation Stay
Admission: EM | Admit: 2017-10-03 | Discharge: 2017-10-03 | Disposition: A | Discharge status: Home / Self Care | Payer: Medicaid Other | Source: Home / Self Care | Admitting: Obstetrics and Gynecology

## 2017-10-03 VITALS — BP 142/94 | HR 97 | Wt 157.6 lb

## 2017-10-03 DIAGNOSIS — Z3493 Encounter for supervision of normal pregnancy, unspecified, third trimester: Secondary | ICD-10-CM

## 2017-10-03 DIAGNOSIS — O163 Unspecified maternal hypertension, third trimester: Secondary | ICD-10-CM

## 2017-10-03 DIAGNOSIS — Z3A40 40 weeks gestation of pregnancy: Secondary | ICD-10-CM | POA: Insufficient documentation

## 2017-10-03 DIAGNOSIS — Z3490 Encounter for supervision of normal pregnancy, unspecified, unspecified trimester: Secondary | ICD-10-CM

## 2017-10-03 DIAGNOSIS — R030 Elevated blood-pressure reading, without diagnosis of hypertension: Secondary | ICD-10-CM | POA: Insufficient documentation

## 2017-10-03 DIAGNOSIS — O26893 Other specified pregnancy related conditions, third trimester: Secondary | ICD-10-CM | POA: Insufficient documentation

## 2017-10-03 DIAGNOSIS — O480 Post-term pregnancy: Secondary | ICD-10-CM

## 2017-10-03 DIAGNOSIS — R51 Headache: Secondary | ICD-10-CM | POA: Insufficient documentation

## 2017-10-03 LAB — COMPREHENSIVE METABOLIC PANEL
ALK PHOS: 224 U/L — AB (ref 38–126)
ALT: 13 U/L — AB (ref 14–54)
AST: 25 U/L (ref 15–41)
Albumin: 2.7 g/dL — ABNORMAL LOW (ref 3.5–5.0)
Anion gap: 8 (ref 5–15)
BUN: 11 mg/dL (ref 6–20)
CALCIUM: 9.4 mg/dL (ref 8.9–10.3)
CO2: 22 mmol/L (ref 22–32)
Chloride: 107 mmol/L (ref 101–111)
Creatinine, Ser: 0.51 mg/dL (ref 0.44–1.00)
Glucose, Bld: 85 mg/dL (ref 65–99)
Potassium: 3.9 mmol/L (ref 3.5–5.1)
Sodium: 137 mmol/L (ref 135–145)
Total Bilirubin: 0.4 mg/dL (ref 0.3–1.2)
Total Protein: 6.7 g/dL (ref 6.5–8.1)

## 2017-10-03 LAB — POCT URINALYSIS DIPSTICK
Bilirubin, UA: NEGATIVE
Blood, UA: NEGATIVE
GLUCOSE UA: NEGATIVE
Ketones, UA: NEGATIVE
Leukocytes, UA: NEGATIVE
NITRITE UA: NEGATIVE
PROTEIN UA: NEGATIVE
Spec Grav, UA: 1.015 (ref 1.010–1.025)
UROBILINOGEN UA: 0.2 U/dL
pH, UA: 6.5 (ref 5.0–8.0)

## 2017-10-03 LAB — CBC
HEMATOCRIT: 35 % (ref 35.0–47.0)
HEMOGLOBIN: 11.5 g/dL — AB (ref 12.0–16.0)
MCH: 25.9 pg — AB (ref 26.0–34.0)
MCHC: 33 g/dL (ref 32.0–36.0)
MCV: 78.5 fL — AB (ref 80.0–100.0)
Platelets: 250 10*3/uL (ref 150–440)
RBC: 4.46 MIL/uL (ref 3.80–5.20)
RDW: 12.8 % (ref 11.5–14.5)
WBC: 11.9 10*3/uL — ABNORMAL HIGH (ref 3.6–11.0)

## 2017-10-03 LAB — PROTEIN / CREATININE RATIO, URINE
CREATININE, URINE: 72 mg/dL
Protein Creatinine Ratio: 0.1 mg/mg{Cre} (ref 0.00–0.15)
TOTAL PROTEIN, URINE: 7 mg/dL

## 2017-10-03 MED ORDER — ACETAMINOPHEN 500 MG PO TABS: 1000.0000 mg | ORAL_TABLET | Freq: Once | ORAL | Status: DC

## 2017-10-03 MED ORDER — OXYCODONE-ACETAMINOPHEN 5-325 MG PO TABS
ORAL_TABLET | ORAL | Status: AC
  Filled 2017-10-03: qty 1

## 2017-10-03 MED ORDER — OXYCODONE-ACETAMINOPHEN 5-325 MG PO TABS
1.0000 | ORAL_TABLET | Freq: Once | ORAL | Status: AC
  Administered 2017-10-03: 1 via ORAL

## 2017-10-03 NOTE — Patient Instructions (Signed)
Preeclampsia and Eclampsia °Preeclampsia is a serious condition that develops only during pregnancy. It is also called toxemia of pregnancy. This condition causes high blood pressure along with other symptoms, such as swelling and headaches. These symptoms may develop as the condition gets worse. Preeclampsia may occur at 20 weeks of pregnancy or later. °Diagnosing and treating preeclampsia early is very important. If not treated early, it can cause serious problems for you and your baby. One problem it can lead to is eclampsia, which is a condition that causes muscle jerking or shaking (convulsions or seizures) in the mother. Delivering your baby is the best treatment for preeclampsia or eclampsia. Preeclampsia and eclampsia symptoms usually go away after your baby is born. °What are the causes? °The cause of preeclampsia is not known. °What increases the risk? °The following risk factors make you more likely to develop preeclampsia: °· Being pregnant for the first time. °· Having had preeclampsia during a past pregnancy. °· Having a family history of preeclampsia. °· Having high blood pressure. °· Being pregnant with twins or triplets. °· Being 35 or older. °· Being African-American. °· Having kidney disease or diabetes. °· Having medical conditions such as lupus or blood diseases. °· Being very overweight (obese). ° °What are the signs or symptoms? °The earliest signs of preeclampsia are: °· High blood pressure. °· Increased protein in your urine. Your health care provider will check for this at every visit before you give birth (prenatal visit). ° °Other symptoms that may develop as the condition gets worse include: °· Severe headaches. °· Sudden weight gain. °· Swelling of the hands, face, legs, and feet. °· Nausea and vomiting. °· Vision problems, such as blurred or double vision. °· Numbness in the face, arms, legs, and feet. °· Urinating less than usual. °· Dizziness. °· Slurred speech. °· Abdominal pain,  especially upper abdominal pain. °· Convulsions or seizures. ° °Symptoms generally go away after giving birth. °How is this diagnosed? °There are no screening tests for preeclampsia. Your health care provider will ask you about symptoms and check for signs of preeclampsia during your prenatal visits. You may also have tests that include: °· Urine tests. °· Blood tests. °· Checking your blood pressure. °· Monitoring your baby’s heart rate. °· Ultrasound. ° °How is this treated? °You and your health care provider will determine the treatment approach that is best for you. Treatment may include: °· Having more frequent prenatal exams to check for signs of preeclampsia, if you have an increased risk for preeclampsia. °· Bed rest. °· Reducing how much salt (sodium) you eat. °· Medicine to lower your blood pressure. °· Staying in the hospital, if your condition is severe. There, treatment will focus on controlling your blood pressure and the amount of fluids in your body (fluid retention). °· You may need to take medicine (magnesium sulfate) to prevent seizures. This medicine may be given as an injection or through an IV tube. °· Delivering your baby early, if your condition gets worse. You may have your labor started with medicine (induced), or you may have a cesarean delivery. ° °Follow these instructions at home: °Eating and drinking ° °· Drink enough fluid to keep your urine clear or pale yellow. °· Eat a healthy diet that is low in sodium. Do not add salt to your food. Check nutrition labels to see how much sodium a food or beverage contains. °· Avoid caffeine. °Lifestyle °· Do not use any products that contain nicotine or tobacco, such as cigarettes   and e-cigarettes. If you need help quitting, ask your health care provider. °· Do not use alcohol or drugs. °· Avoid stress as much as possible. Rest and get plenty of sleep. °General instructions °· Take over-the-counter and prescription medicines only as told by your  health care provider. °· When lying down, lie on your side. This keeps pressure off of your baby. °· When sitting or lying down, raise (elevate) your feet. Try putting some pillows underneath your lower legs. °· Exercise regularly. Ask your health care provider what kinds of exercise are best for you. °· Keep all follow-up and prenatal visits as told by your health care provider. This is important. °How is this prevented? °To prevent preeclampsia or eclampsia from developing during another pregnancy: °· Get proper medical care during pregnancy. Your health care provider may be able to prevent preeclampsia or diagnose and treat it early. °· Your health care provider may have you take a low-dose aspirin or a calcium supplement during your next pregnancy. °· You may have tests of your blood pressure and kidney function after giving birth. °· Maintain a healthy weight. Ask your health care provider for help managing weight gain during pregnancy. °· Work with your health care provider to manage any long-term (chronic) health conditions you have, such as diabetes or kidney problems. ° °Contact a health care provider if: °· You gain more weight than expected. °· You have headaches. °· You have nausea or vomiting. °· You have abdominal pain. °· You feel dizzy or light-headed. °Get help right away if: °· You develop sudden or severe swelling anywhere in your body. This usually happens in the legs. °· You gain 5 lbs (2.3 kg) or more during one week. °· You have severe: °? Abdominal pain. °? Headaches. °? Dizziness. °? Vision problems. °? Confusion. °? Nausea or vomiting. °· You have a seizure. °· You have trouble moving any part of your body. °· You develop numbness in any part of your body. °· You have trouble speaking. °· You have any abnormal bleeding. °· You pass out. °This information is not intended to replace advice given to you by your health care provider. Make sure you discuss any questions you have with your health  care provider. °Document Released: 10/15/2000 Document Revised: 06/15/2016 Document Reviewed: 05/24/2016 °Elsevier Interactive Patient Education © 2018 [employer] ° °

## 2017-10-03 NOTE — OB Triage Note (Signed)
Patient came in for observation for labor evaluation. Patient reports uterine contractions every five minutes. Patient also reports an aching headache that she rates 8/10. Patient denies leaking of fluid, denies vaginal bleeding and spotting. Patient reports + FM.  Vital signs stable and patient afebrile. FHR baseline 125 with moderate variability with accelerations 15 x 15 and no decelerations. Family at bedside. Will continue to monitor.

## 2017-10-03 NOTE — Progress Notes (Signed)
ROB, BP elevated today. She complains of a headache that started today. She has taken Tylenol with no relief. Reflexes 2+ bilaterally, no clonus, no epigastric pain. She also complains of visual changes ( blurred vision). Patient sent to [facility] for NST and stat [facility] labs.  Philip Aspen, CNM

## 2017-10-03 NOTE — Discharge Summary (Signed)
Patient discharged with instructions on follow up appointment, labor precautions, pain management, and when to seek medical attention. Patient reports + FM at discharge. Patient discharged with family. Patient was given 5 mg Percocet, but refused to be discharged via wheelchair to the visitor entrance. Patient mother at bedside and states she will be driving her home. Patient verbalized discharge instructions.

## 2017-10-03 NOTE — OB Triage Note (Signed)
Pt sent over from office for elevated BP. Pt states she has h/a, denies blurry vision, seeing spots, and RUQ pain. Pt has BLE edema non pitting, +1 reflexes and clonus. Monitors applied and assessing.

## 2017-10-03 NOTE — Discharge Summary (Signed)
Obstetric Discharge Summary  Patient ID: Kathleen Glass MRN: [id number] DOB/AGE: 1995-11-26 21 y.o.   Date of Admission: 10/03/2017 Kathleen Glass, CNM Kathleen Pean, MD)  Date of Discharge: 10/03/2017 Kathleen Glass, CNM Kathleen Pean, MD)  Admitting Diagnosis: Observation at [redacted]w[redacted]d  Secondary Diagnosis: Elevated blood pressure and headache   Antepartum procedures: NST   Brief Hospital Course   L&D OB Triage Note  Kathleen Glass is a 21 y.o. G1P0 female at [redacted]w[redacted]d, EDD Estimated Date of Delivery: 10/03/17 who presented to triage for complaints of headache, elevated blood pressure, and  blurred vision during office visit.  She was evaluated by the nurses with no significant findings preeclampsia, fetal distress, or labor. Vital signs stable. Labs within normal limits. An NST was performed and has been reviewed by CNM. No treatment was required since patient took Tylenol PO prior to office visit this morning and all symptoms have resolved.    NST INTERPRETATION:  Indications: Elevated blood pressure affecting pregnancy, third trimester  Mode: External Baseline Rate (A): 145 bpm Variability: Moderate Accelerations: 15 x 15 Decelerations: None  Contraction Frequency (min): 1-4  Impression: reactive   Plan: NST performed was reviewed and was found to be reactive. She was discharged home with bleeding/labor precautions.  Continue routine prenatal care. Follow up with CNM as previously scheduled for BPP and ROB on Thursday or sooner if needed.    Discharge Instructions: Per After Visit Summary.  Activity: Refer to After Visit Summary.  Diet: Regular  Medications:  Allergies as of 10/03/2017   No Known Allergies     Medication List    TAKE these medications   Prenatal Vitamins 0.8 MG tablet Take 1 tablet by mouth daily.   ranitidine 150 MG capsule Commonly known as:  ZANTAC Take 1 capsule (150 mg total) by mouth 2 (two) times daily.      Outpatient follow up:  Follow-up  Information    Diona Fanti, CNM Follow up in 3 day(s).   Specialties:  Certified Nurse Midwife, Obstetrics and Gynecology, Radiology Why:  Please keep scheduled appointment for Thursday, 10/06/2017. Thanks.  Contact information: [address] [place] [address] [phone]          Postpartum contraception: Nexplanon  Discharged Condition: stable  Discharged to: home   Diona Fanti, CNM [facility], [facility]

## 2017-10-03 NOTE — Progress Notes (Signed)
ROB- pt is having some headaches, pelvic pressure, back pain, some contractions

## 2017-10-06 ENCOUNTER — Inpatient Hospital Stay
Admission: EM | Admit: 2017-10-06 | Discharge: 2017-10-09 | DRG: 807 | Disposition: A | Discharge status: Home / Self Care | Payer: Medicaid Other | Attending: Obstetrics and Gynecology | Admitting: Obstetrics and Gynecology

## 2017-10-06 ENCOUNTER — Ambulatory Visit (INDEPENDENT_AMBULATORY_CARE_PROVIDER_SITE_OTHER): Payer: Medicaid Other | Admitting: Certified Nurse Midwife

## 2017-10-06 ENCOUNTER — Ambulatory Visit (INDEPENDENT_AMBULATORY_CARE_PROVIDER_SITE_OTHER): Payer: Medicaid Other

## 2017-10-06 ENCOUNTER — Other Ambulatory Visit: Payer: Self-pay

## 2017-10-06 ENCOUNTER — Encounter: Payer: Self-pay | Admitting: *Deleted

## 2017-10-06 ENCOUNTER — Encounter: Payer: Self-pay | Admitting: Certified Nurse Midwife

## 2017-10-06 VITALS — BP 119/81 | HR 76 | Wt 158.2 lb

## 2017-10-06 DIAGNOSIS — Z3493 Encounter for supervision of normal pregnancy, unspecified, third trimester: Secondary | ICD-10-CM

## 2017-10-06 DIAGNOSIS — Z87891 Personal history of nicotine dependence: Secondary | ICD-10-CM

## 2017-10-06 DIAGNOSIS — Z3A40 40 weeks gestation of pregnancy: Secondary | ICD-10-CM

## 2017-10-06 DIAGNOSIS — O9081 Anemia of the puerperium: Secondary | ICD-10-CM | POA: Diagnosis not present

## 2017-10-06 DIAGNOSIS — O480 Post-term pregnancy: Principal | ICD-10-CM | POA: Diagnosis present

## 2017-10-06 DIAGNOSIS — Z3490 Encounter for supervision of normal pregnancy, unspecified, unspecified trimester: Secondary | ICD-10-CM

## 2017-10-06 DIAGNOSIS — O99824 Streptococcus B carrier state complicating childbirth: Secondary | ICD-10-CM | POA: Diagnosis present

## 2017-10-06 LAB — POCT URINALYSIS DIPSTICK
Bilirubin, UA: NEGATIVE
Glucose, UA: NEGATIVE
KETONES UA: NEGATIVE
Leukocytes, UA: NEGATIVE
Nitrite, UA: NEGATIVE
PROTEIN UA: NEGATIVE
RBC UA: NEGATIVE
SPEC GRAV UA: 1.02 (ref 1.010–1.025)
Urobilinogen, UA: 0.2 E.U./dL
pH, UA: 7 (ref 5.0–8.0)

## 2017-10-06 MED ORDER — MORPHINE SULFATE (PF) 4 MG/ML IV SOLN
4.0000 mg | Freq: Once | INTRAVENOUS | Status: AC
  Administered 2017-10-07: 4 mg via INTRAMUSCULAR
  Filled 2017-10-06: qty 1

## 2017-10-06 MED ORDER — PROMETHAZINE HCL 25 MG/ML IJ SOLN
12.5000 mg | Freq: Once | INTRAMUSCULAR | Status: AC
  Administered 2017-10-07: 12.5 mg via INTRAMUSCULAR
  Filled 2017-10-06: qty 1

## 2017-10-06 NOTE — OB Triage Note (Signed)
Pt arrived from home with complaints of CTX for the last hour. Pt denies any leaking of fluid or vaginal bleeding. Pt reports positive fetal movement. Pt reports being checked in the office today and she was dilated 1.5 cm

## 2017-10-06 NOTE — Progress Notes (Signed)
ROB-Reports leakage of fluid since intercourse last night. Negative pooling, fern and nitrazine. BPP 8/8, fluid low normal. SVE unchanged. IOL scheduled for Wednesday, 12/12 at 0900. Reviewed red flag symptoms and when to call. RTC x Monday for NST and ROB with [facility].   ULTRASOUND REPORT  Location: [facility] Date of Service:  10/06/17  Indications: BPP for Post-Dates Findings:  Singleton intrauterine pregnancy is visualized with FHR at 131 BPM.  Fetal presentation is vertex.  Placenta: Posterior and grade 3. AFI: Within lower limits of normal at 5.2 cm.  BPP: 8/8 with good visualization of fetal movement, tone, breathing, and fluid.  Fetal stomach, kidneys, and bladder appears WNL.  Impression: 1. BPP: 8/8  Recommendations: 1.Clinical correlation with the patient's History and Physical Exam.

## 2017-10-06 NOTE — Patient Instructions (Signed)
Vaginal Delivery Vaginal delivery means that you will give birth by pushing your baby out of your birth canal (vagina). A team of health care providers will help you before, during, and after vaginal delivery. Birth experiences are unique for every woman and every pregnancy, and birth experiences vary depending on where you choose to give birth. What should I do to prepare for my baby's birth? Before your baby is born, it is important to talk with your health care provider about:  Your labor and delivery preferences. These may include: ? Medicines that you may be given. ? How you will manage your pain. This might include non-medical pain relief techniques or injectable pain relief such as epidural analgesia. ? How you and your baby will be monitored during labor and delivery. ? Who may be in the labor and delivery room with you. ? Your feelings about surgical delivery of your baby (cesarean delivery, or C-section) if this becomes necessary. ? Your feelings about receiving donated blood through an IV tube (blood transfusion) if this becomes necessary.  Whether you are able: ? To take pictures or videos of the birth. ? To eat during labor and delivery. ? To move around, walk, or change positions during labor and delivery.  What to expect after your baby is born, such as: ? Whether delayed umbilical cord clamping and cutting is offered. ? Who will care for your baby right after birth. ? Medicines or tests that may be recommended for your baby. ? Whether breastfeeding is supported in your hospital or birth center. ? How long you will be in the hospital or birth center.  How any medical conditions you have may affect your baby or your labor and delivery experience.  To prepare for your baby's birth, you should also:  Attend all of your health care visits before delivery (prenatal visits) as recommended by your health care provider. This is important.  Prepare your home for your baby's  arrival. Make sure that you have: ? Diapers. ? Baby clothing. ? Feeding equipment. ? Safe sleeping arrangements for you and your baby.  Install a car seat in your vehicle. Have your car seat checked by a certified car seat installer to make sure that it is installed safely.  Think about who will help you with your new baby at home for at least the first several weeks after delivery.  What can I expect when I arrive at the birth center or hospital? Once you are in labor and have been admitted into the hospital or birth center, your health care provider may:  Review your pregnancy history and any concerns you have.  Insert an IV tube into one of your veins. This is used to give you fluids and medicines.  Check your blood pressure, pulse, temperature, and heart rate (vital signs).  Check whether your bag of water (amniotic sac) has broken (ruptured).  Talk with you about your birth plan and discuss pain control options.  Monitoring Your health care provider may monitor your contractions (uterine monitoring) and your baby's heart rate (fetal monitoring). You may need to be monitored:  Often, but not continuously (intermittently).  All the time or for long periods at a time (continuously). Continuous monitoring may be needed if: ? You are taking certain medicines, such as medicine to relieve pain or make your contractions stronger. ? You have pregnancy or labor complications.  Monitoring may be done by:  Placing a special stethoscope or a handheld monitoring device on your abdomen to   check your baby's heartbeat, and feeling your abdomen for contractions. This method of monitoring does not continuously record your baby's heartbeat or your contractions.  Placing monitors on your abdomen (external monitors) to record your baby's heartbeat and the frequency and length of contractions. You may not have to wear external monitors all the time.  Placing monitors inside of your uterus  (internal monitors) to record your baby's heartbeat and the frequency, length, and strength of your contractions. ? Your health care provider may use internal monitors if he or she needs more information about the strength of your contractions or your baby's heart rate. ? Internal monitors are put in place by passing a thin, flexible wire through your vagina and into your uterus. Depending on the type of monitor, it may remain in your uterus or on your baby's head until birth. ? Your health care provider will discuss the benefits and risks of internal monitoring with you and will ask for your permission before inserting the monitors.  Telemetry. This is a type of continuous monitoring that can be done with external or internal monitors. Instead of having to stay in bed, you are able to move around during telemetry. Ask your health care provider if telemetry is an option for you.  Physical exam Your health care provider may perform a physical exam. This may include:  Checking whether your baby is positioned: ? With the head toward your vagina (head-down). This is most common. ? With the head toward the top of your uterus (head-up or breech). If your baby is in a breech position, your health care provider may try to turn your baby to a head-down position so you can deliver vaginally. If it does not seem that your baby can be born vaginally, your provider may recommend surgery to deliver your baby. In rare cases, you may be able to deliver vaginally if your baby is head-up (breech delivery). ? Lying sideways (transverse). Babies that are lying sideways cannot be delivered vaginally.  Checking your cervix to determine: ? Whether it is thinning out (effacing). ? Whether it is opening up (dilating). ? How low your baby has moved into your birth canal.  What are the three stages of labor and delivery?  Normal labor and delivery is divided into the following three stages: Stage 1  Stage 1 is the  longest stage of labor, and it can last for hours or days. Stage 1 includes: ? Early labor. This is when contractions may be irregular, or regular and mild. Generally, early labor contractions are more than 10 minutes apart. ? Active labor. This is when contractions get longer, more regular, more frequent, and more intense. ? The transition phase. This is when contractions happen very close together, are very intense, and may last longer than during any other part of labor.  Contractions generally feel mild, infrequent, and irregular at first. They get stronger, more frequent (about every 2-3 minutes), and more regular as you progress from early labor through active labor and transition.  Many women progress through stage 1 naturally, but you may need help to continue making progress. If this happens, your health care provider may talk with you about: ? Rupturing your amniotic sac if it has not ruptured yet. ? Giving you medicine to help make your contractions stronger and more frequent.  Stage 1 ends when your cervix is completely dilated to 4 inches (10 cm) and completely effaced. This happens at the end of the transition phase. Stage 2  Once   your cervix is completely effaced and dilated to 4 inches (10 cm), you may start to feel an urge to push. It is common for the body to naturally take a rest before feeling the urge to push, especially if you received an epidural or certain other pain medicines. This rest period may last for up to 1-2 hours, depending on your unique labor experience.  During stage 2, contractions are generally less painful, because pushing helps relieve contraction pain. Instead of contraction pain, you may feel stretching and burning pain, especially when the widest part of your baby's head passes through the vaginal opening (crowning).  Your health care provider will closely monitor your pushing progress and your baby's progress through the vagina during stage 2.  Your  health care provider may massage the area of skin between your vaginal opening and anus (perineum) or apply warm compresses to your perineum. This helps it stretch as the baby's head starts to crown, which can help prevent perineal tearing. ? In some cases, an incision may be made in your perineum (episiotomy) to allow the baby to pass through the vaginal opening. An episiotomy helps to make the opening of the vagina larger to allow more room for the baby to fit through.  It is very important to breathe and focus so your health care provider can control the delivery of your baby's head. Your health care provider may have you decrease the intensity of your pushing, to help prevent perineal tearing.  After delivery of your baby's head, the shoulders and the rest of the body generally deliver very quickly and without difficulty.  Once your baby is delivered, the umbilical cord may be cut right away, or this may be delayed for 1-2 minutes, depending on your baby's health. This may vary among health care providers, hospitals, and birth centers.  If you and your baby are healthy enough, your baby may be placed on your chest or abdomen to help maintain the baby's temperature and to help you bond with each other. Some mothers and babies start breastfeeding at this time. Your health care team will dry your baby and help keep your baby warm during this time.  Your baby may need immediate care if he or she: ? Showed signs of distress during labor. ? Has a medical condition. ? Was born too early (prematurely). ? Had a bowel movement before birth (meconium). ? Shows signs of difficulty transitioning from being inside the uterus to being outside of the uterus. If you are planning to breastfeed, your health care team will help you begin a feeding. Stage 3  The third stage of labor starts immediately after the birth of your baby and ends after you deliver the placenta. The placenta is an organ that develops  during pregnancy to provide oxygen and nutrients to your baby in the womb.  Delivering the placenta may require some pushing, and you may have mild contractions. Breastfeeding can stimulate contractions to help you deliver the placenta.  After the placenta is delivered, your uterus should tighten (contract) and become firm. This helps to stop bleeding in your uterus. To help your uterus contract and to control bleeding, your health care provider may: ? Give you medicine by injection, through an IV tube, by mouth, or through your rectum (rectally). ? Massage your abdomen or perform a vaginal exam to remove any blood clots that are left in your uterus. ? Empty your bladder by placing a thin, flexible tube (catheter) into your bladder. ? Encourage   you to breastfeed your baby. After labor is over, you and your baby will be monitored closely to ensure that you are both healthy until you are ready to go home. Your health care team will teach you how to care for yourself and your baby. This information is not intended to replace advice given to you by your health care provider. Make sure you discuss any questions you have with your health care provider. Document Released: 07/27/2008 Document Revised: 05/07/2016 Document Reviewed: 11/02/2015 Elsevier Interactive Patient Education  2018 [employer]  

## 2017-10-06 NOTE — Progress Notes (Signed)
Kathleen Glass, CNM notified of pt arrival and complaint of ctx since 2100. CNM aware of FHR tracing and CTX pattern. CNM notified of [place] per RN. Orders received for morphine and phenergan if the pt wants or the pt may be ambulatory and recheck cervix in 2 hours.

## 2017-10-07 ENCOUNTER — Inpatient Hospital Stay: Payer: Medicaid Other | Admitting: Anesthesiology

## 2017-10-07 ENCOUNTER — Encounter: Payer: Self-pay | Admitting: *Deleted

## 2017-10-07 ENCOUNTER — Other Ambulatory Visit: Payer: Self-pay

## 2017-10-07 DIAGNOSIS — Z87891 Personal history of nicotine dependence: Secondary | ICD-10-CM | POA: Diagnosis not present

## 2017-10-07 DIAGNOSIS — Z3A40 40 weeks gestation of pregnancy: Secondary | ICD-10-CM

## 2017-10-07 DIAGNOSIS — O480 Post-term pregnancy: Principal | ICD-10-CM

## 2017-10-07 DIAGNOSIS — O99824 Streptococcus B carrier state complicating childbirth: Secondary | ICD-10-CM | POA: Diagnosis present

## 2017-10-07 DIAGNOSIS — O9081 Anemia of the puerperium: Secondary | ICD-10-CM | POA: Diagnosis not present

## 2017-10-07 LAB — TYPE AND SCREEN
ABO/RH(D): A POS
ANTIBODY SCREEN: NEGATIVE

## 2017-10-07 LAB — CBC
HCT: 33.2 % — ABNORMAL LOW (ref 35.0–47.0)
Hemoglobin: 11.2 g/dL — ABNORMAL LOW (ref 12.0–16.0)
MCH: 26.2 pg (ref 26.0–34.0)
MCHC: 33.7 g/dL (ref 32.0–36.0)
MCV: 77.9 fL — AB (ref 80.0–100.0)
PLATELETS: 269 10*3/uL (ref 150–440)
RBC: 4.26 MIL/uL (ref 3.80–5.20)
RDW: 13 % (ref 11.5–14.5)
WBC: 19 10*3/uL — AB (ref 3.6–11.0)

## 2017-10-07 LAB — CHLAMYDIA/NGC RT PCR (ARMC ONLY)
CHLAMYDIA TR: NOT DETECTED
N GONORRHOEAE: NOT DETECTED

## 2017-10-07 MED ORDER — FENTANYL 2.5 MCG/ML W/ROPIVACAINE 0.15% IN NS 100 ML EPIDURAL (ARMC)
EPIDURAL | Status: AC
  Administered 2017-10-07: 08:00:00
  Filled 2017-10-07: qty 100

## 2017-10-07 MED ORDER — DIPHENHYDRAMINE HCL 50 MG/ML IJ SOLN: 12.5000 mg | INTRAMUSCULAR | Status: DC | PRN

## 2017-10-07 MED ORDER — OXYTOCIN 40 UNITS IN LACTATED RINGERS INFUSION - SIMPLE MED
2.5000 [IU]/h | INTRAVENOUS | Status: DC
  Administered 2017-10-07: 2.5 [IU]/h via INTRAVENOUS

## 2017-10-07 MED ORDER — TERBUTALINE SULFATE 1 MG/ML IJ SOLN: 0.2500 mg | Freq: Once | INTRAMUSCULAR | Status: DC | PRN

## 2017-10-07 MED ORDER — DIPHENHYDRAMINE HCL 25 MG PO CAPS: 25.0000 mg | ORAL_CAPSULE | Freq: Four times a day (QID) | ORAL | Status: DC | PRN

## 2017-10-07 MED ORDER — ACETAMINOPHEN 325 MG PO TABS: 650.0000 mg | ORAL_TABLET | ORAL | Status: DC | PRN

## 2017-10-07 MED ORDER — ZOLPIDEM TARTRATE 5 MG PO TABS: 5.0000 mg | ORAL_TABLET | Freq: Every evening | ORAL | Status: DC | PRN

## 2017-10-07 MED ORDER — PENICILLIN G POT IN DEXTROSE 60000 UNIT/ML IV SOLN
3.0000 10*6.[IU] | INTRAVENOUS | Status: DC
  Administered 2017-10-07 (×2): 3 10*6.[IU] via INTRAVENOUS
  Filled 2017-10-07 (×10): qty 50

## 2017-10-07 MED ORDER — OXYTOCIN 40 UNITS IN LACTATED RINGERS INFUSION - SIMPLE MED
1.0000 m[IU]/min | INTRAVENOUS | Status: DC
  Administered 2017-10-07: 1 m[IU]/min via INTRAVENOUS
  Filled 2017-10-07: qty 1000

## 2017-10-07 MED ORDER — LACTATED RINGERS IV SOLN
500.0000 mL | Freq: Once | INTRAVENOUS | Status: AC
  Administered 2017-10-07: 500 mL via INTRAVENOUS

## 2017-10-07 MED ORDER — DIBUCAINE 1 % RE OINT: 1.0000 "application " | TOPICAL_OINTMENT | RECTAL | Status: DC | PRN

## 2017-10-07 MED ORDER — FENTANYL 2.5 MCG/ML W/ROPIVACAINE 0.15% IN NS 100 ML EPIDURAL (ARMC)
12.0000 mL/h | EPIDURAL | Status: DC
  Administered 2017-10-07: 12 mL/h via EPIDURAL
  Filled 2017-10-07: qty 100

## 2017-10-07 MED ORDER — EPHEDRINE 5 MG/ML INJ
10.0000 mg | INTRAVENOUS | Status: DC | PRN
  Filled 2017-10-07: qty 2

## 2017-10-07 MED ORDER — PHENYLEPHRINE 40 MCG/ML (10ML) SYRINGE FOR IV PUSH (FOR BLOOD PRESSURE SUPPORT)
80.0000 ug | PREFILLED_SYRINGE | INTRAVENOUS | Status: DC | PRN
  Filled 2017-10-07: qty 5

## 2017-10-07 MED ORDER — MISOPROSTOL 200 MCG PO TABS
ORAL_TABLET | ORAL | Status: AC
  Filled 2017-10-07: qty 4

## 2017-10-07 MED ORDER — BENZOCAINE-MENTHOL 20-0.5 % EX AERO
1.0000 "application " | INHALATION_SPRAY | CUTANEOUS | Status: DC | PRN
  Administered 2017-10-07: 1 via TOPICAL
  Filled 2017-10-07: qty 56

## 2017-10-07 MED ORDER — COCONUT OIL OIL
1.0000 "application " | TOPICAL_OIL | Status: DC | PRN
  Administered 2017-10-07: 1 via TOPICAL
  Filled 2017-10-07 (×2): qty 120

## 2017-10-07 MED ORDER — ONDANSETRON HCL 4 MG/2ML IJ SOLN: 4.0000 mg | INTRAMUSCULAR | Status: DC | PRN

## 2017-10-07 MED ORDER — LIDOCAINE-EPINEPHRINE (PF) 1.5 %-1:200000 IJ SOLN
INTRAMUSCULAR | Status: DC | PRN
  Administered 2017-10-07: 3 mL

## 2017-10-07 MED ORDER — OXYTOCIN 40 UNITS IN LACTATED RINGERS INFUSION - SIMPLE MED
INTRAVENOUS | Status: AC
  Filled 2017-10-07: qty 1000

## 2017-10-07 MED ORDER — OXYCODONE-ACETAMINOPHEN 5-325 MG PO TABS
1.0000 | ORAL_TABLET | Freq: Four times a day (QID) | ORAL | Status: AC | PRN
  Administered 2017-10-07: 2 via ORAL
  Administered 2017-10-08: 1 via ORAL
  Filled 2017-10-07: qty 2
  Filled 2017-10-07: qty 1

## 2017-10-07 MED ORDER — SENNOSIDES-DOCUSATE SODIUM 8.6-50 MG PO TABS
2.0000 | ORAL_TABLET | ORAL | Status: DC
  Administered 2017-10-07 – 2017-10-08 (×2): 2 via ORAL
  Filled 2017-10-07 (×2): qty 2

## 2017-10-07 MED ORDER — SODIUM CHLORIDE 0.9 % IV SOLN
INTRAVENOUS | Status: DC | PRN
  Administered 2017-10-07 (×7): 5 mL via EPIDURAL

## 2017-10-07 MED ORDER — WITCH HAZEL-GLYCERIN EX PADS
1.0000 "application " | MEDICATED_PAD | CUTANEOUS | Status: DC | PRN
  Administered 2017-10-07: 1 via TOPICAL
  Filled 2017-10-07: qty 100

## 2017-10-07 MED ORDER — PENICILLIN G POTASSIUM 5000000 UNITS IJ SOLR
5.0000 10*6.[IU] | Freq: Once | INTRAVENOUS | Status: AC
  Administered 2017-10-07: 5 10*6.[IU] via INTRAVENOUS
  Filled 2017-10-07: qty 5

## 2017-10-07 MED ORDER — LIDOCAINE HCL (PF) 1 % IJ SOLN: 30.0000 mL | INTRAMUSCULAR | Status: DC | PRN

## 2017-10-07 MED ORDER — OXYTOCIN BOLUS FROM INFUSION
500.0000 mL | Freq: Once | INTRAVENOUS | Status: AC
  Administered 2017-10-07: 500 mL via INTRAVENOUS

## 2017-10-07 MED ORDER — ONDANSETRON HCL 4 MG PO TABS: 4.0000 mg | ORAL_TABLET | ORAL | Status: DC | PRN

## 2017-10-07 MED ORDER — LACTATED RINGERS IV SOLN: 500.0000 mL | INTRAVENOUS | Status: DC | PRN

## 2017-10-07 MED ORDER — LACTATED RINGERS IV SOLN
INTRAVENOUS | Status: DC
  Administered 2017-10-07 (×2): via INTRAVENOUS

## 2017-10-07 MED ORDER — IBUPROFEN 600 MG PO TABS
600.0000 mg | ORAL_TABLET | Freq: Four times a day (QID) | ORAL | Status: DC
  Administered 2017-10-07 – 2017-10-09 (×7): 600 mg via ORAL
  Filled 2017-10-07 (×7): qty 1

## 2017-10-07 MED ORDER — SOD CITRATE-CITRIC ACID 500-334 MG/5ML PO SOLN
30.0000 mL | ORAL | Status: DC | PRN
  Filled 2017-10-07: qty 15

## 2017-10-07 MED ORDER — SIMETHICONE 80 MG PO CHEW: 80.0000 mg | CHEWABLE_TABLET | ORAL | Status: DC | PRN

## 2017-10-07 MED ORDER — LIDOCAINE HCL (PF) 1 % IJ SOLN
INTRAMUSCULAR | Status: AC
  Filled 2017-10-07: qty 30

## 2017-10-07 MED ORDER — OXYTOCIN 10 UNIT/ML IJ SOLN
INTRAMUSCULAR | Status: AC
  Filled 2017-10-07: qty 2

## 2017-10-07 MED ORDER — PRENATAL MULTIVITAMIN CH
1.0000 | ORAL_TABLET | Freq: Every day | ORAL | Status: DC
  Administered 2017-10-08 – 2017-10-09 (×2): 1 via ORAL
  Filled 2017-10-07 (×2): qty 1

## 2017-10-07 MED ORDER — AMMONIA AROMATIC IN INHA
RESPIRATORY_TRACT | Status: AC
  Filled 2017-10-07: qty 10

## 2017-10-07 MED ORDER — ONDANSETRON HCL 4 MG/2ML IJ SOLN
4.0000 mg | Freq: Four times a day (QID) | INTRAMUSCULAR | Status: DC | PRN
  Administered 2017-10-07: 4 mg via INTRAVENOUS
  Filled 2017-10-07: qty 2

## 2017-10-07 NOTE — Plan of Care (Signed)
  Progressing Education: Knowledge of General Education information will improve 10/07/2017 0324 - Progressing by Jarrett Soho, RN Health Behavior/Discharge Planning: Ability to manage health-related needs will improve 10/07/2017 0324 - Progressing by Jarrett Soho, RN Clinical Measurements: Ability to maintain clinical measurements within normal limits will improve 10/07/2017 0324 - Progressing by Jarrett Soho, RN Will remain free from infection 10/07/2017 0324 - Progressing by Jarrett Soho, RN Diagnostic test results will improve 10/07/2017 0324 - Progressing by Jarrett Soho, RN Respiratory complications will improve 10/07/2017 0324 - Progressing by Jarrett Soho, RN Cardiovascular complication will be avoided 10/07/2017 0324 - Progressing by Jarrett Soho, RN Activity: Risk for activity intolerance will decrease 10/07/2017 0324 - Progressing by Jarrett Soho, RN Nutrition: Adequate nutrition will be maintained 10/07/2017 0324 - Progressing by Jarrett Soho, RN Coping: Level of anxiety will decrease 10/07/2017 0324 - Progressing by Jarrett Soho, RN Elimination: Will not experience complications related to bowel motility 10/07/2017 0324 - Progressing by Jarrett Soho, RN Will not experience complications related to urinary retention 10/07/2017 0324 - Progressing by Jarrett Soho, RN Pain Managment: General experience of comfort will improve 10/07/2017 0324 - Progressing by Jarrett Soho, RN Safety: Ability to remain free from injury will improve 10/07/2017 0324 - Progressing by Jarrett Soho, RN Skin Integrity: Risk for impaired skin integrity will decrease 10/07/2017 0324 - Progressing by Jarrett Soho, RN Education: Knowledge of Childbirth will improve 10/07/2017 0324 - Progressing by Jarrett Soho, RN Ability to make informed decisions regarding treatment and plan of care will improve 10/07/2017 0324 - Progressing by  Jarrett Soho, RN Ability to state and carry out methods to decrease the pain will improve 10/07/2017 0324 - Progressing by Jarrett Soho, RN Coping: Ability to verbalize concerns and feelings about labor and delivery will improve 10/07/2017 0324 - Progressing by Jarrett Soho, RN Life Cycle: Ability to make normal progression through stages of labor will improve 10/07/2017 0324 - Progressing by Jarrett Soho, RN Ability to effectively push during vaginal delivery will improve 10/07/2017 0324 - Progressing by Jarrett Soho, RN Role Relationship: Ability to demonstrate positive interaction with the child will improve 10/07/2017 0324 - Progressing by Jarrett Soho, RN Safety: Risk of complications during labor and delivery will decrease 10/07/2017 0324 - Progressing by Jarrett Soho, RN Pain Management: Relief or control of pain from uterine contractions will improve 10/07/2017 0324 - Progressing by Jarrett Soho, RN

## 2017-10-07 NOTE — Anesthesia Procedure Notes (Signed)
Epidural Patient location during procedure: OB Start time: 10/07/2017 7:19 AM End time: 10/07/2017 7:39 AM  Staffing Anesthesiologist: Emmie Niemann, MD Performed: anesthesiologist   Preanesthetic Checklist Completed: patient identified, site marked, surgical consent, pre-op evaluation, timeout performed, IV checked, risks and benefits discussed and monitors and equipment checked  Epidural Patient position: sitting Prep: ChloraPrep Patient monitoring: heart rate, continuous pulse ox and blood pressure Approach: midline Location: L3-L4 Injection technique: LOR saline  Needle:  Needle type: Tuohy  Needle gauge: 18 G Needle length: 9 cm and 9 Needle insertion depth: 6 cm Catheter type: closed end flexible Catheter size: 20 Guage Catheter at skin depth: 10 cm Test dose: negative (0.125% bupivacaine)  Assessment Events: blood not aspirated, injection not painful, no injection resistance, negative IV test and no paresthesia  Additional Notes   Patient tolerated the insertion well without complications.Reason for block:procedure for pain

## 2017-10-07 NOTE — Progress Notes (Signed)
Patient ID: Kathleen Glass, female   DOB: November 20, 1995, 21 y.o.   MRN: [id number]  Kathleen Glass is a 21 y.o. G1P0 at [redacted]w[redacted]d by LMP admitted for active labor  Subjective:  Pt lying in bed. Denies relief of pain despite epidural placement. Family members at bedside.   Denies difficulty breathing or respiratory distress, chest pian, vaginal bleeding, dysuria, and leg pain or swelling.   Objective:  Temp:  [98.1 F (36.7 C)-98.7 F (37.1 C)] 98.7 F (37.1 C) (12/07 0647) Pulse Rate:  [63-118] 63 (12/07 0749) Resp:  [18] 18 (12/07 0318) BP: (119-139)/(68-83) 123/70 (12/07 0749) SpO2:  [97 %-100 %] 97 % (12/07 0740) Weight:  [158 lb (71.7 kg)-158 lb 3.2 oz (71.8 kg)] 158 lb (71.7 kg) (12/06 2255)  Fetal Wellbeing:  Category I   UC:   regular, every three (3) to five (5) minutes, soft resting tone  SVE:   Dilation: 5 Effacement (%): 100 Station: 0 Exam by:: Ranette Luckadoo   Labs:  Lab Results  Component Value Date   WBC 19.0 (H) 10/07/2017   HGB 11.2 (L) 10/07/2017   HCT 33.2 (L) 10/07/2017   MCV 77.9 (L) 10/07/2017   PLT 269 10/07/2017    Assessment:  Kathleen Glass is a 21 y.o. G1P0 at [redacted]w[redacted]d being admitted for labor, Rh positive, GBS positive  FHR Category I  Plan:  Anesthesia notified and on the way to assess epidural.   Start pitocin per order set.   Continue orders as written. Reassess as needed.    Diona Fanti, CNM 10/07/2017, 8:24 AM

## 2017-10-07 NOTE — H&P (Signed)
Obstetric History and Physical  Kathleen Glass is a 21 y.o. G1P0 with IUP at [redacted]w[redacted]d presenting with regular uterine contractions since 2100. Patient states she has been having  regular, every five (5) minutes contractions, none vaginal bleeding, intact membranes, with active fetal movement.    Denies difficulty breathing or respiratory distress, chest pain, dysuria, and leg pain or swelling.   Prenatal Course  Source of Care: [facility], initial visit at [redacted]w[redacted]d, 17 total visits  Pregnancy complications or risks: former smoker  Prenatal labs and studies:  ABO, Rh: --/--/PENDING 2023/11/07 0548)  Antibody: PENDING 11/07/2023 0548)  Rubella: 1.15 (04/24 1652)  Varicella: 711 (04/24 1652)  RPR: Non Reactive (04/24 1652)   HBsAg: Negative (04/24 1652)   HIV: Non Reactive (04/24 1652)  NWG:NFAOZHYQ (11/06 1129)  1 hr Glucola: 59 (09/07 0904)  Genetic screening: Low risk female (05/16 1150)  Anatomy US: Complete and normal (07/12 1330)  History reviewed. No pertinent past medical history.  Past Surgical History:  Procedure Laterality Date  . BREAST SURGERY Left 06-12-13   benign fibroadenoma    OB History  Gravida Para Term Preterm AB Living  1            SAB TAB Ectopic Multiple Live Births               # Outcome Date GA Lbr Len/2nd Weight Sex Delivery Anes PTL Lv  1 Current             Obstetric Comments  1st Menstrual Cycle: 12    Social History   Socioeconomic History  . Marital status: Single    Spouse name: None  . Number of children: None  . Years of education: None  . Highest education level: None  Social Needs  . Financial resource strain: None  . Food insecurity - worry: None  . Food insecurity - inability: None  . Transportation needs - medical: None  . Transportation needs - non-medical: None  Occupational History  . None  Tobacco Use  . Smoking status: Former Smoker    Packs/day: 0.25    Types: Cigarettes  . Smokeless tobacco: Never Used  Substance  and Sexual Activity  . Alcohol use: No  . Drug use: No  . Sexual activity: Yes    Birth control/protection: None  Other Topics Concern  . None  Social History Narrative  . None    Family History  Problem Relation Age of Onset  . Breast cancer Other   . Breast cancer Paternal Aunt        great aunt    Medications Prior to Admission  Medication Sig Dispense Refill Last Dose  . Prenatal Multivit-Min-Fe-FA (PRENATAL VITAMINS) 0.8 MG tablet Take 1 tablet by mouth daily. 30 tablet 0 10/03/2017 at Unknown time  . ranitidine (ZANTAC) 150 MG capsule Take 1 capsule (150 mg total) by mouth 2 (two) times daily. 60 capsule 1 10/02/2017 at Unknown time    No Known Allergies  Review of Systems: Negative except for what is mentioned in HPI.  Physical Exam:  Temp:  [98.1 F (36.7 C)-98.7 F (37.1 C)] 98.7 F (37.1 C) 07-Nov-2023 0647) Pulse Rate:  [70-118] 70 11/07/23 0647) Resp:  [18] 18 11-07-23 0318) BP: (119-139)/(78-81) 134/78 11/07/2023 0647) SpO2:  [99 %] 99 % 11/07/23 0650) Weight:  [158 lb (71.7 kg)-158 lb 3.2 oz (71.8 kg)] 158 lb (71.7 kg) (12/06 2255)  GENERAL: Well-developed, well-nourished female in no acute distress.   LUNGS: Clear to auscultation bilaterally.  HEART: Regular rate and rhythm.  ABDOMEN: Soft, nontender, nondistended, gravid.  EXTREMITIES: Nontender, no edema, 2+ distal pulses.  Cervical Exam: Dilation: 4.5 Effacement (%): 70 Cervical Position: Posterior Station: -1 Presentation: Vertex Exam by:: Gilberto Better, RN   AROM: meconium present, small amount  FHT:  Baseline rate 130 bpm   Variability moderate  Accelerations present   Decelerations none  Contractions: Every two (2) to seven (7) minutes, soft resting tone   Pertinent Labs/Studies:    Results for orders placed or performed during the hospital encounter of 10/06/17 (from the past 24 hour(s))  CBC     Status: Abnormal   Collection Time: 10/07/17  2:35 AM  Result Value Ref Range   WBC 19.0 (H) 3.6 -  11.0 K/uL   RBC 4.26 3.80 - 5.20 MIL/uL   Hemoglobin 11.2 (L) 12.0 - 16.0 g/dL   HCT 33.2 (L) 35.0 - 47.0 %   MCV 77.9 (L) 80.0 - 100.0 fL   MCH 26.2 26.0 - 34.0 pg   MCHC 33.7 32.0 - 36.0 g/dL   RDW 13.0 11.5 - 14.5 %   Platelets 269 150 - 440 K/uL  Type and screen [place]     Status: None (Preliminary result)   Collection Time: 10/07/17  2:35 AM  Result Value Ref Range   ABO/RH(D) PENDING    Antibody Screen PENDING    Sample Expiration 10/10/2017   [place] rt PCR ([place] only)     Status: None   Collection Time: 10/07/17  2:35 AM  Result Value Ref Range   Specimen source GC/Chlam URINE, RANDOM    Chlamydia Tr NOT DETECTED NOT DETECTED   N gonorrhoeae NOT DETECTED NOT DETECTED  Type and screen     Status: None (Preliminary result)   Collection Time: 10/07/17  5:48 AM  Result Value Ref Range   ABO/RH(D) PENDING    Antibody Screen PENDING    Sample Expiration 10/10/2017     Assessment :  Kathleen Glass is a 21 y.o. G1P0 at [redacted]w[redacted]d being admitted for labor, Rh positive, GBS positive  FHR Category I  Plan:  Labor: Expectant management.    Induction/Augmentation as needed, per protocol.  Continue GBS prophylaxis, see orders.   Delivery plan: Hopeful for vaginal delivery.   Diona Fanti, CNM [facility], [facility]

## 2017-10-07 NOTE — Anesthesia Procedure Notes (Signed)
Epidural Patient location during procedure: OB Start time: 10/07/2017 10:05 AM End time: 10/07/2017 10:15 AM  Staffing Anesthesiologist: Emmie Niemann, MD Performed: anesthesiologist   Preanesthetic Checklist Completed: patient identified, site marked, surgical consent, pre-op evaluation, timeout performed, IV checked, risks and benefits discussed and monitors and equipment checked  Epidural Patient position: sitting Prep: ChloraPrep Patient monitoring: heart rate, continuous pulse ox and blood pressure Approach: midline Location: L3-L4 Injection technique: LOR saline  Needle:  Needle type: Tuohy  Needle gauge: 18 G Needle length: 9 cm and 9 Needle insertion depth: 4 cm Catheter type: closed end flexible Catheter size: 20 Guage Catheter at skin depth: 8 cm Test dose: negative (0.125% bupivacaine)  Assessment Events: blood not aspirated, injection not painful, no injection resistance, negative IV test and no paresthesia  Additional Notes   Patient tolerated the insertion well without complications.Reason for block:procedure for pain

## 2017-10-07 NOTE — Anesthesia Preprocedure Evaluation (Signed)
Anesthesia Evaluation  Patient identified by MRN, date of birth, ID band Patient awake    Reviewed: Allergy & Precautions, NPO status , Patient's Chart, lab work & pertinent test results  History of Anesthesia Complications Negative for: history of anesthetic complications  Airway Mallampati: II  TM Distance: >3 FB Neck ROM: Full    Dental no notable dental hx.    Pulmonary neg sleep apnea, neg COPD, former smoker,    breath sounds clear to auscultation- rhonchi (-) wheezing      Cardiovascular Exercise Tolerance: Good (-) hypertension(-) CAD and (-) Past MI  Rhythm:Regular Rate:Normal - Systolic murmurs and - Diastolic murmurs    Neuro/Psych negative neurological ROS  negative psych ROS   GI/Hepatic negative GI ROS, Neg liver ROS,   Endo/Other  negative endocrine ROSneg diabetes  Renal/GU negative Renal ROS     Musculoskeletal negative musculoskeletal ROS (+)   Abdominal (+) - obese, Gravid abdomen  Peds  Hematology negative hematology ROS (+)   Anesthesia Other Findings    Reproductive/Obstetrics (+) Pregnancy                             Lab Results  Component Value Date   WBC 19.0 (H) 10/07/2017   HGB 11.2 (L) 10/07/2017   HCT 33.2 (L) 10/07/2017   MCV 77.9 (L) 10/07/2017   PLT 269 10/07/2017    Anesthesia Physical Anesthesia Plan  ASA: II  Anesthesia Plan: Epidural   Post-op Pain Management:    Induction:   PONV Risk Score and Plan: 2  Airway Management Planned:   Additional Equipment:   Intra-op Plan:   Post-operative Plan:   Informed Consent: I have reviewed the patients History and Physical, chart, labs and discussed the procedure including the risks, benefits and alternatives for the proposed anesthesia with the patient or authorized representative who has indicated his/her understanding and acceptance.     Plan Discussed with: CRNA and  Anesthesiologist  Anesthesia Plan Comments: (Plan for epidural for labor, discussed epidural vs spinal vs GA if need for csection)        Anesthesia Quick Evaluation

## 2017-10-07 NOTE — Progress Notes (Signed)
Patient ID: Kathleen Glass, female   DOB: [birth year], 21 y.o.   MRN: [id number]   Kathleen Glass is a 21 y.o. G1P0 at [redacted]w[redacted]d by LMP admitted for active labor  Subjective:  Pt resting quietly in bed, reports adequate pain relief since epidural was replaced at 1030. Family members at bedside.   Denies difficulty breathing or respiratory distress, chest pain, abdominal pain, vaginal bleeding, and leg pain or swelling.   Objective:  Temp:  [98.1 F (36.7 C)-98.7 F (37.1 C)] 98.1 F (36.7 C) (12/07 0845) Pulse Rate:  [63-118] 69 (12/07 1151) Resp:  [18] 18 (12/07 0318) BP: (116-139)/(68-83) 117/68 (12/07 1151) SpO2:  [96 %-100 %] 98 % (12/07 1150) Weight:  [158 lb (71.7 kg)-158 lb 3.2 oz (71.8 kg)] 158 lb (71.7 kg) (12/06 2255)  Fetal Wellbeing:  Category I   UC:   Two (2) to five (5) minutes, soft resting tone, pitocin infusing at 6 mu/min  SVE:   Dilation: 6 Effacement (%): 100 Station: +1 Exam by:: Sandrea Boer   Labs: Lab Results  Component Value Date   WBC 19.0 (H) 10/07/2017   HGB 11.2 (L) 10/07/2017   HCT 33.2 (L) 10/07/2017   MCV 77.9 (L) 10/07/2017   PLT 269 10/07/2017    Assessment:   Kathleen Glass a 21 y.o.G1P0 at [redacted]w[redacted]d being admitted for labor, Rh positive, GBS positive  FHR Category I  Plan:  Encourage frequent position change and use of peanut ball.   Reviewed red flag symptoms and when to call.   Continue orders as written. Reassess as needed.    Diona Fanti, CNM 10/07/2017, 12:16 PM

## 2017-10-08 LAB — CBC
HCT: 32.6 % — ABNORMAL LOW (ref 35.0–47.0)
Hemoglobin: 10.6 g/dL — ABNORMAL LOW (ref 12.0–16.0)
MCH: 25.8 pg — AB (ref 26.0–34.0)
MCHC: 32.4 g/dL (ref 32.0–36.0)
MCV: 79.4 fL — AB (ref 80.0–100.0)
PLATELETS: 241 10*3/uL (ref 150–440)
RBC: 4.11 MIL/uL (ref 3.80–5.20)
RDW: 13.3 % (ref 11.5–14.5)
WBC: 19.1 10*3/uL — AB (ref 3.6–11.0)

## 2017-10-08 LAB — RPR: RPR: NONREACTIVE

## 2017-10-08 MED ORDER — HYDROCORTISONE 1 % EX CREA
TOPICAL_CREAM | Freq: Four times a day (QID) | CUTANEOUS | Status: DC
  Administered 2017-10-08 – 2017-10-09 (×5): via TOPICAL
  Filled 2017-10-08 (×2): qty 28

## 2017-10-08 MED ORDER — FERROUS SULFATE 325 (65 FE) MG PO TABS
325.0000 mg | ORAL_TABLET | Freq: Every day | ORAL | Status: DC
  Administered 2017-10-08 – 2017-10-09 (×2): 325 mg via ORAL
  Filled 2017-10-08 (×2): qty 1

## 2017-10-08 NOTE — Anesthesia Postprocedure Evaluation (Signed)
Anesthesia Post Note  Patient: Kathleen Glass  Procedure(s) Performed: AN AD [place]  Patient location during evaluation: Mother Baby Anesthesia Type: Epidural Level of consciousness: awake and alert Pain management: pain level controlled Vital Signs Assessment: post-procedure vital signs reviewed and stable Respiratory status: spontaneous breathing, nonlabored ventilation and respiratory function stable Cardiovascular status: stable Postop Assessment: no headache and no backache Anesthetic complications: no     Last Vitals:  Vitals:   10/08/17 0751 10/08/17 1645  BP: 113/71 113/68  Pulse: 68 70  Resp:    Temp: 36.8 C 36.7 C  SpO2: 98% 98%    Last Pain:  Vitals:   10/08/17 1645  TempSrc: Oral  PainSc:                  Martha Clan

## 2017-10-08 NOTE — Progress Notes (Signed)
Called at 1711 and advised by nurse that [place] wanted me to evaluate the fetal monitor strip. She said that [place] CNM was "considering" doing a vacuum delivery and wanted me to log on to evaluate the monitor strip. I told her I would just come in to evaluate the strip and I would be there in the event [facility] CNM decided that a vacuum was needed. After arrival I waited at the nurses station evaluating the strip and waiting for [place] to decide if she wanted a consult. When the fetal heart rate became bradycardic, I looked into the room and saw that the head was delivering and that I was not needed for intervention.

## 2017-10-08 NOTE — Progress Notes (Signed)
Patient ID: Kathleen Glass, female   DOB: 1996-09-03, 21 y.o.   MRN: [id number]  Post Partum Day # 1, s/p SVD  Subjective:  Pt doing well, reports intermittent back pain since epidural placement. Eating, drinking, and voiding without difficulty. Infant in special care nursery. Family member at bedside.   Denies difficulty breathing or respiratory distress, chest pain, abdominal pain, excessive vaginal bleeding, dysuria, and leg pain or swelling.   Objective:  Temp:  [98 F (36.7 C)-99.5 F (37.5 C)] 98.3 F (36.8 C) (12/08 0751) Pulse Rate:  [67-103] 68 (12/08 0751) Resp:  [18-20] 18 (12/08 0434) BP: (112-149)/(60-96) 113/71 (12/08 0751) SpO2:  [97 %-100 %] 98 % (12/08 0751)  Physical Exam:   General: alert and cooperative   Lungs: clear to auscultation bilaterally  Breasts: normal appearance, no masses or tenderness  Heart: regular rate and rhythm, S1, S2 normal, no murmur, click, rub or gallop  Abdomen: soft, non-tender; bowel sounds normal; no masses,  no organomegaly  Pelvis: Lochia: appropriate, Uterine  Fundus: firm  Extremities: No evidence of DVT seen on physical exam. Negative Homan's sign.  Recent Labs    10/07/17 0235 10/08/17 0713  HGB 11.2* 10.6*  HCT 33.2* 32.6*    Assessment:  Status post vaginal birth  Postpartum anemia  Breastfeeding/Pumping   Plan:  Reassess as needed.   Reviewed red flag symptoms and when to call.   Plan for discharge tomorrow.  LOS: 1 day    Diona Fanti, CNM [facility] 10/08/2017 8:33 AM

## 2017-10-09 MED ORDER — IBUPROFEN 600 MG PO TABS: 600.0000 mg | ORAL_TABLET | Freq: Four times a day (QID) | ORAL | 0 refills | Status: DC

## 2017-10-09 MED ORDER — FERROUS SULFATE 325 (65 FE) MG PO TABS: 325.0000 mg | ORAL_TABLET | Freq: Every day | ORAL | 3 refills | Status: DC

## 2017-10-09 NOTE — Discharge Instructions (Signed)
Breast Pumping Tips °If you are breastfeeding, there may be times when you cannot feed your baby directly. Returning to work or going on a trip are common examples. Pumping allows you to store breast milk and feed it to your baby later. °You may not get much milk when you first start to pump. Your breasts should start to make more after a few days. If you pump at the times you usually feed your baby, you may be able to keep making enough milk to feed your baby without also using formula. The more often you pump, the more milk you will produce. °When should I pump? °· You can begin to pump soon after delivery. However, some experts recommend waiting about 4 weeks before giving your infant a bottle to make sure breastfeeding is going well. °· If you plan to return to work, begin pumping a few weeks before. This will help you develop techniques that work best for you. It also lets you build up a supply of breast milk. °· When you are with your infant, feed on demand and pump after each feeding. °· When you are away from your infant for several hours, pump for about 15 minutes every 2-3 hours. Pump both breasts at the same time if you can. °· If your infant has a formula feeding, make sure to pump around the same time. °· If you drink any alcohol, wait 2 hours before pumping. °How do I prepare to pump? °Your let-down reflex is the natural reaction to stimulation that makes your breast milk flow. It is easier to stimulate this reflex when you are relaxed. Find relaxation techniques that work for you. If you have difficulty with your let-down reflex, try these methods: °· Smell one of your infant's blankets or an item of clothing. °· Look at a picture or video of your infant. °· Sit in a quiet, private space. °· Massage the breast you plan to pump. °· Place soothing warmth on the breast. °· Play relaxing music. ° °What are some general breast pumping tips? °· Wash your hands before you pump. You do not need to wash your  nipples or breasts. °· There are three ways to pump. °? You can use your hand to massage and compress your breast. °? You can use a handheld manual pump. °? You can use an electric pump. °· Make sure the suction cup (flange) on the breast pump is the right size. Place the flange directly over the nipple. If it is the wrong size or placed the wrong way, it may be painful and cause nipple damage. °· If pumping is uncomfortable, apply a small amount of purified or modified lanolin to your nipple and areola. °· If you are using an electric pump, adjust the speed and suction power to be more comfortable. °· If pumping is painful or if you are not getting very much milk, you may need a different type of pump. A lactation consultant can help you determine what type of pump to use. °· Keep a full water bottle near you at all times. Drinking lots of fluid helps you make more milk. °· You can store your milk to use later. Pumped breast milk can be stored in a sealable, sterile container or plastic bag. Label all stored breast milk with the date you pumped it. °? Milk can stay out at room temperature for up to 8 hours. °? You can store your milk in the refrigerator for up to 8 days. °? You can   store your milk in the freezer for 3 months. Thaw frozen milk using warm water. Do not put it in the microwave.  Do not smoke. Smoking can lower your milk supply and harm your infant. If you need help quitting, ask your health care provider to recommend a program. When should I call my health care provider or a lactation consultant?  You are having trouble pumping.  You are concerned that you are not making enough milk.  You have nipple pain, soreness, or redness.  You want to use birth control. Birth control pills may lower your milk supply. Talk to your health care provider about your options. This information is not intended to replace advice given to you by your health care provider. Make sure you discuss any questions  you have with your health care provider. Document Released: 04/07/2010 Document Revised: 03/31/2016 Document Reviewed: 08/10/2013 Elsevier Interactive Patient Education  2017 [employer]. Postpartum Care After Vaginal Delivery The period of time right after you deliver your newborn is called the postpartum period. What kind of medical care will I receive?  You may continue to receive fluids and medicines through an IV tube inserted into one of your veins.  If an incision was made near your vagina (episiotomy) or if you had some vaginal tearing during delivery, cold compresses may be placed on your episiotomy or your tear. This helps to reduce pain and swelling.  You may be given a squirt bottle to use when you go to the bathroom. You may use this until you are comfortable wiping as usual. To use the squirt bottle, follow these steps: ? Before you urinate, fill the squirt bottle with warm water. Do not use hot water. ? After you urinate, while you are sitting on the toilet, use the squirt bottle to rinse the area around your urethra and vaginal opening. This rinses away any urine and blood. ? You may do this instead of wiping. As you start healing, you may use the squirt bottle before wiping yourself. Make sure to wipe gently. ? Fill the squirt bottle with clean water every time you use the bathroom.  You will be given sanitary pads to wear. How can I expect to feel?  You may not feel the need to urinate for several hours after delivery.  You will have some soreness and pain in your abdomen and vagina.  If you are breastfeeding, you may have uterine contractions every time you breastfeed for up to several weeks postpartum. Uterine contractions help your uterus return to its normal size.  It is normal to have vaginal bleeding (lochia) after delivery. The amount and appearance of lochia is often similar to a menstrual period in the first week after delivery. It will gradually decrease over  the next few weeks to a dry, yellow-brown discharge. For most women, lochia stops completely by 6-8 weeks after delivery. Vaginal bleeding can vary from woman to woman.  Within the first few days after delivery, you may have breast engorgement. This is when your breasts feel heavy, full, and uncomfortable. Your breasts may also throb and feel hard, tightly stretched, warm, and tender. After this occurs, you may have milk leaking from your breasts.Your health care provider can help you relieve discomfort due to breast engorgement. Breast engorgement should go away within a few days.  You may feel more sad or worried than normal due to hormonal changes after delivery. These feelings should not last more than a few days. If these feelings do not  go away after several days, speak with your health care provider. How should I care for myself?  Tell your health care provider if you have pain or discomfort.  Drink enough water to keep your urine clear or pale yellow.  Wash your hands thoroughly with soap and water for at least 20 seconds after changing your sanitary pads, after using the toilet, and before holding or feeding your baby.  If you are not breastfeeding, avoid touching your breasts a lot. Doing this can make your breasts produce more milk.  If you become weak or lightheaded, or you feel like you might faint, ask for help before: ? Getting out of bed. ? Showering.  Change your sanitary pads frequently. Watch for any changes in your flow, such as a sudden increase in volume, a change in color, the passing of large blood clots. If you pass a blood clot from your vagina, save it to show to your health care provider. Do not flush blood clots down the toilet without having your health care provider look at them.  Make sure that all your vaccinations are up to date. This can help protect you and your baby from getting certain diseases. You may need to have immunizations done before you leave the  hospital.  If desired, talk with your health care provider about methods of family planning or birth control (contraception). How can I start bonding with my baby? Spending as much time as possible with your baby is very important. During this time, you and your baby can get to know each other and develop a bond. Having your baby stay with you in your room (rooming in) can give you time to get to know your baby. Rooming in can also help you become comfortable caring for your baby. Breastfeeding can also help you bond with your baby. How can I plan for returning home with my baby?  Make sure that you have a car seat installed in your vehicle. ? Your car seat should be checked by a certified car seat installer to make sure that it is installed safely. ? Make sure that your baby fits into the car seat safely.  Ask your health care provider any questions you have about caring for yourself or your baby. Make sure that you are able to contact your health care provider with any questions after leaving the hospital. This information is not intended to replace advice given to you by your health care provider. Make sure you discuss any questions you have with your health care provider. Document Released: 08/15/2007 Document Revised: 03/22/2016 Document Reviewed: 09/22/2015 Elsevier Interactive Patient Education  2018 [employer]. Postpartum Depression and Baby Blues The postpartum period begins right after the birth of a baby. During this time, there is often a great amount of joy and excitement. It is also a time of many changes in the life of the parents. Regardless of how many times a mother gives birth, each child brings new challenges and dynamics to the family. It is not unusual to have feelings of excitement along with confusing shifts in moods, emotions, and thoughts. All mothers are at risk of developing postpartum depression or the "baby blues." These mood changes can occur right after giving  birth, or they may occur many months after giving birth. The baby blues or postpartum depression can be mild or severe. Additionally, postpartum depression can go away rather quickly, or it can be a long-term condition. What are the causes? Raised hormone levels and the  rapid drop in those levels are thought to be a main cause of postpartum depression and the baby blues. A number of hormones change during and after pregnancy. Estrogen and progesterone usually decrease right after the delivery of your baby. The levels of thyroid hormone and various cortisol steroids also rapidly drop. Other factors that play a role in these mood changes include major life events and genetics. What increases the risk? If you have any of the following risks for the baby blues or postpartum depression, know what symptoms to watch out for during the postpartum period. Risk factors that may increase the likelihood of getting the baby blues or postpartum depression include:  Having a personal or family history of depression.  Having depression while being pregnant.  Having premenstrual mood issues or mood issues related to oral contraceptives.  Having a lot of life stress.  Having marital conflict.  Lacking a social support network.  Having a baby with special needs.  Having health problems, such as diabetes.  What are the signs or symptoms? Symptoms of baby blues include:  Brief changes in mood, such as going from extreme happiness to sadness.  Decreased concentration.  Difficulty sleeping.  Crying spells, tearfulness.  Irritability.  Anxiety.  Symptoms of postpartum depression typically begin within the first month after giving birth. These symptoms include:  Difficulty sleeping or excessive sleepiness.  Marked weight loss.  Agitation.  Feelings of worthlessness.  Lack of interest in activity or food.  Postpartum psychosis is a very serious condition and can be dangerous. Fortunately, it  is rare. Displaying any of the following symptoms is cause for immediate medical attention. Symptoms of postpartum psychosis include:  Hallucinations and delusions.  Bizarre or disorganized behavior.  Confusion or disorientation.  How is this diagnosed? A diagnosis is made by an evaluation of your symptoms. There are no medical or lab tests that lead to a diagnosis, but there are various questionnaires that a health care provider may use to identify those with the baby blues, postpartum depression, or psychosis. Often, a screening tool called the [place] Postnatal Depression Scale is used to diagnose depression in the postpartum period. How is this treated? The baby blues usually goes away on its own in 1-2 weeks. Social support is often all that is needed. You will be encouraged to get adequate sleep and rest. Occasionally, you may be given medicines to help you sleep. Postpartum depression requires treatment because it can last several months or longer if it is not treated. Treatment may include individual or group therapy, medicine, or both to address any social, physiological, and psychological factors that may play a role in the depression. Regular exercise, a healthy diet, rest, and social support may also be strongly recommended. Postpartum psychosis is more serious and needs treatment right away. Hospitalization is often needed. Follow these instructions at home:  Get as much rest as you can. Nap when the baby sleeps.  Exercise regularly. Some women find yoga and walking to be beneficial.  Eat a balanced and nourishing diet.  Do little things that you enjoy. Have a cup of tea, take a bubble bath, read your favorite magazine, or listen to your favorite music.  Avoid alcohol.  Ask for help with household chores, cooking, grocery shopping, or running errands as needed. Do not try to do everything.  Talk to people close to you about how you are feeling. Get support from your  partner, family members, friends, or other new moms.  Try to stay positive  in how you think. Think about the things you are grateful for.  Do not spend a lot of time alone.  Only take over-the-counter or prescription medicine as directed by your health care provider.  Keep all your postpartum appointments.  Let your health care provider know if you have any concerns. Contact a health care provider if: You are having a reaction to or problems with your medicine. Get help right away if:  You have suicidal feelings.  You think you may harm the baby or someone else. This information is not intended to replace advice given to you by your health care provider. Make sure you discuss any questions you have with your health care provider. Document Released: 07/22/2004 Document Revised: 03/25/2016 Document Reviewed: 07/30/2013 Elsevier Interactive Patient Education  2017 [employer].

## 2017-10-09 NOTE — Discharge Summary (Signed)
Obstetric Discharge Summary  Patient ID: Kathleen Glass MRN: [id number] DOB/AGE: [birth year] 21 y.o.   Date of Admission: 10/06/2017 Dani Gobble, CNM Waynetta Pean, MD)  Date of Discharge: Dani Gobble, CNM Waynetta Pean, MD)  Admitting Diagnosis: Onset of Labor at [redacted]w[redacted]d  Secondary Diagnosis: Postdates pregnancy  Mode of Delivery: Normal spontaneous vaginal birth     Discharge Diagnosis: Postpartum anemia   Intrapartum Procedures: Atificial rupture of membranes, epidural, GBS prophylaxis and pitocin augmentation   Post partum procedures: None  Complications: Bilateral labial lacerations   Brief Hospital Course   Kathleen Glass is a [id number] who had a SVD on 10/07/2017;  for further details of this surgery, please refer to the delivey note.  Patient had an uncomplicated postpartum course.  By time of discharge on PPD#2, her pain was controlled on oral pain medications; she had appropriate lochia and was ambulating, voiding without difficulty and tolerating regular diet.  She was deemed stable for discharge to home.    Labs:  CBC Latest Ref Rng & Units 10/08/2017 10/07/2017 10/03/2017  WBC 3.6 - 11.0 K/uL 19.1(H) 19.0(H) 11.9(H)  Hemoglobin 12.0 - 16.0 g/dL 10.6(L) 11.2(L) 11.5(L)  Hematocrit 35.0 - 47.0 % 32.6(L) 33.2(L) 35.0  Platelets 150 - 440 K/uL 241 269 250   A POS  Physical exam:   Temp:  [98.1 F (36.7 C)-98.4 F (36.9 C)] 98.3 F (36.8 C) (12/09 0826) Pulse Rate:  [64-82] 82 (12/09 0826) Resp:  [18] 18 (12/09 0826) BP: (113-140)/(68-80) 140/80 (12/09 0826) SpO2:  [97 %-98 %] 98 % (12/09 0826)  General: alert and no distress  Cardiac: normal rate, regular rhythm, normal S1, S2, no murmurs, rubs, clicks or gallops.  Respiratory: Normal respiratory effort, chest expands symmetrically. Lungs are clear to auscultation, no crackles or wheezes.  Lochia: appropriate  Abdomen: soft, NT  Uterine Fundus: firm  Perinuem: healing well, no significant drainage, no  dehiscence, no significant erythema  Extremities: No evidence of DVT seen on physical exam. No lower extremity edema.  Discharge Instructions: Per After Visit Summary.  Activity: Advance as tolerated. Pelvic rest for 6 weeks.  Also refer to After Visit Summary  Diet: Regular  Medications:  Allergies as of 10/09/2017   No Known Allergies     Medication List    TAKE these medications   ferrous sulfate 325 (65 FE) MG tablet Take 1 tablet (325 mg total) by mouth daily with breakfast.   ibuprofen 600 MG tablet Commonly known as:  ADVIL,MOTRIN Take 1 tablet (600 mg total) by mouth every 6 (six) hours.   Prenatal Vitamins 0.8 MG tablet Take 1 tablet by mouth daily.   ranitidine 150 MG capsule Commonly known as:  ZANTAC Take 1 capsule (150 mg total) by mouth 2 (two) times daily.      Outpatient follow up:  Follow-up Information    Diona Fanti, CNM. Schedule an appointment as soon as possible for a visit in 6 week(s).   Specialties:  Certified Nurse Midwife, Obstetrics and Gynecology, Radiology Why:  Please schedule 6 wk PPV Contact information: [address] [place] [address] [phone]          Postpartum contraception: Nexplanon  Discharged Condition: stable  Discharged to: home   Newborn Data:  Disposition:NICU  Apgars: APGAR (1 MIN): 7   APGAR (5 MINS): 8    Baby Feeding: Breast and Formula   Diona Fanti, CNM [facility], [facility]
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
## 2017-10-09 NOTE — Progress Notes (Signed)
Patient changed mind and has decided to go home instead of boarding in room. Wheelchair offered for discharge but patient requested to walk. Patient walked to car for discharge at 1715.

## 2017-10-10 ENCOUNTER — Other Ambulatory Visit: Payer: Medicaid Other

## 2017-10-10 ENCOUNTER — Ambulatory Visit: Payer: Self-pay

## 2017-10-10 ENCOUNTER — Encounter: Payer: Medicaid Other | Admitting: Certified Nurse Midwife

## 2017-10-10 NOTE — Lactation Note (Signed)
This note was copied from a baby's chart. Lactation Consultation Note  Patient Name: Kathleen Glass [id number] Date: 10/10/2017   Mom came to visit baby today and had not pumped or hand expressed since discharge from the hospital yesterday.  Mom's breasts were hard, painful and warm to touch.  Mom held baby for short period.  Assisted mom with breast massage and hand expression before starting Symphony pump and continued massaging while pumping.  Mom pumped over 100 ml and breasts were softer, left more than right.  Loaned Lactina pump through [employer] until can get DEBP from [facility].  Mom did not stay long, because father of baby wanted to leave.  Stressed importance of pumping every 3 hours massaging if needed to relieve fullness in breasts.  Mom agreed.  Mom left feeling much better with pump, kit, bottles and labels.  Maternal Data    Feeding Feeding Type: Formula Length of feed: 30 min  LATCH Score                   Interventions    Lactation Tools Discussed/Used     Consult Status      Jarold Motto 10/10/2017, 9:57 PM

## 2017-11-04 ENCOUNTER — Ambulatory Visit (INDEPENDENT_AMBULATORY_CARE_PROVIDER_SITE_OTHER): Payer: Medicaid Other | Admitting: Certified Nurse Midwife

## 2017-11-04 ENCOUNTER — Encounter: Payer: Self-pay | Admitting: Certified Nurse Midwife

## 2017-11-04 DIAGNOSIS — Z30017 Encounter for initial prescription of implantable subdermal contraceptive: Secondary | ICD-10-CM | POA: Diagnosis not present

## 2017-11-04 MED ORDER — ETONOGESTREL 68 MG ~~LOC~~ IMPL
68.0000 mg | DRUG_IMPLANT | Freq: Once | SUBCUTANEOUS | Status: AC
  Administered 2017-11-04: 68 mg via SUBCUTANEOUS

## 2017-11-04 NOTE — Progress Notes (Signed)
Subjective:    Jazzmon Prindle is a 22 y.o. G56P1001 Caucasian female who presents for a postpartum visit. She is 4 weeks postpartum following a spontaneous vaginal delivery at 40+4 gestational weeks. Anesthesia: epidural. I have fully reviewed the prenatal and intrapartum course.   Postpartum course has been uncomplicated. Baby's course has been complicated by NICU stay, released to home on 10/16/2017. Baby is feeding by formula. Bleeding no bleeding. Bowel function is normal. Bladder function is normal.   Patient is not sexually active. Last sexual activity: prior to birth of infant. Contraception method is abstinence. Postpartum depression screening: negative. Score 3.  Last pap: N/A.  The following portions of the patient's history were reviewed and updated as appropriate: allergies, current medications, past medical history, past surgical history and problem list.  Review of Systems  Pertinent items are noted in HPI.   Objective:   BP 123/81   Pulse 66   Ht 5\' 2"  (1.575 m)   Wt 136 lb 14.4 oz (62.1 kg)   Breastfeeding? No   BMI 25.04 kg/m   General:  alert, cooperative and no distress   Breasts:  deferred, no complaints  Lungs: clear to auscultation bilaterally  Heart:  regular rate and rhythm  Abdomen: soft, nontender   Vulva: normal  Vagina: normal vagina  Cervix:  closed  Corpus: Well-involuted  Adnexa:  Non-palpable      Depression screen PHQ 2/9 11/04/2017  Decreased Interest 3  Down, Depressed, Hopeless 0  PHQ - 2 Score 3  Altered sleeping 0  Tired, decreased energy 0  Change in appetite 0  Feeling bad or failure about yourself  0  Trouble concentrating 0  Moving slowly or fidgety/restless 0  Suicidal thoughts 0  PHQ-9 Score 3      Assessment:   Postpartum exam Four (4) wks s/p spontaneous vaginal birth Formula feeding Depression screening Contraception counseling   Plan:   Return to work note given, see communication tab.   Desires Nexplanon for  contraception, see procedure note below.   Reviewed red flag symptoms and when to call.   Follow up in: 9 months for Annual Exam or earlier if needed.   Diona Fanti, CNM [facility], [facility]   Nexplanon Insertion Note:  Cathalina Barcia is a 22 y.o. year old Caucasian female here for Nexplanon insertion.  No LMP recorded., last sexual intercourse was prior to birth of infant (10/07/2017).  Risks/benefits/side effects of Nexplanon have been discussed and her questions have been answered.  Specifically, a failure rate of 11/998 has been reported, with an increased failure rate if pt takes [place] and/or antiseizure medicaitons.   Lateia Fraser is aware of the common side effect of irregular bleeding, which the incidence of decreases over time.  BP 123/81   Pulse 66   Ht 5\' 2"  (1.575 m)   Wt 136 lb 14.4 oz (62.1 kg)   Breastfeeding? No   BMI 25.04 kg/m   She is right-handed, so her left arm, approximately 4 inches proximal from the elbow, was cleansed with alcohol and anesthetized with 2cc of 2% Lidocaine.  The area was cleansed again with betadine and the Nexplanon was inserted per manufacturer's recommendations without difficulty.  A steri-strip and pressure bandage were applied.  Pt was instructed to keep the area clean and dry, remove pressure bandage in 24 hours, and keep insertion site covered with the steri-strip for 3-5 days.  Back up contraception was recommended for 2 weeks.  She was given  a card indicating date Nexplanon was inserted and date it needs to be removed.   Reviewed red flag symptoms and when to call.   Follow up as needed.    Lara Mulch Lawhorn,CNM [facility]

## 2017-11-04 NOTE — Patient Instructions (Addendum)
Nexplanon Instructions After Insertion   Keep bandage clean and dry for 24 hours   May use ice/Tylenol/Ibuprofen for soreness or pain   If you develop fever, drainage or increased warmth from incision site-contact office immediately  Etonogestrel implant What is this medicine? ETONOGESTREL (et oh noe JES trel) is a contraceptive (birth control) device. It is used to prevent pregnancy. It can be used for up to 3 years. This medicine may be used for other purposes; ask your health care provider or pharmacist if you have questions. COMMON BRAND NAME(S): Implanon, Nexplanon What should I tell my health care provider before I take this medicine? They need to know if you have any of these conditions: -abnormal vaginal bleeding -blood vessel disease or blood clots -cancer of the breast, cervix, or liver -depression -diabetes -gallbladder disease -headaches -heart disease or recent heart attack -high blood pressure -high cholesterol -kidney disease -liver disease -renal disease -seizures -tobacco smoker -an unusual or allergic reaction to etonogestrel, other hormones, anesthetics or antiseptics, medicines, foods, dyes, or preservatives -pregnant or trying to get pregnant -breast-feeding How should I use this medicine? This device is inserted just under the skin on the inner side of your upper arm by a health care professional. Talk to your pediatrician regarding the use of this medicine in children. Special care may be needed. Overdosage: If you think you have taken too much of this medicine contact a poison control center or emergency room at once. NOTE: This medicine is only for you. Do not share this medicine with others. What if I miss a dose? This does not apply. What may interact with this medicine? Do not take this medicine with any of the following medications: -amprenavir -bosentan -fosamprenavir This medicine may also interact with the following  medications: -barbiturate medicines for inducing sleep or treating seizures -certain medicines for fungal infections like ketoconazole and itraconazole -grapefruit juice -griseofulvin -medicines to treat seizures like carbamazepine, felbamate, oxcarbazepine, phenytoin, topiramate -modafinil -phenylbutazone -rifampin -rufinamide -some medicines to treat HIV infection like atazanavir, indinavir, lopinavir, nelfinavir, tipranavir, ritonavir -St. John's wort This list may not describe all possible interactions. Give your health care provider a list of all the medicines, herbs, non-prescription drugs, or dietary supplements you use. Also tell them if you smoke, drink alcohol, or use illegal drugs. Some items may interact with your medicine. What should I watch for while using this medicine? This product does not protect you against HIV infection (AIDS) or other sexually transmitted diseases. You should be able to feel the implant by pressing your fingertips over the skin where it was inserted. Contact your doctor if you cannot feel the implant, and use a non-hormonal birth control method (such as condoms) until your doctor confirms that the implant is in place. If you feel that the implant may have broken or become bent while in your arm, contact your healthcare provider. What side effects may I notice from receiving this medicine? Side effects that you should report to your doctor or health care professional as soon as possible: -allergic reactions like skin rash, itching or hives, swelling of the face, lips, or tongue -breast lumps -changes in emotions or moods -depressed mood -heavy or prolonged menstrual bleeding -pain, irritation, swelling, or bruising at the insertion site -scar at site of insertion -signs of infection at the insertion site such as fever, and skin redness, pain or discharge -signs of pregnancy -signs and symptoms of a blood clot such as breathing problems; changes in  vision; chest pain; severe,  sudden headache; pain, swelling, warmth in the leg; trouble speaking; sudden numbness or weakness of the face, arm or leg -signs and symptoms of liver injury like dark yellow or brown urine; general ill feeling or flu-like symptoms; light-colored stools; loss of appetite; nausea; right upper belly pain; unusually weak or tired; yellowing of the eyes or skin -unusual vaginal bleeding, discharge -signs and symptoms of a stroke like changes in vision; confusion; trouble speaking or understanding; severe headaches; sudden numbness or weakness of the face, arm or leg; trouble walking; dizziness; loss of balance or coordination Side effects that usually do not require medical attention (report to your doctor or health care professional if they continue or are bothersome): -acne -back pain -breast pain -changes in weight -dizziness -general ill feeling or flu-like symptoms -headache -irregular menstrual bleeding -nausea -sore throat -vaginal irritation or inflammation This list may not describe all possible side effects. Call your doctor for medical advice about side effects. You may report side effects to FDA at 1-800-FDA-1088. Where should I keep my medicine? This drug is given in a hospital or clinic and will not be stored at home. NOTE: This sheet is a summary. It may not cover all possible information. If you have questions about this medicine, talk to your doctor, pharmacist, or health care provider.  2018 Elsevier/Gold Standard (2016-05-06 11:19:22)  Preventive Care 18-39 Years, Female Preventive care refers to lifestyle choices and visits with your health care provider that can promote health and wellness. What does preventive care include?  A yearly physical exam. This is also called an annual well check.  Dental exams once or twice a year.  Routine eye exams. Ask your health care provider how often you should have your eyes checked.  Personal lifestyle  choices, including: ? Daily care of your teeth and gums. ? Regular physical activity. ? Eating a healthy diet. ? Avoiding tobacco and drug use. ? Limiting alcohol use. ? Practicing safe sex. ? Taking vitamin and mineral supplements as recommended by your health care provider. What happens during an annual well check? The services and screenings done by your health care provider during your annual well check will depend on your age, overall health, lifestyle risk factors, and family history of disease. Counseling Your health care provider may ask you questions about your:  Alcohol use.  Tobacco use.  Drug use.  Emotional well-being.  Home and relationship well-being.  Sexual activity.  Eating habits.  Work and work Statistician.  Method of birth control.  Menstrual cycle.  Pregnancy history.  Screening You may have the following tests or measurements:  Height, weight, and BMI.  Diabetes screening. This is done by checking your blood sugar (glucose) after you have not eaten for a while (fasting).  Blood pressure.  Lipid and cholesterol levels. These may be checked every 5 years starting at age 54.  Skin check.  Hepatitis C blood test.  Hepatitis B blood test.  Sexually transmitted disease (STD) testing.  BRCA-related cancer screening. This may be done if you have a family history of breast, ovarian, tubal, or peritoneal cancers.  Pelvic exam and Pap test. This may be done every 3 years starting at age 16. Starting at age 86, this may be done every 5 years if you have a Pap test in combination with an HPV test.  Discuss your test results, treatment options, and if necessary, the need for more tests with your health care provider. Vaccines Your health care provider may recommend certain vaccines, such  as:  Influenza vaccine. This is recommended every year.  Tetanus, diphtheria, and acellular pertussis (Tdap, Td) vaccine. You may need a Td booster every 10  years.  Varicella vaccine. You may need this if you have not been vaccinated.  HPV vaccine. If you are 7 or younger, you may need three doses over 6 months.  Measles, mumps, and rubella (MMR) vaccine. You may need at least one dose of MMR. You may also need a second dose.  Pneumococcal 13-valent conjugate (PCV13) vaccine. You may need this if you have certain conditions and were not previously vaccinated.  Pneumococcal polysaccharide (PPSV23) vaccine. You may need one or two doses if you smoke cigarettes or if you have certain conditions.  Meningococcal vaccine. One dose is recommended if you are age 8-21 years and a first-year college student living in a residence hall, or if you have one of several medical conditions. You may also need additional booster doses.  Hepatitis A vaccine. You may need this if you have certain conditions or if you travel or work in places where you may be exposed to hepatitis A.  Hepatitis B vaccine. You may need this if you have certain conditions or if you travel or work in places where you may be exposed to hepatitis B.  Haemophilus influenzae type b (Hib) vaccine. You may need this if you have certain risk factors.  Talk to your health care provider about which screenings and vaccines you need and how often you need them. This information is not intended to replace advice given to you by your health care provider. Make sure you discuss any questions you have with your health care provider. Document Released: 12/14/2001 Document Revised: 07/07/2016 Document Reviewed: 08/19/2015 Elsevier Interactive Patient Education  [employer].

## 2017-11-12 NOTE — Discharge Summary (Signed)
Obstetric Discharge Summary  Patient ID: Kathleen Glass MRN: [id number] DOB/AGE: 1996/10/15 22 y.o.   Date of Admission: 10/03/2017 Annia Friendly Waynetta Pean, MD)  Date of Discharge: 10/03/2017 Dani Gobble, CNM Dellia Nims, MD)  Admitting Diagnosis: Observation at [redacted]w[redacted]d  Secondary Diagnosis: Headache in pregnancy     Discharge Diagnosis: No other diagnosis   Antepartum Procedures: NST  Brief Hospital Course   L&D OB Triage Note  Kathleen Glass is a 22 y.o. G59P1001 female at [redacted]w[redacted]d, EDD Estimated Date of Delivery: 10/03/17 who presented to triage for complaints of headache and uterine contractions.  She was evaluated by the nurses with no significant findings for active labor, preeclampsia, or fetal distress. Vital signs stable. An NST was performed and has been reviewed by CNM. She was treated with oral hydration and percocet.  NST INTERPRETATION: Indications: rule out uterine contractions  Mode: External Baseline Rate (A): 125 bpm Variability: Moderate Accelerations: 15 x 15 Decelerations: None     Contraction Frequency (min): irreg  Impression: reactive   Plan: NST performed was reviewed and was found to be reactive. She was discharged home with bleeding/labor precautions. Follow up with CNM as previously scheduled for BPP and ROB on Thursday or sooner if needed.  Discharge Instructions: Per After Visit Summary.  Activity: Refer to After Visit Summary.  Diet: Regular  Medications:  Allergies as of 10/03/2017   No Known Allergies     Medication List    ASK your doctor about these medications   Prenatal Vitamins 0.8 MG tablet Take 1 tablet by mouth daily.   ranitidine 150 MG capsule Commonly known as:  ZANTAC Take 1 capsule (150 mg total) by mouth 2 (two) times daily.      Outpatient follow up:    Postpartum contraception: Nexplanon  Discharged Condition: stable  Discharged to: home   Diona Fanti, CNM [facility],  [facility]

## 2017-12-27 ENCOUNTER — Telehealth: Payer: Self-pay | Admitting: Lactation Services

## 2018-02-14 ENCOUNTER — Telehealth: Payer: Self-pay | Admitting: Certified Nurse Midwife

## 2018-02-14 NOTE — Telephone Encounter (Signed)
The patient called and stated that she would like to speak with her nurse or a provider in regards to her needing a possible appointment. The patient is experiencing leg numbness, and falling to the floor shortly after. The patient also stated that her knee caps hurt as well as having severe back pain. The patient would like a call back as soon as possible. Please advise.

## 2018-06-26 ENCOUNTER — Other Ambulatory Visit: Payer: Self-pay

## 2018-06-26 ENCOUNTER — Encounter: Payer: Self-pay | Admitting: Emergency Medicine

## 2018-06-26 ENCOUNTER — Inpatient Hospital Stay
Admission: EM | Admit: 2018-06-26 | Discharge: 2018-06-29 | DRG: 872 | Disposition: A | Discharge status: Home / Self Care | Payer: Medicaid Other | Attending: Internal Medicine | Admitting: Internal Medicine

## 2018-06-26 ENCOUNTER — Emergency Department: Payer: Medicaid Other

## 2018-06-26 ENCOUNTER — Emergency Department
Admission: EM | Admit: 2018-06-26 | Discharge: 2018-06-26 | Disposition: A | Discharge status: Home / Self Care | Payer: Medicaid Other | Attending: Student in an Organized Health Care Education/Training Program | Admitting: Student in an Organized Health Care Education/Training Program

## 2018-06-26 DIAGNOSIS — N12 Tubulo-interstitial nephritis, not specified as acute or chronic: Secondary | ICD-10-CM | POA: Diagnosis present

## 2018-06-26 DIAGNOSIS — R1011 Right upper quadrant pain: Secondary | ICD-10-CM | POA: Insufficient documentation

## 2018-06-26 DIAGNOSIS — N10 Acute tubulo-interstitial nephritis: Secondary | ICD-10-CM | POA: Diagnosis present

## 2018-06-26 DIAGNOSIS — Z79899 Other long term (current) drug therapy: Secondary | ICD-10-CM

## 2018-06-26 DIAGNOSIS — Z87891 Personal history of nicotine dependence: Secondary | ICD-10-CM

## 2018-06-26 DIAGNOSIS — R109 Unspecified abdominal pain: Secondary | ICD-10-CM

## 2018-06-26 DIAGNOSIS — A419 Sepsis, unspecified organism: Principal | ICD-10-CM | POA: Diagnosis present

## 2018-06-26 LAB — CBC
HEMATOCRIT: 42.5 % (ref 35.0–47.0)
Hemoglobin: 14.3 g/dL (ref 12.0–16.0)
MCH: 26.8 pg (ref 26.0–34.0)
MCHC: 33.6 g/dL (ref 32.0–36.0)
MCV: 79.7 fL — ABNORMAL LOW (ref 80.0–100.0)
Platelets: 264 10*3/uL (ref 150–440)
RBC: 5.33 MIL/uL — AB (ref 3.80–5.20)
RDW: 13.8 % (ref 11.5–14.5)
WBC: 23 10*3/uL — AB (ref 3.6–11.0)

## 2018-06-26 LAB — URINALYSIS, COMPLETE (UACMP) WITH MICROSCOPIC
BILIRUBIN URINE: NEGATIVE
Glucose, UA: NEGATIVE mg/dL
KETONES UR: 5 mg/dL — AB
NITRITE: NEGATIVE
PROTEIN: 100 mg/dL — AB
SPECIFIC GRAVITY, URINE: 1.026 (ref 1.005–1.030)
pH: 5 (ref 5.0–8.0)

## 2018-06-26 LAB — COMPREHENSIVE METABOLIC PANEL
ALT: 30 U/L (ref 0–44)
ANION GAP: 8 (ref 5–15)
AST: 26 U/L (ref 15–41)
Albumin: 4.1 g/dL (ref 3.5–5.0)
Alkaline Phosphatase: 63 U/L (ref 38–126)
BILIRUBIN TOTAL: 1.1 mg/dL (ref 0.3–1.2)
BUN: 13 mg/dL (ref 6–20)
CO2: 21 mmol/L — ABNORMAL LOW (ref 22–32)
Calcium: 9.3 mg/dL (ref 8.9–10.3)
Chloride: 107 mmol/L (ref 98–111)
Creatinine, Ser: 0.76 mg/dL (ref 0.44–1.00)
GFR calc Af Amer: >60 mL/min (ref >60)
Glucose, Bld: 158 mg/dL — ABNORMAL HIGH (ref 70–99)
POTASSIUM: 3.3 mmol/L — AB (ref 3.5–5.1)
Sodium: 136 mmol/L (ref 135–145)
TOTAL PROTEIN: 8.1 g/dL (ref 6.5–8.1)

## 2018-06-26 LAB — POCT PREGNANCY, URINE: PREG TEST UR: NEGATIVE

## 2018-06-26 MED ORDER — PROMETHAZINE HCL 25 MG/ML IJ SOLN
12.5000 mg | Freq: Four times a day (QID) | INTRAMUSCULAR | Status: DC | PRN
  Administered 2018-06-26: 12.5 mg via INTRAVENOUS
  Filled 2018-06-26: qty 1

## 2018-06-26 MED ORDER — SODIUM CHLORIDE 0.9 % IV SOLN
1.0000 g | Freq: Once | INTRAVENOUS | Status: AC
  Administered 2018-06-26: 1 g via INTRAVENOUS
  Filled 2018-06-26 (×2): qty 10

## 2018-06-26 MED ORDER — FENTANYL CITRATE (PF) 100 MCG/2ML IJ SOLN
50.0000 ug | INTRAMUSCULAR | Status: DC | PRN
  Administered 2018-06-26: 50 ug via INTRAVENOUS
  Filled 2018-06-26: qty 2

## 2018-06-26 MED ORDER — HYDROCODONE-ACETAMINOPHEN 5-325 MG PO TABS: 1.0000 | ORAL_TABLET | ORAL | 0 refills | Status: DC | PRN

## 2018-06-26 MED ORDER — SODIUM CHLORIDE 0.9 % IV BOLUS
1000.0000 mL | Freq: Once | INTRAVENOUS | Status: AC
  Administered 2018-06-26: 1000 mL via INTRAVENOUS

## 2018-06-26 MED ORDER — MORPHINE SULFATE (PF) 4 MG/ML IV SOLN
4.0000 mg | INTRAVENOUS | Status: DC | PRN
  Administered 2018-06-26: 4 mg via INTRAVENOUS
  Filled 2018-06-26 (×2): qty 1

## 2018-06-26 MED ORDER — PROMETHAZINE HCL 12.5 MG PO TABS: 12.5000 mg | ORAL_TABLET | Freq: Four times a day (QID) | ORAL | 0 refills | Status: DC | PRN

## 2018-06-26 MED ORDER — SODIUM CHLORIDE 0.9 % IV BOLUS
1000.0000 mL | Freq: Once | INTRAVENOUS | Status: AC
Start: 2018-06-26 — End: 2018-06-26
  Administered 2018-06-26: 1000 mL via INTRAVENOUS

## 2018-06-26 MED ORDER — KETOROLAC TROMETHAMINE 30 MG/ML IJ SOLN
15.0000 mg | Freq: Once | INTRAMUSCULAR | Status: AC
  Administered 2018-06-26: 15 mg via INTRAVENOUS
  Filled 2018-06-26: qty 1

## 2018-06-26 MED ORDER — CEPHALEXIN 500 MG PO CAPS: 500.0000 mg | ORAL_CAPSULE | Freq: Three times a day (TID) | ORAL | 0 refills | Status: DC

## 2018-06-26 NOTE — ED Notes (Signed)
Patient transported to Ultrasound 

## 2018-06-26 NOTE — ED Triage Notes (Signed)
Pt to triage via w/c with no distress noted; pt left 2hrs PTA and dx with UTI but cont to vomit

## 2018-06-26 NOTE — ED Provider Notes (Signed)
[facility] Emergency Department Provider Note  ____________________________________________   None    (approximate)  I have reviewed the triage vital signs and the nursing notes.   HISTORY  Chief Complaint Flank Pain   HPI Kathleen Glass is a 22 y.o. female to the emergency department for evaluation of right flank pain.  She was evaluated earlier today and given Rocephin for pyelonephritis.  Patient chose to leave and was advised to return to the emergency department if she began to feel worse.  A couple hours after she left she states that she began to feel nauseated and vomited which prompted her to return. History reviewed. No pertinent past medical history.  Patient Active Problem List   Diagnosis Date Noted  . Pyelonephritis 06/27/2018  . Lump or mass in breast 05/30/2013  . 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 BREAST SURGERY Left 06-12-13   benign fibroadenoma    Prior to Admission medications   Medication Sig Start Date End Date Taking? Authorizing Provider  acetaminophen (TYLENOL) 500 MG tablet Take 500 mg by mouth every 6 (six) hours as needed for mild pain, fever or headache.   Yes [provider]  ibuprofen (ADVIL,MOTRIN) 600 MG tablet Take 1 tablet (600 mg total) by mouth every 6 (six) hours. Patient taking differently: Take 600 mg by mouth every 6 (six) hours as needed for fever, headache or mild pain.  10/09/17  Yes Lawhorn, Lara Mulch, CNM  cephALEXin (KEFLEX) 500 MG capsule Take 1 capsule (500 mg total) by mouth 3 (three) times daily for 7 days. 06/26/18 07/03/18  Merlyn Lot, MD  HYDROcodone-acetaminophen (NORCO) 5-325 MG tablet Take 1 tablet by mouth every 4 (four) hours as needed for moderate pain. 06/26/18   Merlyn Lot, MD  promethazine (PHENERGAN) 12.5 MG tablet Take 1 tablet (12.5 mg total) by mouth every 6 (six) hours as needed for nausea or vomiting.  06/26/18   Merlyn Lot, MD    Allergies Patient has no known allergies.  Family History  Problem Relation Age of Onset  . Breast cancer Other   . Breast cancer Paternal Aunt        great aunt    Social History Social History   Tobacco Use  . Smoking status: Former Smoker    Packs/day: 0.50    Types: Cigarettes  . Smokeless tobacco: Never Used  Substance Use Topics  . Alcohol use: No  . Drug use: No    Review of Systems  Constitutional: Positive for fever. Eyes: No visual changes. ENT: No sore throat. Cardiovascular: Denies chest pain. Respiratory: Denies shortness of breath. Gastrointestinal: No abdominal pain.  No nausea, no vomiting.  No diarrhea.  No constipation. Genitourinary: Positive for dysuria. Musculoskeletal: Positive for right side. Skin: Negative for rash. Neurological: Negative for headaches, focal weakness or numbness. ____________________________________________   PHYSICAL EXAM:  VITAL SIGNS: ED Triage Vitals  Enc Vitals Group     BP 06/26/18 2233 118/62     Pulse Rate 06/26/18 2233 (!) 123     Resp 06/26/18 2233 18     Temp 06/26/18 2233 (!) 102.8 F (39.3 C)     Temp Source 06/26/18 2233 Oral     SpO2 06/26/18 2233 100 %     Weight 06/26/18 2236 119 lb 14.9 oz (54.4 kg)     Height 06/26/18 2236 5\' 2"  (1.575 m)     Head Circumference --  Peak Flow --      Pain Score 06/26/18 2236 10     Pain Loc --      Pain Edu? --      Excl. in [place]? --     Constitutional: Alert and oriented. Well appearing and in no acute distress. Eyes: Conjunctivae are normal. Head: Atraumatic. Nose: No congestion/rhinnorhea. Mouth/Throat: Mucous membranes are moist.  Oropharynx non-erythematous. Neck: No stridor.   Cardiovascular: Normal rate, regular rhythm. Grossly normal heart sounds.  Good peripheral circulation. Respiratory: Normal respiratory effort.  No retractions. Lungs CTAB. Gastrointestinal: Soft and nontender. No distention. No abdominal  bruits. Right side CVA tenderness. Musculoskeletal: No lower extremity tenderness nor edema.  No joint effusions. Neurologic:  Normal speech and language. No gross focal neurologic deficits are appreciated. No gait instability. Skin:  Skin is warm, dry and intact. No rash noted. Psychiatric: Mood and affect are normal. Speech and behavior are normal.  ____________________________________________   LABS (all labs ordered are listed, but only abnormal results are displayed)  Labs Reviewed  CULTURE, BLOOD (ROUTINE X 2)  CULTURE, BLOOD (ROUTINE X 2)  LACTIC ACID, PLASMA  LACTIC ACID, PLASMA   ____________________________________________  EKG  Not indicated. ____________________________________________  RADIOLOGY  ED MD interpretation:  Right side pyelonephritis.  Official radiology report(s): Ct Abdomen Pelvis W Contrast  Result Date: 06/27/2018 CLINICAL DATA:  Fever, abdominal pain, vomiting EXAM: CT ABDOMEN AND PELVIS WITH CONTRAST TECHNIQUE: Multidetector CT imaging of the abdomen and pelvis was performed using the standard protocol following bolus administration of intravenous contrast. CONTRAST:  87mL OMNIPAQUE IOHEXOL 300 MG/ML  SOLN COMPARISON:  Renal ultrasound 06/26/2018. FINDINGS: Lower chest: Dependent bibasilar atelectasis.  No effusions. Hepatobiliary: No focal hepatic abnormality. Gallbladder unremarkable. Pancreas: No focal abnormality or ductal dilatation. Spleen: No focal abnormality.  Normal size. Adrenals/Urinary Tract: Areas of decreased enhancement throughout the right kidney compatible with pyelonephritis. Mild perinephric stranding. No hydronephrosis. Left kidney, adrenal glands and urinary bladder unremarkable. Stomach/Bowel: Appendix is visualized and is normal. Stomach, large and small bowel grossly unremarkable. Vascular/Lymphatic: No evidence of aneurysm or adenopathy. Reproductive: Uterus and adnexa unremarkable.  No mass. Other: No free fluid or free air.  Musculoskeletal: No acute bony abnormality. IMPRESSION: Areas of decreased enhancement throughout the right kidney compatible with pyelonephritis. Mild perinephric stranding. No hydronephrosis. Electronically Signed   By: Rolm Baptise M.D.   On: 06/27/2018 01:08   US Renal  Result Date: 06/26/2018 CLINICAL DATA:  RIGHT flank pain for 4 days EXAM: RENAL / URINARY TRACT ULTRASOUND COMPLETE COMPARISON:  None FINDINGS: Right Kidney: Length: 12.7 cm. Normal cortical thickness. Slightly increased cortical echogenicity. No mass, hydronephrosis or shadowing calcification. Left Kidney: Length: 11.2 cm. Normal cortical thickness. Normal cortical echogenicity. No mass, hydronephrosis or shadowing calcification. Bladder: Partially distended, grossly unremarkable. IMPRESSION: No evidence of renal mass or hydronephrosis. Slightly increased RIGHT renal cortical echogenicity, nonspecific and of uncertain etiology, can be seen with medical renal disease changes but LEFT kidney does not demonstrate similar findings. Electronically Signed   By: Lavonia Dana M.D.   On: 06/26/2018 18:11   US Abdomen Limited Ruq  Result Date: 06/26/2018 CLINICAL DATA:  RIGHT upper abdominal pain/flank pain for 4 days. EXAM: ULTRASOUND ABDOMEN LIMITED RIGHT UPPER QUADRANT COMPARISON:  None. FINDINGS: Gallbladder: No gallstones or wall thickening visualized. No sonographic Murphy sign noted by sonographer. Common bile duct: Diameter: 2 mm. Liver: No focal lesion identified. Within normal limits in parenchymal echogenicity. Portal vein is patent on color Doppler imaging with normal direction of  blood flow towards the liver. Mildly echogenic RIGHT kidney. IMPRESSION: 1. Mildly echogenic RIGHT kidney, please see renal ultrasound from same day, reported separately. 2. Otherwise negative RIGHT upper quadrant ultrasound. Electronically Signed   By: Elon Alas M.D.   On: 06/26/2018 18:11     ____________________________________________   PROCEDURES  Procedure(s) performed: None  Procedures  Critical Care performed: No  ____________________________________________   INITIAL IMPRESSION / ASSESSMENT AND PLAN / ED COURSE  As part of my medical decision making, I reviewed the following data within the electronic MEDICAL RECORD NUMBER    22 year old female presenting to the emergency department for treatment of pyelonephritis. She had 1g of Rocephin while here earlier. WBC 23 earlier as well. She will be admitted for pain control and antibiotics.      ____________________________________________   FINAL CLINICAL IMPRESSION(S) / ED DIAGNOSES  Final diagnoses:  Pyelonephritis, acute     ED Discharge Orders    None       Note:  This document was prepared using Dragon voice recognition software and may include unintentional dictation errors.    Victorino Dike, FNP 06/27/18 0249    Merlyn Lot, MD 07/03/18 1504

## 2018-06-26 NOTE — ED Provider Notes (Signed)
[facility] Emergency Department Provider Note    First MD Initiated Contact with Patient 06/26/18 1654     (approximate)  I have reviewed the triage vital signs and the nursing notes.   HISTORY  Chief Complaint Flank Pain    HPI Kathleen Glass is a 22 y.o. female history of UTIs as a child presents to the ER with right flank pain is progressively worsening for the past 2 days associated with nausea and vomiting and right upper quadrant pain.  Denies any dysuria hematuria change in color or odor.  States that she had fever to 102 yesterday.  No lower abdominal pain.  Last menstrual cycle was 2 weeks ago but denies any vaginal discharge or pelvic discomfort.  Denies any shortness of breath or chest pain.  No cough.  No recent antibiotic use.  States the pain currently is mild to moderate in severity and nonradiating.    History reviewed. No pertinent past medical history. Family History  Problem Relation Age of Onset  . Breast cancer Other   . Breast cancer Paternal Aunt        great aunt   Past Surgical History:  Procedure Laterality Date  . BREAST SURGERY Left 06-12-13   benign fibroadenoma   Patient Active Problem List   Diagnosis Date Noted  . Lump or mass in breast 05/30/2013  . 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 Authorizing Provider  cephALEXin (KEFLEX) 500 MG capsule Take 1 capsule (500 mg total) by mouth 3 (three) times daily for 7 days. 06/26/18 07/03/18  Merlyn Lot, MD  ferrous sulfate 325 (65 FE) MG tablet Take 1 tablet (325 mg total) by mouth daily with breakfast. 10/09/17   Lawhorn, Lara Mulch, CNM  HYDROcodone-acetaminophen (NORCO) 5-325 MG tablet Take 1 tablet by mouth every 4 (four) hours as needed for moderate pain. 06/26/18   Merlyn Lot, MD  ibuprofen (ADVIL,MOTRIN) 600 MG tablet Take 1 tablet (600 mg total) by mouth every 6 (six) hours.  10/09/17   Diona Fanti, CNM  Prenatal Multivit-Min-Fe-FA (PRENATAL VITAMINS) 0.8 MG tablet Take 1 tablet by mouth daily. 01/23/17   Eula Listen, MD  promethazine (PHENERGAN) 12.5 MG tablet Take 1 tablet (12.5 mg total) by mouth every 6 (six) hours as needed for nausea or vomiting. 06/26/18   Merlyn Lot, MD  ranitidine (ZANTAC) 150 MG capsule Take 1 capsule (150 mg total) by mouth 2 (two) times daily. 08/16/17   Diona Fanti, CNM    Allergies Patient has no known allergies.    Social History Social History   Tobacco Use  . Smoking status: Former Smoker    Packs/day: 0.50    Types: Cigarettes  . Smokeless tobacco: Never Used  Substance Use Topics  . Alcohol use: No  . Drug use: No    Review of Systems Patient denies headaches, rhinorrhea, blurry vision, numbness, shortness of breath, chest pain, edema, cough, abdominal pain, nausea, vomiting, diarrhea, dysuria, fevers, rashes or hallucinations unless otherwise stated above in HPI. ____________________________________________   PHYSICAL EXAM:  VITAL SIGNS: Vitals:   06/26/18 1607 06/26/18 2016  BP: (!) 112/52 120/81  Pulse: (!) 116 87  Resp: 18 17  Temp: 99.9 F (37.7 C)   SpO2: 96% 100%    Constitutional: Alert and oriented.  Eyes: Conjunctivae are normal.  Head: Atraumatic. Nose: No congestion/rhinnorhea. Mouth/Throat: Mucous membranes are moist.   Neck: No  stridor. Painless ROM.  Cardiovascular: Normal rate, regular rhythm. Grossly normal heart sounds.  Good peripheral circulation. Respiratory: Normal respiratory effort.  No retractions. Lungs CTAB. Gastrointestinal: Soft and nontender. No distention. No abdominal bruits. + right CVA tenderness. Genitourinary: deferred Musculoskeletal: No lower extremity tenderness nor edema.  No joint effusions. Neurologic:  Normal speech and language. No gross focal neurologic deficits are appreciated. No facial droop Skin:  Skin is  warm, dry and intact. No rash noted. Psychiatric: Mood and affect are normal. Speech and behavior are normal.  ____________________________________________   LABS (all labs ordered are listed, but only abnormal results are displayed)  Results for orders placed or performed during the hospital encounter of 06/26/18 (from the past 24 hour(s))  Comprehensive metabolic panel     Status: Abnormal   Collection Time: 06/26/18  4:20 PM  Result Value Ref Range   Sodium 136 135 - 145 mmol/L   Potassium 3.3 (L) 3.5 - 5.1 mmol/L   Chloride 107 98 - 111 mmol/L   CO2 21 (L) 22 - 32 mmol/L   Glucose, Bld 158 (H) 70 - 99 mg/dL   BUN 13 6 - 20 mg/dL   Creatinine, Ser 0.76 0.44 - 1.00 mg/dL   Calcium 9.3 8.9 - 10.3 mg/dL   Total Protein 8.1 6.5 - 8.1 g/dL   Albumin 4.1 3.5 - 5.0 g/dL   AST 26 15 - 41 U/L   ALT 30 0 - 44 U/L   Alkaline Phosphatase 63 38 - 126 U/L   Total Bilirubin 1.1 0.3 - 1.2 mg/dL   GFR calc non Af Amer >60 >60 mL/min   GFR calc Af Amer >60 >60 mL/min   Anion gap 8 5 - 15  CBC     Status: Abnormal   Collection Time: 06/26/18  4:20 PM  Result Value Ref Range   WBC 23.0 (H) 3.6 - 11.0 K/uL   RBC 5.33 (H) 3.80 - 5.20 MIL/uL   Hemoglobin 14.3 12.0 - 16.0 g/dL   HCT 42.5 35.0 - 47.0 %   MCV 79.7 (L) 80.0 - 100.0 fL   MCH 26.8 26.0 - 34.0 pg   MCHC 33.6 32.0 - 36.0 g/dL   RDW 13.8 11.5 - 14.5 %   Platelets 264 150 - 440 K/uL  Urinalysis, Complete w Microscopic     Status: Abnormal   Collection Time: 06/26/18  4:21 PM  Result Value Ref Range   Color, Urine YELLOW (A) YELLOW   APPearance TURBID (A) CLEAR   Specific Gravity, Urine 1.026 1.005 - 1.030   pH 5.0 5.0 - 8.0   Glucose, UA NEGATIVE NEGATIVE mg/dL   Hgb urine dipstick LARGE (A) NEGATIVE   Bilirubin Urine NEGATIVE NEGATIVE   Ketones, ur 5 (A) NEGATIVE mg/dL   Protein, ur 100 (A) NEGATIVE mg/dL   Nitrite NEGATIVE NEGATIVE   Leukocytes, UA MODERATE (A) NEGATIVE   RBC / HPF >50 (H) 0 - 5 RBC/hpf   WBC, UA >50 (H)  0 - 5 WBC/hpf   Bacteria, UA FEW (A) NONE SEEN   Squamous Epithelial / LPF 21-50 0 - 5   Mucus PRESENT   Pregnancy, urine POC     Status: None   Collection Time: 06/26/18  4:38 PM  Result Value Ref Range   Preg Test, Ur NEGATIVE NEGATIVE   ____________________________________________  ____________________________________________  RADIOLOGY  I personally reviewed all radiographic images ordered to evaluate for the above acute complaints and reviewed radiology reports and findings.  These findings were  personally discussed with the patient.  Please see medical record for radiology report.  ____________________________________________   PROCEDURES  Procedure(s) performed:  Procedures    Critical Care performed: no ____________________________________________   INITIAL IMPRESSION / ASSESSMENT AND PLAN / ED COURSE  Pertinent labs & imaging results that were available during my care of the patient were reviewed by me and considered in my medical decision making (see chart for details).   DDX: uti, pyelo, stone, choleithiasis, cholecystitis, enteritis  Dessie Glass is a 22 y.o. who presents to the ED with symptoms as described above.  Her abdominal exam anteriorly is soft.  Does have mild right upper quadrant pain but also with right flank pain which seems to be the primary source of her discomfort.  Based on presentation I am concerned for pyonephritis.  Possible stone.  Will order right upper quadrant ultrasound and renal ultrasound to further evaluate.  Does not seem clinically consistent with appendicitis.  Will give IV fluids as well as IV pain medication and reassess.  Clinical Course as of Jun 27 2019  Mon Jun 26, 2018  1755 Patient with large leukocytes and many WBCs with few bacteria.  Patient is not pregnant.  Based on presentation and fever will treat with IV Rocephin for concern for pyelonephritis.   [PR]  1816 No evidence of hydronephrosis.  Likely pyelonephritis  given her presentation.   [PR]  1918 Patient reassessed.  States she feels well.  No pain.  Resting comfortably.  States she feels much improved after fluids.  Ultrasound examination is reassuring that there is no evidence of hydro-obstructive uropathy.  Will p.o. challenge.  Discussed option for hospitalization patient requesting discharge home.  I think this is a reasonable plan as long as she is able to prove that she is able to tolerate oral hydration.  We will continue to observe her and patient is agreeable to this plan at this time.   [PR]  2016 Abdominal exam is soft and benign.  This not clinically consistent with appendicitis.  Repeat vitals markedly improved.  Patient tolerating oral hydration.  Patient stable and appropriate for outpatient follow-up.   [PR]    Clinical Course User Index [PR] Merlyn Lot, MD     As part of my medical decision making, I reviewed the following data within the [place] notes reviewed and incorporated, Labs reviewed, notes from prior ED visits and [place] Controlled Substance Database   ____________________________________________   FINAL CLINICAL IMPRESSION(S) / ED DIAGNOSES  Final diagnoses:  Right flank pain  Pyelonephritis      NEW MEDICATIONS STARTED DURING THIS VISIT:  New Prescriptions   CEPHALEXIN (KEFLEX) 500 MG CAPSULE    Take 1 capsule (500 mg total) by mouth 3 (three) times daily for 7 days.   HYDROCODONE-ACETAMINOPHEN (NORCO) 5-325 MG TABLET    Take 1 tablet by mouth every 4 (four) hours as needed for moderate pain.   PROMETHAZINE (PHENERGAN) 12.5 MG TABLET    Take 1 tablet (12.5 mg total) by mouth every 6 (six) hours as needed for nausea or vomiting.     Note:  This document was prepared using Dragon voice recognition software and may include unintentional dictation errors.    Merlyn Lot, MD 06/26/18 2020

## 2018-06-26 NOTE — ED Notes (Signed)
PT in with co right lower back pain that started 4 days ago no hx of the same. Denies any dysuria or fever. PT is pain free at this time.

## 2018-06-26 NOTE — ED Triage Notes (Signed)
R flank pain x 2 days. Denies dysuria. Nausea with vomiting.

## 2018-06-26 NOTE — Discharge Instructions (Signed)

## 2018-06-27 ENCOUNTER — Other Ambulatory Visit: Payer: Self-pay

## 2018-06-27 ENCOUNTER — Encounter: Payer: Self-pay | Admitting: Radiology

## 2018-06-27 ENCOUNTER — Emergency Department: Payer: Medicaid Other

## 2018-06-27 DIAGNOSIS — N12 Tubulo-interstitial nephritis, not specified as acute or chronic: Secondary | ICD-10-CM | POA: Diagnosis present

## 2018-06-27 LAB — HEMOGLOBIN A1C
Hgb A1c MFr Bld: 5.1 % (ref 4.8–5.6)
Mean Plasma Glucose: 99.67 mg/dL

## 2018-06-27 LAB — LACTIC ACID, PLASMA: Lactic Acid, Venous: 0.9 mmol/L (ref 0.5–1.9)

## 2018-06-27 LAB — TSH: TSH: 0.337 u[IU]/mL — ABNORMAL LOW (ref 0.350–4.500)

## 2018-06-27 MED ORDER — DOCUSATE SODIUM 100 MG PO CAPS
100.0000 mg | ORAL_CAPSULE | Freq: Two times a day (BID) | ORAL | Status: DC
  Administered 2018-06-28 – 2018-06-29 (×3): 100 mg via ORAL
  Filled 2018-06-27 (×4): qty 1

## 2018-06-27 MED ORDER — MORPHINE SULFATE (PF) 4 MG/ML IV SOLN
4.0000 mg | INTRAVENOUS | Status: AC | PRN
  Administered 2018-06-27 – 2018-06-28 (×4): 4 mg via INTRAVENOUS
  Filled 2018-06-27 (×3): qty 1

## 2018-06-27 MED ORDER — MORPHINE SULFATE (PF) 4 MG/ML IV SOLN
4.0000 mg | Freq: Once | INTRAVENOUS | Status: AC
  Administered 2018-06-27: 4 mg via INTRAVENOUS
  Filled 2018-06-27: qty 1

## 2018-06-27 MED ORDER — ACETAMINOPHEN 325 MG PO TABS
650.0000 mg | ORAL_TABLET | Freq: Four times a day (QID) | ORAL | Status: DC | PRN
  Administered 2018-06-27 – 2018-06-28 (×2): 650 mg via ORAL
  Filled 2018-06-27 (×2): qty 2

## 2018-06-27 MED ORDER — ONDANSETRON HCL 4 MG/2ML IJ SOLN
4.0000 mg | Freq: Four times a day (QID) | INTRAMUSCULAR | Status: DC | PRN
  Administered 2018-06-28 (×2): 4 mg via INTRAVENOUS
  Filled 2018-06-27 (×2): qty 2

## 2018-06-27 MED ORDER — PROMETHAZINE HCL 25 MG/ML IJ SOLN: 12.5000 mg | Freq: Four times a day (QID) | INTRAMUSCULAR | Status: DC | PRN

## 2018-06-27 MED ORDER — ONDANSETRON HCL 4 MG/2ML IJ SOLN
4.0000 mg | Freq: Once | INTRAMUSCULAR | Status: AC
  Administered 2018-06-27: 4 mg via INTRAVENOUS
  Filled 2018-06-27: qty 2

## 2018-06-27 MED ORDER — HYDROCODONE-ACETAMINOPHEN 5-325 MG PO TABS
1.0000 | ORAL_TABLET | ORAL | Status: DC | PRN
  Administered 2018-06-27 – 2018-06-28 (×4): 1 via ORAL
  Filled 2018-06-27 (×4): qty 1

## 2018-06-27 MED ORDER — SODIUM CHLORIDE 0.9 % IV SOLN
INTRAVENOUS | Status: DC
  Administered 2018-06-27 – 2018-06-29 (×4): via INTRAVENOUS

## 2018-06-27 MED ORDER — IOHEXOL 300 MG/ML  SOLN
75.0000 mL | Freq: Once | INTRAMUSCULAR | Status: AC | PRN
  Administered 2018-06-27: 75 mL via INTRAVENOUS

## 2018-06-27 MED ORDER — PIPERACILLIN-TAZOBACTAM 3.375 G IVPB
3.3750 g | Freq: Three times a day (TID) | INTRAVENOUS | Status: DC
  Administered 2018-06-27: 3.375 g via INTRAVENOUS
  Filled 2018-06-27 (×2): qty 50

## 2018-06-27 MED ORDER — ACETAMINOPHEN 650 MG RE SUPP: 650.0000 mg | Freq: Four times a day (QID) | RECTAL | Status: DC | PRN

## 2018-06-27 MED ORDER — ONDANSETRON HCL 4 MG PO TABS: 4.0000 mg | ORAL_TABLET | Freq: Four times a day (QID) | ORAL | Status: DC | PRN

## 2018-06-27 MED ORDER — ENOXAPARIN SODIUM 40 MG/0.4ML ~~LOC~~ SOLN
40.0000 mg | SUBCUTANEOUS | Status: DC
  Administered 2018-06-27 – 2018-06-28 (×2): 40 mg via SUBCUTANEOUS
  Filled 2018-06-27 (×2): qty 0.4

## 2018-06-27 MED ORDER — POTASSIUM CHLORIDE CRYS ER 20 MEQ PO TBCR
20.0000 meq | EXTENDED_RELEASE_TABLET | Freq: Once | ORAL | Status: DC
  Filled 2018-06-27 (×2): qty 1

## 2018-06-27 MED ORDER — SODIUM CHLORIDE 0.9 % IV SOLN
1.0000 g | INTRAVENOUS | Status: DC
  Administered 2018-06-27 – 2018-06-28 (×2): 1 g via INTRAVENOUS
  Filled 2018-06-27: qty 10
  Filled 2018-06-27: qty 1
  Filled 2018-06-27: qty 10

## 2018-06-27 MED ORDER — IOPAMIDOL (ISOVUE-300) INJECTION 61%: 100.0000 mL | Freq: Once | INTRAVENOUS | Status: DC | PRN

## 2018-06-27 NOTE — ED Notes (Signed)
Report off to allison rn  

## 2018-06-27 NOTE — Progress Notes (Signed)
Pharmacy Antibiotic Note  Kathleen Glass is a 22 y.o. female admitted on 06/26/2018 with sepsis secondary to pyelonephritis.  Pharmacy has been consulted for Zosyn dosing. The patient received one dose of cetriaxone 8/26 @ 1800.  Plan: Start Zosyn 3.375 g IV q8h extended infusion with first dose today @ 0800  Height: 5\' 2"  (157.5 cm) Weight: 119 lb 14.4 oz (54.4 kg) IBW/kg (Calculated) : 50.1  Temp (24hrs), Avg:102.1 F (38.9 C), Min:99.9 F (37.7 C), Max:103.3 F (39.6 C)  Recent Labs  Lab 06/26/18 1620 06/26/18 2334  WBC 23.0*  --   CREATININE 0.76  --   LATICACIDVEN  --  0.9    Estimated Creatinine Clearance: 88.7 mL/min (by C-G formula based on SCr of 0.76 mg/dL).    No Known Allergies  Antimicrobials this admission: Ceftriaxone 8/26 >> 8/26 (one dose) Zosyn 8/27 >>   Dose adjustments this admission:   Microbiology results: 8/26 BCx: NG 12 hrs   Thank you for allowing pharmacy to be a part of this patient's care.  Tawnya Crook, PharmD Pharmacy Resident  06/27/2018 7:32 AM

## 2018-06-27 NOTE — ED Notes (Signed)
Patient transported to CT 

## 2018-06-27 NOTE — Progress Notes (Addendum)
[facility] [place] NAME: Kathleen Glass    MR#:  [id number]  DATE OF BIRTH:  [birth year]  SUBJECTIVE:  CHIEF COMPLAINT:  Pt is tired and reporting back pain but denies any nausea or vomiting.  Febrile  REVIEW OF SYSTEMS:  CONSTITUTIONAL: No fever, fatigue or weakness.  EYES: No blurred or double vision.  EARS, NOSE, AND THROAT: No tinnitus or ear pain.  RESPIRATORY: No cough, shortness of breath, wheezing or hemoptysis.  CARDIOVASCULAR: No chest pain, orthopnea, edema.  GASTROINTESTINAL: No nausea, vomiting, diarrhea or abdominal pain.  Reporting back pain GENITOURINARY: No dysuria, hematuria.  ENDOCRINE: No polyuria, nocturia,  HEMATOLOGY: No anemia, easy bruising or bleeding SKIN: No rash or lesion. MUSCULOSKELETAL: No joint pain or arthritis.   NEUROLOGIC: No tingling, numbness, weakness.  PSYCHIATRY: No anxiety or depression.   DRUG ALLERGIES:  No Known Allergies  VITALS:  Blood pressure (!) 102/57, pulse 93, temperature 98.2 F (36.8 C), temperature source Oral, resp. rate 18, height 5\' 2"  (1.575 m), weight 54.4 kg, last menstrual period 06/12/2018, SpO2 99 %, not currently breastfeeding.  PHYSICAL EXAMINATION:  GENERAL:  22 y.o.-year-old patient lying in the bed with no acute distress.  EYES: Pupils equal, round, reactive to light and accommodation. No scleral icterus. Extraocular muscles intact.  HEENT: Head atraumatic, normocephalic. Oropharynx and nasopharynx clear.  NECK:  Supple, no jugular venous distention. No thyroid enlargement, no tenderness.  LUNGS: Normal breath sounds bilaterally, no wheezing, rales,rhonchi or crepitation. No use of accessory muscles of respiration.  CARDIOVASCULAR: S1, S2 normal. No murmurs, rubs, or gallops.  ABDOMEN: Soft, nontender, nondistended. Bowel sounds present.  Positive flank tenderness EXTREMITIES: No pedal edema, cyanosis, or clubbing.  NEUROLOGIC: Cranial nerves II through  XII are intact. Muscle strength 5/5 in all extremities. Sensation intact. Gait not checked.  PSYCHIATRIC: The patient is alert and oriented x 3.  SKIN: No obvious rash, lesion, or ulcer.    LABORATORY PANEL:   CBC Recent Labs  Lab 06/26/18 1620  WBC 23.0*  HGB 14.3  HCT 42.5  PLT 264   ------------------------------------------------------------------------------------------------------------------  Chemistries  Recent Labs  Lab 06/26/18 1620  NA 136  K 3.3*  CL 107  CO2 21*  GLUCOSE 158*  BUN 13  CREATININE 0.76  CALCIUM 9.3  AST 26  ALT 30  ALKPHOS 63  BILITOT 1.1   ------------------------------------------------------------------------------------------------------------------  Cardiac Enzymes No results for input(s): TROPONINI in the last 168 hours. ------------------------------------------------------------------------------------------------------------------  RADIOLOGY:  Ct Abdomen Pelvis W Contrast  Result Date: 06/27/2018 CLINICAL DATA:  Fever, abdominal pain, vomiting EXAM: CT ABDOMEN AND PELVIS WITH CONTRAST TECHNIQUE: Multidetector CT imaging of the abdomen and pelvis was performed using the standard protocol following bolus administration of intravenous contrast. CONTRAST:  84mL OMNIPAQUE IOHEXOL 300 MG/ML  SOLN COMPARISON:  Renal ultrasound 06/26/2018. FINDINGS: Lower chest: Dependent bibasilar atelectasis.  No effusions. Hepatobiliary: No focal hepatic abnormality. Gallbladder unremarkable. Pancreas: No focal abnormality or ductal dilatation. Spleen: No focal abnormality.  Normal size. Adrenals/Urinary Tract: Areas of decreased enhancement throughout the right kidney compatible with pyelonephritis. Mild perinephric stranding. No hydronephrosis. Left kidney, adrenal glands and urinary bladder unremarkable. Stomach/Bowel: Appendix is visualized and is normal. Stomach, large and small bowel grossly unremarkable. Vascular/Lymphatic: No evidence of aneurysm or  adenopathy. Reproductive: Uterus and adnexa unremarkable.  No mass. Other: No free fluid or free air. Musculoskeletal: No acute bony abnormality. IMPRESSION: Areas of decreased enhancement throughout the right kidney compatible with pyelonephritis. Mild perinephric stranding. No  hydronephrosis. Electronically Signed   By: Rolm Baptise M.D.   On: 06/27/2018 01:08   US Renal  Result Date: 06/26/2018 CLINICAL DATA:  RIGHT flank pain for 4 days EXAM: RENAL / URINARY TRACT ULTRASOUND COMPLETE COMPARISON:  None FINDINGS: Right Kidney: Length: 12.7 cm. Normal cortical thickness. Slightly increased cortical echogenicity. No mass, hydronephrosis or shadowing calcification. Left Kidney: Length: 11.2 cm. Normal cortical thickness. Normal cortical echogenicity. No mass, hydronephrosis or shadowing calcification. Bladder: Partially distended, grossly unremarkable. IMPRESSION: No evidence of renal mass or hydronephrosis. Slightly increased RIGHT renal cortical echogenicity, nonspecific and of uncertain etiology, can be seen with medical renal disease changes but LEFT kidney does not demonstrate similar findings. Electronically Signed   By: Lavonia Dana M.D.   On: 06/26/2018 18:11   US Abdomen Limited Ruq  Result Date: 06/26/2018 CLINICAL DATA:  RIGHT upper abdominal pain/flank pain for 4 days. EXAM: ULTRASOUND ABDOMEN LIMITED RIGHT UPPER QUADRANT COMPARISON:  None. FINDINGS: Gallbladder: No gallstones or wall thickening visualized. No sonographic Murphy sign noted by sonographer. Common bile duct: Diameter: 2 mm. Liver: No focal lesion identified. Within normal limits in parenchymal echogenicity. Portal vein is patent on color Doppler imaging with normal direction of blood flow towards the liver. Mildly echogenic RIGHT kidney. IMPRESSION: 1. Mildly echogenic RIGHT kidney, please see renal ultrasound from same day, reported separately. 2. Otherwise negative RIGHT upper quadrant ultrasound. Electronically Signed   By:  Elon Alas M.D.   On: 06/26/2018 18:11    EKG:  No orders found for this or any previous visit.  ASSESSMENT AND PLAN:   This is a 22 year old female admitted for pyelonephritis. 1.  Pyelonephritis:  Patient is still febrile D/c  Zosyn and start iv rocephin  de-escalate antibiotics based on the culture results  Manage nausea and vomiting symptomatically.   Hydrate with intravenous fluid. Renal ultrasound with no hydronephrosis  2.  Sepsis: The patient meets criteria via fever and leukocytosis.  She is hemodynamically stable.  Follow blood cultures as well as urine cultures for growth and sensitivities.  3.  Hyperthyroidism: The patient does not carry this diagnosis.  TSH found to be low.  Obtain T4.  Follow-up thyroid exam for nodules.      DVT prophylaxis: Lovenox   GI prophylaxis: None   The patient is a full code.    All the records are reviewed and case discussed with Care Management/Social Workerr. Management plans discussed with the patient, family and they are in agreement.  CODE STATUS: fc  TOTAL TIME TAKING CARE OF THIS PATIENT: 70minutes.   POSSIBLE D/C IN 1-2 DAYS, DEPENDING ON CLINICAL CONDITION.  Note: This dictation was prepared with Dragon dictation along with smaller phrase technology. Any transcriptional errors that result from this process are unintentional.   Nicholes Mango M.D on 06/27/2018 at 1:22 PM  Between 7am to 6pm - Pager - [phone] After 6pm go to [email or web address] - password [email or web address] [facility]  Office  [phone]  CC: Primary care physician; Patient, No Pcp Per

## 2018-06-27 NOTE — ED Notes (Signed)
Admitting MD at bedside.

## 2018-06-27 NOTE — H&P (Signed)
Kathleen Glass is an 22 y.o. female.   Chief Complaint: Flank pain HPI: The patient with no chronic medical problems presents to the emergency department with back pain.  The patient states that her flanks and back began to hurt proximally 4 days ago.  She has since felt more and more malaise and generalized weakness.  She was seen in the emergency department and prescribed oral antibiotics but then developed nausea and vomiting which prompted her return. CT scan showed pyelonephritis which prompted the emergency department staff calling the hospitalist service for admission.  History reviewed. No pertinent past medical history.  Past Surgical History:  Procedure Laterality Date  . BREAST SURGERY Left 06-12-13   benign fibroadenoma    Family History  Problem Relation Age of Onset  . Breast cancer Other   . Breast cancer Paternal Aunt        great aunt   Social History:  reports that she has quit smoking. Her smoking use included cigarettes. She smoked 0.50 packs per day. She has never used smokeless tobacco. She reports that she does not drink alcohol or use drugs.  Allergies: No Known Allergies  Medications Prior to Admission  Medication Sig Dispense Refill  . acetaminophen (TYLENOL) 500 MG tablet Take 500 mg by mouth every 6 (six) hours as needed for mild pain, fever or headache.    . ibuprofen (ADVIL,MOTRIN) 600 MG tablet Take 1 tablet (600 mg total) by mouth every 6 (six) hours. (Patient taking differently: Take 600 mg by mouth every 6 (six) hours as needed for fever, headache or mild pain. ) 30 tablet 0  . cephALEXin (KEFLEX) 500 MG capsule Take 1 capsule (500 mg total) by mouth 3 (three) times daily for 7 days. 21 capsule 0  . HYDROcodone-acetaminophen (NORCO) 5-325 MG tablet Take 1 tablet by mouth every 4 (four) hours as needed for moderate pain. 6 tablet 0  . promethazine (PHENERGAN) 12.5 MG tablet Take 1 tablet (12.5 mg total) by mouth every 6 (six) hours as needed for nausea or  vomiting. 12 tablet 0    Results for orders placed or performed during the hospital encounter of 06/26/18 (from the past 48 hour(s))  TSH     Status: Abnormal   Collection Time: 06/26/18  4:20 PM  Result Value Ref Range   TSH 0.337 (L) 0.350 - 4.500 uIU/mL    Comment: Performed by a 3rd Generation assay with a functional sensitivity of <=0.01 uIU/mL. Performed at [facility], [address] [place], [place] [address]   Lactic acid, plasma     Status: None   Collection Time: 06/26/18 11:34 PM  Result Value Ref Range   Lactic Acid, Venous 0.9 0.5 - 1.9 mmol/L    Comment: Performed at [facility], [address] [place],  [address]   Ct Abdomen Pelvis W Contrast  Result Date: 06/27/2018 CLINICAL DATA:  Fever, abdominal pain, vomiting EXAM: CT ABDOMEN AND PELVIS WITH CONTRAST TECHNIQUE: Multidetector CT imaging of the abdomen and pelvis was performed using the standard protocol following bolus administration of intravenous contrast. CONTRAST:  31mL OMNIPAQUE IOHEXOL 300 MG/ML  SOLN COMPARISON:  Renal ultrasound 06/26/2018. FINDINGS: Lower chest: Dependent bibasilar atelectasis.  No effusions. Hepatobiliary: No focal hepatic abnormality. Gallbladder unremarkable. Pancreas: No focal abnormality or ductal dilatation. Spleen: No focal abnormality.  Normal size. Adrenals/Urinary Tract: Areas of decreased enhancement throughout the right kidney compatible with pyelonephritis. Mild perinephric stranding. No hydronephrosis. Left kidney, adrenal glands and urinary bladder unremarkable. Stomach/Bowel: Appendix  is visualized and is normal. Stomach, large and small bowel grossly unremarkable. Vascular/Lymphatic: No evidence of aneurysm or adenopathy. Reproductive: Uterus and adnexa unremarkable.  No mass. Other: No free fluid or free air. Musculoskeletal: No acute bony abnormality. IMPRESSION: Areas of decreased enhancement throughout the right kidney compatible with pyelonephritis.  Mild perinephric stranding. No hydronephrosis. Electronically Signed   By: Rolm Baptise M.D.   On: 06/27/2018 01:08   US Renal  Result Date: 06/26/2018 CLINICAL DATA:  RIGHT flank pain for 4 days EXAM: RENAL / URINARY TRACT ULTRASOUND COMPLETE COMPARISON:  None FINDINGS: Right Kidney: Length: 12.7 cm. Normal cortical thickness. Slightly increased cortical echogenicity. No mass, hydronephrosis or shadowing calcification. Left Kidney: Length: 11.2 cm. Normal cortical thickness. Normal cortical echogenicity. No mass, hydronephrosis or shadowing calcification. Bladder: Partially distended, grossly unremarkable. IMPRESSION: No evidence of renal mass or hydronephrosis. Slightly increased RIGHT renal cortical echogenicity, nonspecific and of uncertain etiology, can be seen with medical renal disease changes but LEFT kidney does not demonstrate similar findings. Electronically Signed   By: Lavonia Dana M.D.   On: 06/26/2018 18:11   US Abdomen Limited Ruq  Result Date: 06/26/2018 CLINICAL DATA:  RIGHT upper abdominal pain/flank pain for 4 days. EXAM: ULTRASOUND ABDOMEN LIMITED RIGHT UPPER QUADRANT COMPARISON:  None. FINDINGS: Gallbladder: No gallstones or wall thickening visualized. No sonographic Murphy sign noted by sonographer. Common bile duct: Diameter: 2 mm. Liver: No focal lesion identified. Within normal limits in parenchymal echogenicity. Portal vein is patent on color Doppler imaging with normal direction of blood flow towards the liver. Mildly echogenic RIGHT kidney. IMPRESSION: 1. Mildly echogenic RIGHT kidney, please see renal ultrasound from same day, reported separately. 2. Otherwise negative RIGHT upper quadrant ultrasound. Electronically Signed   By: Elon Alas M.D.   On: 06/26/2018 18:11    Review of Systems  Constitutional: Positive for malaise/fatigue. Negative for chills and fever.  HENT: Negative for sore throat and tinnitus.   Eyes: Negative for blurred vision and redness.   Respiratory: Negative for cough and shortness of breath.   Cardiovascular: Negative for chest pain, palpitations, orthopnea and PND.  Gastrointestinal: Negative for abdominal pain, diarrhea, nausea and vomiting.  Genitourinary: Negative for dysuria, frequency and urgency.  Musculoskeletal: Positive for back pain. Negative for joint pain and myalgias.  Skin: Negative for rash.       No lesions  Neurological: Negative for speech change, focal weakness and weakness.  Endo/Heme/Allergies: Does not bruise/bleed easily.       No temperature intolerance  Psychiatric/Behavioral: Negative for depression and suicidal ideas.    Blood pressure 122/61, pulse 88, temperature (!) 101.1 F (38.4 C), temperature source Oral, resp. rate 18, height 5\' 2"  (1.575 m), weight 54.4 kg, last menstrual period 06/12/2018, SpO2 92 %, not currently breastfeeding. Physical Exam  Vitals reviewed. Constitutional: She is oriented to person, place, and time. She appears well-developed and well-nourished. No distress.  HENT:  Head: Normocephalic and atraumatic.  Mouth/Throat: Oropharynx is clear and moist.  Eyes: Pupils are equal, round, and reactive to light. Conjunctivae and EOM are normal. No scleral icterus.  Neck: Normal range of motion. Neck supple. No JVD present. No tracheal deviation present. No thyromegaly present.  Cardiovascular: Normal rate, regular rhythm and normal heart sounds. Exam reveals no gallop and no friction rub.  No murmur heard. Respiratory: Effort normal and breath sounds normal.  GI: Soft. Bowel sounds are normal. She exhibits no distension. There is no tenderness.  Genitourinary:  Genitourinary Comments: Deferred  Musculoskeletal: Normal  range of motion. She exhibits no edema.  Lymphadenopathy:    She has no cervical adenopathy.  Neurological: She is alert and oriented to person, place, and time. No cranial nerve deficit. She exhibits normal muscle tone.  Skin: Skin is warm and dry. No  rash noted. No erythema.  Psychiatric: She has a normal mood and affect. Her behavior is normal. Judgment and thought content normal.     Assessment/Plan This is a 22 year old female admitted for pyelonephritis. 1.  Pyelonephritis: Start Zosyn.  Manage nausea and vomiting symptomatically.  Hydrate with intravenous fluid. 2.  Sepsis: The patient meets criteria via fever and leukocytosis.  She is hemodynamically stable.  Follow blood cultures as well as urine cultures for growth and sensitivities. 3.  Hyperthyroidism: The patient does not carry this diagnosis.  TSH found to be low.  Obtain T4.  Follow-up thyroid exam for nodules. 4.  DVT prophylaxis: Lovenox 5.  GI prophylaxis: None The patient is a full code.  Time spent on admission orders and patient care approximately 45 minutes  Harrie Foreman, MD 06/27/2018, 6:30 AM

## 2018-06-28 LAB — CBC
HCT: 31.1 % — ABNORMAL LOW (ref 35.0–47.0)
Hemoglobin: 10.4 g/dL — ABNORMAL LOW (ref 12.0–16.0)
MCH: 26.9 pg (ref 26.0–34.0)
MCHC: 33.3 g/dL (ref 32.0–36.0)
MCV: 80.7 fL (ref 80.0–100.0)
PLATELETS: 158 10*3/uL (ref 150–440)
RBC: 3.86 MIL/uL (ref 3.80–5.20)
RDW: 14 % (ref 11.5–14.5)
WBC: 11.5 10*3/uL — AB (ref 3.6–11.0)

## 2018-06-28 LAB — BASIC METABOLIC PANEL
ANION GAP: 7 (ref 5–15)
BUN: 8 mg/dL (ref 6–20)
CALCIUM: 8 mg/dL — AB (ref 8.9–10.3)
CO2: 19 mmol/L — ABNORMAL LOW (ref 22–32)
Chloride: 110 mmol/L (ref 98–111)
Creatinine, Ser: 0.62 mg/dL (ref 0.44–1.00)
Glucose, Bld: 91 mg/dL (ref 70–99)
Potassium: 3.6 mmol/L (ref 3.5–5.1)
SODIUM: 136 mmol/L (ref 135–145)

## 2018-06-28 LAB — T4, FREE: FREE T4: 1.26 ng/dL (ref 0.82–1.77)

## 2018-06-28 LAB — T4: T4, Total: 7.4 ug/dL (ref 4.5–12.0)

## 2018-06-28 MED ORDER — FAMOTIDINE 20 MG PO TABS
20.0000 mg | ORAL_TABLET | Freq: Every day | ORAL | Status: DC
  Administered 2018-06-28 – 2018-06-29 (×2): 20 mg via ORAL
  Filled 2018-06-28 (×2): qty 1

## 2018-06-28 MED ORDER — ALUM & MAG HYDROXIDE-SIMETH 200-200-20 MG/5ML PO SUSP
30.0000 mL | ORAL | Status: DC | PRN
  Administered 2018-06-28: 30 mL via ORAL
  Filled 2018-06-28: qty 30

## 2018-06-28 NOTE — Progress Notes (Signed)
[facility] [place] NAME: Brady Plant    MR#:  [id number]  DATE OF BIRTH:  18-Oct-1996  SUBJECTIVE:  CHIEF COMPLAINT:  Pt is still febrile ,tired and reporting back pain but denies any nausea or vomiting.  Tolerating diet  REVIEW OF SYSTEMS:  CONSTITUTIONAL: No fever, fatigue or weakness.  EYES: No blurred or double vision.  EARS, NOSE, AND THROAT: No tinnitus or ear pain.  RESPIRATORY: No cough, shortness of breath, wheezing or hemoptysis.  CARDIOVASCULAR: No chest pain, orthopnea, edema.  GASTROINTESTINAL: No nausea, vomiting, diarrhea or abdominal pain.  Reporting back pain GENITOURINARY: No dysuria, hematuria.  ENDOCRINE: No polyuria, nocturia,  HEMATOLOGY: No anemia, easy bruising or bleeding SKIN: No rash or lesion. MUSCULOSKELETAL: No joint pain or arthritis.   NEUROLOGIC: No tingling, numbness, weakness.  PSYCHIATRY: No anxiety or depression.   DRUG ALLERGIES:  No Known Allergies  VITALS:  Blood pressure 116/69, pulse 97, temperature 99 F (37.2 C), temperature source Oral, resp. rate 18, height 5\' 2"  (1.575 m), weight 54.4 kg, last menstrual period 06/12/2018, SpO2 96 %, not currently breastfeeding.  PHYSICAL EXAMINATION:  GENERAL:  22 y.o.-year-old patient lying in the bed with no acute distress.  EYES: Pupils equal, round, reactive to light and accommodation. No scleral icterus. Extraocular muscles intact.  HEENT: Head atraumatic, normocephalic. Oropharynx and nasopharynx clear.  NECK:  Supple, no jugular venous distention. No thyroid enlargement, no tenderness.  LUNGS: Normal breath sounds bilaterally, no wheezing, rales,rhonchi or crepitation. No use of accessory muscles of respiration.  CARDIOVASCULAR: S1, S2 normal. No murmurs, rubs, or gallops.  ABDOMEN: Soft, nontender, nondistended. Bowel sounds present.  Positive flank tenderness EXTREMITIES: No pedal edema, cyanosis, or clubbing.  NEUROLOGIC: Cranial  nerves II through XII are intact. Muscle strength 5/5 in all extremities. Sensation intact. Gait not checked.  PSYCHIATRIC: The patient is alert and oriented x 3.  SKIN: No obvious rash, lesion, or ulcer.    LABORATORY PANEL:   CBC Recent Labs  Lab 06/28/18 0342  WBC 11.5*  HGB 10.4*  HCT 31.1*  PLT 158   ------------------------------------------------------------------------------------------------------------------  Chemistries  Recent Labs  Lab 06/26/18 1620 06/28/18 0342  NA 136 136  K 3.3* 3.6  CL 107 110  CO2 21* 19*  GLUCOSE 158* 91  BUN 13 8  CREATININE 0.76 0.62  CALCIUM 9.3 8.0*  AST 26  --   ALT 30  --   ALKPHOS 63  --   BILITOT 1.1  --    ------------------------------------------------------------------------------------------------------------------  Cardiac Enzymes No results for input(s): TROPONINI in the last 168 hours. ------------------------------------------------------------------------------------------------------------------  RADIOLOGY:  Ct Abdomen Pelvis W Contrast  Result Date: 06/27/2018 CLINICAL DATA:  Fever, abdominal pain, vomiting EXAM: CT ABDOMEN AND PELVIS WITH CONTRAST TECHNIQUE: Multidetector CT imaging of the abdomen and pelvis was performed using the standard protocol following bolus administration of intravenous contrast. CONTRAST:  19mL OMNIPAQUE IOHEXOL 300 MG/ML  SOLN COMPARISON:  Renal ultrasound 06/26/2018. FINDINGS: Lower chest: Dependent bibasilar atelectasis.  No effusions. Hepatobiliary: No focal hepatic abnormality. Gallbladder unremarkable. Pancreas: No focal abnormality or ductal dilatation. Spleen: No focal abnormality.  Normal size. Adrenals/Urinary Tract: Areas of decreased enhancement throughout the right kidney compatible with pyelonephritis. Mild perinephric stranding. No hydronephrosis. Left kidney, adrenal glands and urinary bladder unremarkable. Stomach/Bowel: Appendix is visualized and is normal. Stomach, large  and small bowel grossly unremarkable. Vascular/Lymphatic: No evidence of aneurysm or adenopathy. Reproductive: Uterus and adnexa unremarkable.  No mass. Other: No free fluid  or free air. Musculoskeletal: No acute bony abnormality. IMPRESSION: Areas of decreased enhancement throughout the right kidney compatible with pyelonephritis. Mild perinephric stranding. No hydronephrosis. Electronically Signed   By: Rolm Baptise M.D.   On: 06/27/2018 01:08   US Renal  Result Date: 06/26/2018 CLINICAL DATA:  RIGHT flank pain for 4 days EXAM: RENAL / URINARY TRACT ULTRASOUND COMPLETE COMPARISON:  None FINDINGS: Right Kidney: Length: 12.7 cm. Normal cortical thickness. Slightly increased cortical echogenicity. No mass, hydronephrosis or shadowing calcification. Left Kidney: Length: 11.2 cm. Normal cortical thickness. Normal cortical echogenicity. No mass, hydronephrosis or shadowing calcification. Bladder: Partially distended, grossly unremarkable. IMPRESSION: No evidence of renal mass or hydronephrosis. Slightly increased RIGHT renal cortical echogenicity, nonspecific and of uncertain etiology, can be seen with medical renal disease changes but LEFT kidney does not demonstrate similar findings. Electronically Signed   By: Lavonia Dana M.D.   On: 06/26/2018 18:11   US Abdomen Limited Ruq  Result Date: 06/26/2018 CLINICAL DATA:  RIGHT upper abdominal pain/flank pain for 4 days. EXAM: ULTRASOUND ABDOMEN LIMITED RIGHT UPPER QUADRANT COMPARISON:  None. FINDINGS: Gallbladder: No gallstones or wall thickening visualized. No sonographic Murphy sign noted by sonographer. Common bile duct: Diameter: 2 mm. Liver: No focal lesion identified. Within normal limits in parenchymal echogenicity. Portal vein is patent on color Doppler imaging with normal direction of blood flow towards the liver. Mildly echogenic RIGHT kidney. IMPRESSION: 1. Mildly echogenic RIGHT kidney, please see renal ultrasound from same day, reported separately. 2.  Otherwise negative RIGHT upper quadrant ultrasound. Electronically Signed   By: Elon Alas M.D.   On: 06/26/2018 18:11    EKG:  No orders found for this or any previous visit.  ASSESSMENT AND PLAN:   This is a 22 year old female admitted for pyelonephritis. 1.  Pyelonephritis:  Patient is still febrile D/c  Zosyn and start iv rocephin  de-escalate antibiotics based on the culture results  Manage nausea and vomiting symptomatically.   Hydrate with intravenous fluid. Renal ultrasound with no hydronephrosis  2.  Sepsis: The patient meets criteria via fever and leukocytosis.  She is hemodynamically stable.  Follow blood cultures as well as urine cultures for growth and sensitivities.  3.  Hyperthyroidism: The patient does not carry this diagnosis.  TSH found to be low.    Free T4 and T3 are in the normal range Outpatient follow-up for thyroid exam for nodules.      DVT prophylaxis: Lovenox   GI prophylaxis: None   The patient is a full code.    All the records are reviewed and case discussed with Care Management/Social Workerr. Management plans discussed with the patient, family and they are in agreement.  CODE STATUS: fc  TOTAL TIME TAKING CARE OF THIS PATIENT: 53minutes.   POSSIBLE D/C IN 1-2 DAYS, DEPENDING ON CLINICAL CONDITION.  Note: This dictation was prepared with Dragon dictation along with smaller phrase technology. Any transcriptional errors that result from this process are unintentional.   Nicholes Mango M.D on 06/28/2018 at 12:12 PM  Between 7am to 6pm - Pager - [phone] After 6pm go to [email or web address] - password [email or web address] [facility]  Office  [phone]  CC: Primary care physician; Patient, No Pcp Per

## 2018-06-29 LAB — CBC
HCT: 33.7 % — ABNORMAL LOW (ref 35.0–47.0)
HEMOGLOBIN: 11.2 g/dL — AB (ref 12.0–16.0)
MCH: 26.8 pg (ref 26.0–34.0)
MCHC: 33.3 g/dL (ref 32.0–36.0)
MCV: 80.4 fL (ref 80.0–100.0)
Platelets: 183 10*3/uL (ref 150–440)
RBC: 4.19 MIL/uL (ref 3.80–5.20)
RDW: 13.9 % (ref 11.5–14.5)
WBC: 8.2 10*3/uL (ref 3.6–11.0)

## 2018-06-29 LAB — URINE CULTURE: Special Requests: NORMAL

## 2018-06-29 LAB — T3: T3 TOTAL: 68 ng/dL — AB (ref 71–180)

## 2018-06-29 MED ORDER — CEFUROXIME AXETIL 500 MG PO TABS: 500.0000 mg | ORAL_TABLET | Freq: Two times a day (BID) | ORAL | 0 refills | Status: AC

## 2018-06-29 MED ORDER — HYDROCODONE-ACETAMINOPHEN 5-325 MG PO TABS: 1.0000 | ORAL_TABLET | ORAL | 0 refills | Status: DC | PRN

## 2018-06-29 NOTE — Discharge Summary (Signed)
[place] [facility], 22 y.o., DOB 06-02-1996, MRN [id number]. Admission date: 06/26/2018 Discharge Date 06/29/2018 Primary MD Patient, No Pcp Per Admitting Physician Harrie Foreman, MD  Admission Diagnosis  Pyelonephritis, acute [N10]  Discharge Diagnosis   Active Problems: Acute pyelonephritis Sepsis due to pyelonephritis Abnormal thyroid function tests with normal free T3 and free T4 can follow-up outpatient     Hospital Course Patient is 22 year old presented with flank pain ongoing for 4 days she also had fevers chills.  She had a CT scan of the abdomen which showed findings consistent with pyelonephritis.  Patient was admitted and started on IV antibiotics.  Now her fevers are resolved WBC count is normalized.  Urine culture showed multiple bacteria.  However her clinical presentation was consistent with acute pyelonephritis.  Her TSH was low however free T3 and free T4 were normal.  She needs outpatient follow-up for this.           Consults  None  Significant Tests:  See full reports for all details     Ct Abdomen Pelvis W Contrast  Result Date: 06/27/2018 CLINICAL DATA:  Fever, abdominal pain, vomiting EXAM: CT ABDOMEN AND PELVIS WITH CONTRAST TECHNIQUE: Multidetector CT imaging of the abdomen and pelvis was performed using the standard protocol following bolus administration of intravenous contrast. CONTRAST:  15mL OMNIPAQUE IOHEXOL 300 MG/ML  SOLN COMPARISON:  Renal ultrasound 06/26/2018. FINDINGS: Lower chest: Dependent bibasilar atelectasis.  No effusions. Hepatobiliary: No focal hepatic abnormality. Gallbladder unremarkable. Pancreas: No focal abnormality or ductal dilatation. Spleen: No focal abnormality.  Normal size. Adrenals/Urinary Tract: Areas of decreased enhancement throughout the right kidney compatible with pyelonephritis. Mild perinephric stranding. No hydronephrosis. Left kidney, adrenal glands and urinary  bladder unremarkable. Stomach/Bowel: Appendix is visualized and is normal. Stomach, large and small bowel grossly unremarkable. Vascular/Lymphatic: No evidence of aneurysm or adenopathy. Reproductive: Uterus and adnexa unremarkable.  No mass. Other: No free fluid or free air. Musculoskeletal: No acute bony abnormality. IMPRESSION: Areas of decreased enhancement throughout the right kidney compatible with pyelonephritis. Mild perinephric stranding. No hydronephrosis. Electronically Signed   By: Rolm Baptise M.D.   On: 06/27/2018 01:08   US Renal  Result Date: 06/26/2018 CLINICAL DATA:  RIGHT flank pain for 4 days EXAM: RENAL / URINARY TRACT ULTRASOUND COMPLETE COMPARISON:  None FINDINGS: Right Kidney: Length: 12.7 cm. Normal cortical thickness. Slightly increased cortical echogenicity. No mass, hydronephrosis or shadowing calcification. Left Kidney: Length: 11.2 cm. Normal cortical thickness. Normal cortical echogenicity. No mass, hydronephrosis or shadowing calcification. Bladder: Partially distended, grossly unremarkable. IMPRESSION: No evidence of renal mass or hydronephrosis. Slightly increased RIGHT renal cortical echogenicity, nonspecific and of uncertain etiology, can be seen with medical renal disease changes but LEFT kidney does not demonstrate similar findings. Electronically Signed   By: Lavonia Dana M.D.   On: 06/26/2018 18:11   US Abdomen Limited Ruq  Result Date: 06/26/2018 CLINICAL DATA:  RIGHT upper abdominal pain/flank pain for 4 days. EXAM: ULTRASOUND ABDOMEN LIMITED RIGHT UPPER QUADRANT COMPARISON:  None. FINDINGS: Gallbladder: No gallstones or wall thickening visualized. No sonographic Murphy sign noted by sonographer. Common bile duct: Diameter: 2 mm. Liver: No focal lesion identified. Within normal limits in parenchymal echogenicity. Portal vein is patent on color Doppler imaging with normal direction of blood flow towards the liver. Mildly echogenic RIGHT kidney. IMPRESSION: 1. Mildly  echogenic RIGHT kidney, please see renal ultrasound from same day, reported separately. 2. Otherwise negative RIGHT upper quadrant ultrasound. Electronically  Signed   By: Elon Alas M.D.   On: 06/26/2018 18:11       Today   Subjective:   Kathleen Glass patient currently denies any symptoms  Objective:   Blood pressure (!) 157/98, pulse 63, temperature 99.5 F (37.5 C), temperature source Oral, resp. rate 20, height 5\' 2"  (1.575 m), weight 56.7 kg, last menstrual period 06/12/2018, SpO2 99 %, not currently breastfeeding.  .  Intake/Output Summary (Last 24 hours) at 06/29/2018 1440 Last data filed at 06/29/2018 0300 Gross per 24 hour  Intake 1063.51 ml  Output 300 ml  Net 763.51 ml    Exam VITAL SIGNS: Blood pressure (!) 157/98, pulse 63, temperature 99.5 F (37.5 C), temperature source Oral, resp. rate 20, height 5\' 2"  (1.575 m), weight 56.7 kg, last menstrual period 06/12/2018, SpO2 99 %, not currently breastfeeding.  GENERAL:  22 y.o.-year-old patient lying in the bed lying in the bed with no acute distress.  EYES: Pupils equal, round, reactive to light and accommodation. No scleral icterus. Extraocular muscles intact.  HEENT: Head atraumatic, normocephalic. Oropharynx and nasopharynx clear.  NECK:  Supple, no jugular venous distention. No thyroid enlargement, no tenderness.  LUNGS: Normal breath sounds bilaterally, no wheezing, rales,rhonchi or crepitation. No use of accessory muscles of respiration.  CARDIOVASCULAR: S1, S2 normal. No murmurs, rubs, or gallops.  ABDOMEN: Soft, nontender, nondistended. Bowel sounds present. No organomegaly or mass.  EXTREMITIES: No pedal edema, cyanosis, or clubbing.  NEUROLOGIC: Cranial nerves II through XII are intact. Muscle strength 5/5 in all extremities. Sensation intact. Gait not checked.  PSYCHIATRIC: The patient is alert and oriented x 3.  SKIN: No obvious rash, lesion, or ulcer.   Data Review     CBC w Diff:  Lab Results  Component Value  Date   WBC 8.2 06/29/2018   HGB 11.2 (L) 06/29/2018   HGB 10.7 (L) 07/08/2017   HCT 33.7 (L) 06/29/2018   HCT 34.6 07/08/2017   PLT 183 06/29/2018   PLT 294 07/08/2017   CMP:  Lab Results  Component Value Date   NA 136 06/28/2018   K 3.6 06/28/2018   CL 110 06/28/2018   CO2 19 (L) 06/28/2018   BUN 8 06/28/2018   CREATININE 0.62 06/28/2018   PROT 8.1 06/26/2018   ALBUMIN 4.1 06/26/2018   BILITOT 1.1 06/26/2018   ALKPHOS 63 06/26/2018   AST 26 06/26/2018   ALT 30 06/26/2018  .  Micro Results Recent Results (from the past 240 hour(s))  Urine Culture     Status: Abnormal   Collection Time: 06/26/18  4:21 PM  Result Value Ref Range Status   Specimen Description   Final    URINE, CLEAN CATCH Performed at [facility], [address] [place], [place] [address]    Special Requests   Final    Normal Performed at [facility], [address] [place], [place] [address]    Culture MULTIPLE SPECIES PRESENT, SUGGEST RECOLLECTION (A)  Final   Report Status 06/29/2018 FINAL  Final  Blood culture (routine x 2)     Status: None (Preliminary result)   Collection Time: 06/26/18 11:34 PM  Result Value Ref Range Status   Specimen Description BLOOD LEFT ASSIST CONTROL  Final   Special Requests   Final    BOTTLES DRAWN AEROBIC AND ANAEROBIC Blood Culture adequate volume   Culture   Final    NO GROWTH 3 DAYS Performed at [facility], [address] [place],  [address]    Report  Status PENDING  Incomplete  Blood culture (routine x 2)     Status: None (Preliminary result)   Collection Time: 06/26/18 11:34 PM  Result Value Ref Range Status   Specimen Description BLOOD RIGHT FATTY CASTS  Final   Special Requests   Final    BOTTLES DRAWN AEROBIC AND ANAEROBIC Blood Culture adequate volume   Culture   Final    NO GROWTH 3 DAYS Performed at [facility], [address] [place], [place] [address]    Report Status PENDING  Incomplete         Code Status Orders  (From admission, onward)         Start     Ordered   06/27/18 0351  Full code  Continuous     06/27/18 0350        Code Status History    Date Active Date Inactive Code Status Order ID Comments User Context   10/07/2017 2058 10/09/2017 2040 Full Code [id number]  Diona Fanti, [facility] Inpatient   10/07/2017 0235 10/07/2017 2058 Full Code [id number]  Jarrett Soho, RN Inpatient   10/03/2017 2051 10/04/2017 0114 Full Code [id number]  Christiane Ha, RN Inpatient   10/03/2017 1024 10/03/2017 1510 Full Code [id number]  Philip [facility]en, CNM Inpatient          Follow-up Information    pcp Follow up in 1 week(s).           Discharge Medications   Allergies as of 06/29/2018   No Known Allergies     Medication List    STOP taking these medications   cephALEXin 500 MG capsule Commonly known as:  KEFLEX   ibuprofen 600 MG tablet Commonly known as:  ADVIL,MOTRIN   promethazine 12.5 MG tablet Commonly known as:  PHENERGAN     TAKE these medications   acetaminophen 500 MG tablet Commonly known as:  TYLENOL Take 500 mg by mouth every 6 (six) hours as needed for mild pain, fever or headache.   cefUROXime 500 MG tablet Commonly known as:  CEFTIN Take 1 tablet (500 mg total) by mouth 2 (two) times daily for 6 days.   HYDROcodone-acetaminophen 5-325 MG tablet Commonly known as:  NORCO/VICODIN Take 1 tablet by mouth every 4 (four) hours as needed for moderate pain.          Total Time in preparing paper work, data evaluation and todays exam - 34 minutes  Dustin Flock M.D on 06/29/2018 at 2:40 PM [place]  [phone]

## 2018-06-29 NOTE — Progress Notes (Signed)
[place] [facility] [place] was admitted to the Hospital on 06/26/2018 and Discharged  06/29/2018 and should be excused from work/school   for 8 days starting 06/26/2018 , may return to work/school without any restrictions.  Call Dustin Flock MD with questions.  Dustin Flock M.D on 06/29/2018,at 11:01 AM  [place] [facility]  [phone]

## 2018-07-01 LAB — CULTURE, BLOOD (ROUTINE X 2)
CULTURE: NO GROWTH
Culture: NO GROWTH
SPECIAL REQUESTS: ADEQUATE
SPECIAL REQUESTS: ADEQUATE

## 2019-01-04 ENCOUNTER — Encounter: Payer: Self-pay | Admitting: Certified Nurse Midwife

## 2019-01-04 ENCOUNTER — Ambulatory Visit (INDEPENDENT_AMBULATORY_CARE_PROVIDER_SITE_OTHER): Payer: Medicaid Other | Admitting: Certified Nurse Midwife

## 2019-01-04 VITALS — BP 110/58 | HR 65 | Ht 62.0 in | Wt 124.0 lb

## 2019-01-04 DIAGNOSIS — N921 Excessive and frequent menstruation with irregular cycle: Secondary | ICD-10-CM

## 2019-01-04 DIAGNOSIS — F32A Depression, unspecified: Secondary | ICD-10-CM

## 2019-01-04 DIAGNOSIS — F419 Anxiety disorder, unspecified: Secondary | ICD-10-CM

## 2019-01-04 DIAGNOSIS — F329 Major depressive disorder, single episode, unspecified: Secondary | ICD-10-CM

## 2019-01-04 DIAGNOSIS — Z975 Presence of (intrauterine) contraceptive device: Secondary | ICD-10-CM

## 2019-01-04 MED ORDER — SERTRALINE HCL 50 MG PO TABS: 50.0000 mg | ORAL_TABLET | Freq: Every day | ORAL | 1 refills | Status: DC

## 2019-01-04 NOTE — Patient Instructions (Addendum)
WE WOULD LOVE TO HEAR FROM YOU!!!!   Thank you Kathleen Glass for visiting [facility].  Providing our patients with the best experience possible is really important to [place], and we hope that you felt that on your recent visit. The most valuable feedback we get comes from Kathleen Glass!!    If you receive a survey please take a couple of minutes to let us know how we did.Thank you for continuing to trust [place] with your care.   [facility]   Kathleen Glass intrauterine device (IUD) What is this medicine? Kathleen Glass IUD (LEE voe nor jes trel) is a contraceptive (birth control) device. The device is placed inside the uterus by a healthcare professional. It is used to prevent pregnancy. This device can also be used to treat heavy bleeding that occurs during your period. This medicine may be used for other purposes; ask your health care provider or pharmacist if you have questions. COMMON BRAND NAME(S): Minette Headland What should I tell my health care provider before I take this medicine? They need to know if you have any of these conditions: -abnormal Pap smear -cancer of the breast, uterus, or cervix -diabetes -endometritis -genital or pelvic infection now or in the past -have more than one sexual partner or your partner has more than one partner -heart disease -history of an ectopic or tubal pregnancy -immune system problems -IUD in place -liver disease or tumor -problems with blood clots or take blood-thinners -seizures -use intravenous drugs -uterus of unusual shape -vaginal bleeding that has not been explained -an unusual or allergic reaction to Kathleen Glass, other hormones, silicone, or polyethylene, medicines, foods, dyes, or preservatives -pregnant or trying to get pregnant -breast-feeding How should I use this medicine? This device is placed inside the uterus by a health care professional. Talk to your pediatrician regarding  the use of this medicine in children. Special care may be needed. Overdosage: If you think you have taken too much of this medicine contact a poison control center or emergency room at once. NOTE: This medicine is only for you. Do not share this medicine with others. What if I miss a dose? This does not apply. Depending on the brand of device you have inserted, the device will need to be replaced every 3 to 5 years if you wish to continue using this type of birth control. What may interact with this medicine? Do not take this medicine with any of the following medications: -amprenavir -bosentan -fosamprenavir This medicine may also interact with the following medications: -aprepitant -armodafinil -barbiturate medicines for inducing sleep or treating seizures -bexarotene -boceprevir -griseofulvin -medicines to treat seizures like carbamazepine, ethotoin, felbamate, oxcarbazepine, phenytoin, topiramate -modafinil -pioglitazone -rifabutin -rifampin -rifapentine -some medicines to treat HIV infection like atazanavir, efavirenz, indinavir, lopinavir, nelfinavir, tipranavir, ritonavir -St. John's wort -warfarin This list may not describe all possible interactions. Give your health care provider a list of all the medicines, herbs, non-prescription drugs, or dietary supplements you use. Also tell them if you smoke, drink alcohol, or use illegal drugs. Some items may interact with your medicine. What should I watch for while using this medicine? Visit your doctor or health care professional for regular check ups. See your doctor if you or your partner has sexual contact with others, becomes HIV positive, or gets a sexual transmitted disease. This product does not protect you against HIV infection (AIDS) or other sexually transmitted diseases. You can check the placement of the IUD yourself by reaching up to the top  of your vagina with clean fingers to feel the threads. Do not pull on the  threads. It is a good habit to check placement after each menstrual period. Call your doctor right away if you feel more of the IUD than just the threads or if you cannot feel the threads at all. The IUD may come out by itself. You may become pregnant if the device comes out. If you notice that the IUD has come out use a backup birth control method like condoms and call your health care provider. Using tampons will not change the position of the IUD and are okay to use during your period. This IUD can be safely scanned with magnetic resonance imaging (MRI) only under specific conditions. Before you have an MRI, tell your healthcare provider that you have an IUD in place, and which type of IUD you have in place. What side effects may I notice from receiving this medicine? Side effects that you should report to your doctor or health care professional as soon as possible: -allergic reactions like skin rash, itching or hives, swelling of the face, lips, or tongue -fever, flu-like symptoms -genital sores -high blood pressure -no menstrual period for 6 weeks during use -pain, swelling, warmth in the leg -pelvic pain or tenderness -severe or sudden headache -signs of pregnancy -stomach cramping -sudden shortness of breath -trouble with balance, talking, or walking -unusual vaginal bleeding, discharge -yellowing of the eyes or skin Side effects that usually do not require medical attention (report to your doctor or health care professional if they continue or are bothersome): -acne -breast pain -change in sex drive or performance -changes in weight -cramping, dizziness, or faintness while the device is being inserted -headache -irregular menstrual bleeding within first 3 to 6 months of use -nausea This list may not describe all possible side effects. Call your doctor for medical advice about side effects. You may report side effects to FDA at 1-800-FDA-1088. Where should I keep my  medicine? This does not apply. NOTE: This sheet is a summary. It may not cover all possible information. If you have questions about this medicine, talk to your doctor, pharmacist, or health care provider.  2019 Elsevier/Gold Standard (2016-07-30 14:14:56)  Sertraline tablets What is this medicine? SERTRALINE (SER tra leen) is used to treat depression. It may also be used to treat obsessive compulsive disorder, panic disorder, post-trauma stress, premenstrual dysphoric disorder (PMDD) or social anxiety. This medicine may be used for other purposes; ask your health care provider or pharmacist if you have questions. COMMON BRAND NAME(S): Zoloft What should I tell my health care provider before I take this medicine? They need to know if you have any of these conditions: -bleeding disorders -bipolar disorder or a family history of bipolar disorder -glaucoma -heart disease -high blood pressure -history of irregular heartbeat -history of low levels of calcium, magnesium, or potassium in the blood -if you often drink alcohol -liver disease -receiving electroconvulsive therapy -seizures -suicidal thoughts, plans, or attempt; a previous suicide attempt by you or a family member -take medicines that treat or prevent blood clots -thyroid disease -an unusual or allergic reaction to sertraline, other medicines, foods, dyes, or preservatives -pregnant or trying to get pregnant -breast-feeding How should I use this medicine? Take this medicine by mouth with a glass of water. Follow the directions on the prescription label. You can take it with or without food. Take your medicine at regular intervals. Do not take your medicine more often than directed. Do not  stop taking this medicine suddenly except upon the advice of your doctor. Stopping this medicine too quickly may cause serious side effects or your condition may worsen. A special MedGuide will be given to you by the pharmacist with each  prescription and refill. Be sure to read this information carefully each time. Talk to your pediatrician regarding the use of this medicine in children. While this drug may be prescribed for children as young as 7 years for selected conditions, precautions do apply. Overdosage: If you think you have taken too much of this medicine contact a poison control center or emergency room at once. NOTE: This medicine is only for you. Do not share this medicine with others. What if I miss a dose? If you miss a dose, take it as soon as you can. If it is almost time for your next dose, take only that dose. Do not take double or extra doses. What may interact with this medicine? Do not take this medicine with any of the following medications: -cisapride -dofetilide -dronedarone -linezolid -MAOIs like Carbex, Eldepryl, Marplan, Nardil, and Parnate -methylene blue (injected into a vein) -pimozide -thioridazine This medicine may also interact with the following medications: -alcohol -amphetamines -aspirin and aspirin-like medicines -certain medicines for depression, anxiety, or psychotic disturbances -certain medicines for fungal infections like ketoconazole, fluconazole, posaconazole, and itraconazole -certain medicines for irregular heart beat like flecainide, quinidine, propafenone -certain medicines for migraine headaches like almotriptan, eletriptan, frovatriptan, naratriptan, rizatriptan, sumatriptan, zolmitriptan -certain medicines for sleep -certain medicines for seizures like carbamazepine, valproic acid, phenytoin -certain medicines that treat or prevent blood clots like warfarin, enoxaparin, dalteparin -cimetidine -digoxin -diuretics -fentanyl -isoniazid -lithium -NSAIDs, medicines for pain and inflammation, like ibuprofen or naproxen -other medicines that prolong the QT interval (cause an abnormal heart rhythm) -rasagiline -safinamide -supplements like St. John's wort, kava kava,  valerian -tolbutamide -tramadol -tryptophan This list may not describe all possible interactions. Give your health care provider a list of all the medicines, herbs, non-prescription drugs, or dietary supplements you use. Also tell them if you smoke, drink alcohol, or use illegal drugs. Some items may interact with your medicine. What should I watch for while using this medicine? Tell your doctor if your symptoms do not get better or if they get worse. Visit your doctor or health care professional for regular checks on your progress. Because it may take several weeks to see the full effects of this medicine, it is important to continue your treatment as prescribed by your doctor. Patients and their families should watch out for new or worsening thoughts of suicide or depression. Also watch out for sudden changes in feelings such as feeling anxious, agitated, panicky, irritable, hostile, aggressive, impulsive, severely restless, overly excited and hyperactive, or not being able to sleep. If this happens, especially at the beginning of treatment or after a change in dose, call your health care professional. Kathleen Glass may get drowsy or dizzy. Do not drive, use machinery, or do anything that needs mental alertness until you know how this medicine affects you. Do not stand or sit up quickly, especially if you are an older patient. This reduces the risk of dizzy or fainting spells. Alcohol may interfere with the effect of this medicine. Avoid alcoholic drinks. Your mouth may get dry. Chewing sugarless gum or sucking hard candy, and drinking plenty of water may help. Contact your doctor if the problem does not go away or is severe. What side effects may I notice from receiving this medicine? Side effects that  you should report to your doctor or health care professional as soon as possible: -allergic reactions like skin rash, itching or hives, swelling of the face, lips, or tongue -anxious -black, tarry  stools -changes in vision -confusion -elevated mood, decreased need for sleep, racing thoughts, impulsive behavior -eye pain -fast, irregular heartbeat -feeling faint or lightheaded, falls -feeling agitated, angry, or irritable -hallucination, loss of contact with reality -loss of balance or coordination -loss of memory -painful or prolonged erections -restlessness, pacing, inability to keep still -seizures -stiff muscles -suicidal thoughts or other mood changes -trouble sleeping -unusual bleeding or bruising -unusually weak or tired -vomiting Side effects that usually do not require medical attention (report to your doctor or health care professional if they continue or are bothersome): -change in appetite or weight -change in sex drive or performance -diarrhea -increased sweating -indigestion, nausea -tremors This list may not describe all possible side effects. Call your doctor for medical advice about side effects. You may report side effects to FDA at 1-800-FDA-1088. Where should I keep my medicine? Keep out of the reach of children. Store at room temperature between 15 and 30 degrees C (59 and 86 degrees F). Throw away any unused medicine after the expiration date. NOTE: This sheet is a summary. It may not cover all possible information. If you have questions about this medicine, talk to your doctor, pharmacist, or health care provider.  2019 Elsevier/Gold Standard (2016-10-22 14:17:49)  Major Depressive Disorder, Adult Major depressive disorder (MDD) is a mental health condition. MDD often makes you feel sad, hopeless, or helpless. MDD can also cause symptoms in your body. MDD can affect your:  Work.  School.  Relationships.  Other normal activities. MDD can range from mild to very bad. It may occur once (single episode MDD). It can also occur many times (recurrent MDD). The main symptoms of MDD often include:  Feeling sad, depressed, or irritable most of the  time.  Loss of interest. MDD symptoms also include:  Sleeping too much or too little.  Eating too much or too little.  A change in your weight.  Feeling tired (fatigue) or having low energy.  Feeling worthless.  Feeling guilty.  Trouble making decisions.  Trouble thinking clearly.  Thoughts of suicide or harming others.  Feeling weak.  Feeling agitated.  Keeping yourself from being around other people (isolation). Follow these instructions at home: Activity  Do these things as told by your doctor: ? Go back to your normal activities. ? Exercise regularly. ? Spend time outdoors. Alcohol  Talk with your doctor about how alcohol can affect your antidepressant medicines.  Do not drink alcohol. Or, limit how much alcohol you drink. ? This means no more than 1 drink a day for nonpregnant women and 2 drinks a day for men. One drink equals one of these:  12 oz of beer.  5 oz of wine.  1 oz of hard liquor. General instructions  Take over-the-counter and prescription medicines only as told by your doctor.  Eat a healthy diet.  Get plenty of sleep.  Find activities that you enjoy. Make time to do them.  Think about joining a support group. Your doctor may be able to suggest a group for you.  Keep all follow-up visits as told by your doctor. This is important. Where to find more information:  [employer] on Mental Illness: ? www.nami.[place]: ? https://carter.com/  National Suicide Prevention Lifeline: ? 229-617-6263. This is free, 24-hour  help. Contact a doctor if:  Your symptoms get worse.  You have new symptoms. Get help right away if:  You self-harm.  You see, hear, taste, smell, or feel things that are not present (hallucinate). If you ever feel like you may hurt yourself or others, or have thoughts about taking your own life, get help right away. You can go to your nearest emergency department or  call:  Your local emergency services (911 in the [place]  A suicide crisis helpline, such as the National Suicide Prevention Lifeline: ? 671-416-7821. This is open 24 hours a day. This information is not intended to replace advice given to you by your health care provider. Make sure you discuss any questions you have with your health care provider. Document Released: 09/29/2015 Document Revised: 07/04/2016 Document Reviewed: 07/04/2016 Elsevier Interactive Patient Education  2019 [place]. Generalized Anxiety Disorder, Adult Generalized anxiety disorder (GAD) is a mental health disorder. People with this condition constantly worry about everyday events. Unlike normal anxiety, worry related to GAD is not triggered by a specific event. These worries also do not fade or get better with time. GAD interferes with life functions, including relationships, work, and school. GAD can vary from mild to severe. People with severe GAD can have intense waves of anxiety with physical symptoms (panic attacks). What are the causes? The exact cause of GAD is not known. What increases the risk? This condition is more likely to develop in:  Women.  People who have a family history of anxiety disorders.  People who are very shy.  People who experience very stressful life events, such as the death of a loved one.  People who have a very stressful family environment. What are the signs or symptoms? People with GAD often worry excessively about many things in their lives, such as their health and family. They may also be overly concerned about:  Doing well at work.  Being on time.  Natural disasters.  Friendships. Physical symptoms of GAD include:  Fatigue.  Muscle tension or having muscle twitches.  Trembling or feeling shaky.  Being easily startled.  Feeling like your heart is pounding or racing.  Feeling out of breath or like you cannot take a deep breath.  Having trouble falling  asleep or staying asleep.  Sweating.  Nausea, diarrhea, or irritable bowel syndrome (IBS).  Headaches.  Trouble concentrating or remembering facts.  Restlessness.  Irritability. How is this diagnosed? Your health care provider can diagnose GAD based on your symptoms and medical history. You will also have a physical exam. The health care provider will ask specific questions about your symptoms, including how severe they are, when they started, and if they come and go. Your health care provider may ask you about your use of alcohol or drugs, including prescription medicines. Your health care provider may refer you to a mental health specialist for further evaluation. Your health care provider will do a thorough examination and may perform additional tests to rule out other possible causes of your symptoms. To be diagnosed with GAD, a person must have anxiety that:  Is out of his or her control.  Affects several different aspects of his or her life, such as work and relationships.  Causes distress that makes him or her unable to take part in normal activities.  Includes at least three physical symptoms of GAD, such as restlessness, fatigue, trouble concentrating, irritability, muscle tension, or sleep problems. Before your health care provider can confirm a diagnosis of GAD,  these symptoms must be present more days than they are not, and they must last for six months or longer. How is this treated? The following therapies are usually used to treat GAD:  Medicine. Antidepressant medicine is usually prescribed for long-term daily control. Antianxiety medicines may be added in severe cases, especially when panic attacks occur.  Talk therapy (psychotherapy). Certain types of talk therapy can be helpful in treating GAD by providing support, education, and guidance. Options include: ? Cognitive behavioral therapy (CBT). People learn coping skills and techniques to ease their anxiety. They  learn to identify unrealistic or negative thoughts and behaviors and to replace them with positive ones. ? Acceptance and commitment therapy (ACT). This treatment teaches people how to be mindful as a way to cope with unwanted thoughts and feelings. ? Biofeedback. This process trains you to manage your body's response (physiological response) through breathing techniques and relaxation methods. You will work with a therapist while machines are used to monitor your physical symptoms.  Stress management techniques. These include yoga, meditation, and exercise. A mental health specialist can help determine which treatment is best for you. Some people see improvement with one type of therapy. However, other people require a combination of therapies. Follow these instructions at home:  Take over-the-counter and prescription medicines only as told by your health care provider.  Try to maintain a normal routine.  Try to anticipate stressful situations and allow extra time to manage them.  Practice any stress management or self-calming techniques as taught by your health care provider.  Do not punish yourself for setbacks or for not making progress.  Try to recognize your accomplishments, even if they are small.  Keep all follow-up visits as told by your health care provider. This is important. Contact a health care provider if:  Your symptoms do not get better.  Your symptoms get worse.  You have signs of depression, such as: ? A persistently sad, cranky, or irritable mood. ? Loss of enjoyment in activities that used to bring you joy. ? Change in weight or eating. ? Changes in sleeping habits. ? Avoiding friends or family members. ? Loss of energy for normal tasks. ? Feelings of guilt or worthlessness. Get help right away if:  You have serious thoughts about hurting yourself or others. If you ever feel like you may hurt yourself or others, or have thoughts about taking your own life,  get help right away. You can go to your nearest emergency department or call:  Your local emergency services (911 in the [place]  A suicide crisis helpline, such as the [place] at 3127734923. This is open 24 hours a day. Summary  Generalized anxiety disorder (GAD) is a mental health disorder that involves worry that is not triggered by a specific event.  People with GAD often worry excessively about many things in their lives, such as their health and family.  GAD may cause physical symptoms such as restlessness, trouble concentrating, sleep problems, frequent sweating, nausea, diarrhea, headaches, and trembling or muscle twitching.  A mental health specialist can help determine which treatment is best for you. Some people see improvement with one type of therapy. However, other people require a combination of therapies. This information is not intended to replace advice given to you by your health care provider. Make sure you discuss any questions you have with your health care provider. Document Released: 02/12/2013 Document Revised: 09/07/2016 Document Reviewed: 09/07/2016 Elsevier Interactive Patient Education  2019 [employer].
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
## 2019-01-05 LAB — CBC
HEMATOCRIT: 40.2 % (ref 34.0–46.6)
HEMOGLOBIN: 12.7 g/dL (ref 11.1–15.9)
MCH: 26.2 pg — ABNORMAL LOW (ref 26.6–33.0)
MCHC: 31.6 g/dL (ref 31.5–35.7)
MCV: 83 fL (ref 79–97)
Platelets: 303 10*3/uL (ref 150–450)
RBC: 4.85 x10E6/uL (ref 3.77–5.28)
RDW: 13.9 % (ref 11.7–15.4)
WBC: 9.8 10*3/uL (ref 3.4–10.8)

## 2019-06-03 ENCOUNTER — Emergency Department
Admission: EM | Admit: 2019-06-03 | Discharge: 2019-06-03 | Disposition: A | Discharge status: Home / Self Care | Payer: Medicaid Other | Source: Home / Self Care | Attending: Emergency Medicine | Admitting: Emergency Medicine

## 2019-06-03 ENCOUNTER — Inpatient Hospital Stay
Admission: EM | Admit: 2019-06-03 | Discharge: 2019-06-07 | DRG: 872 | Disposition: A | Discharge status: Home / Self Care | Payer: Medicaid Other | Attending: Internal Medicine | Admitting: Internal Medicine

## 2019-06-03 ENCOUNTER — Emergency Department: Payer: Medicaid Other

## 2019-06-03 ENCOUNTER — Other Ambulatory Visit: Payer: Self-pay

## 2019-06-03 DIAGNOSIS — Z793 Long term (current) use of hormonal contraceptives: Secondary | ICD-10-CM

## 2019-06-03 DIAGNOSIS — Z20828 Contact with and (suspected) exposure to other viral communicable diseases: Secondary | ICD-10-CM | POA: Diagnosis present

## 2019-06-03 DIAGNOSIS — N10 Acute tubulo-interstitial nephritis: Secondary | ICD-10-CM | POA: Diagnosis present

## 2019-06-03 DIAGNOSIS — Z87440 Personal history of urinary (tract) infections: Secondary | ICD-10-CM

## 2019-06-03 DIAGNOSIS — K5900 Constipation, unspecified: Secondary | ICD-10-CM | POA: Diagnosis present

## 2019-06-03 DIAGNOSIS — A4151 Sepsis due to Escherichia coli [E. coli]: Secondary | ICD-10-CM | POA: Diagnosis not present

## 2019-06-03 DIAGNOSIS — Z79899 Other long term (current) drug therapy: Secondary | ICD-10-CM

## 2019-06-03 DIAGNOSIS — F329 Major depressive disorder, single episode, unspecified: Secondary | ICD-10-CM | POA: Diagnosis present

## 2019-06-03 DIAGNOSIS — Z803 Family history of malignant neoplasm of breast: Secondary | ICD-10-CM

## 2019-06-03 DIAGNOSIS — Z87891 Personal history of nicotine dependence: Secondary | ICD-10-CM

## 2019-06-03 DIAGNOSIS — A419 Sepsis, unspecified organism: Secondary | ICD-10-CM | POA: Diagnosis present

## 2019-06-03 DIAGNOSIS — N12 Tubulo-interstitial nephritis, not specified as acute or chronic: Secondary | ICD-10-CM

## 2019-06-03 DIAGNOSIS — Z79891 Long term (current) use of opiate analgesic: Secondary | ICD-10-CM | POA: Diagnosis not present

## 2019-06-03 LAB — URINALYSIS, ROUTINE W REFLEX MICROSCOPIC
Bilirubin Urine: NEGATIVE
Bilirubin Urine: NEGATIVE
Glucose, UA: NEGATIVE mg/dL
Glucose, UA: NEGATIVE mg/dL
Ketones, ur: 5 mg/dL — AB
Ketones, ur: 80 mg/dL — AB
Nitrite: POSITIVE — AB
Nitrite: NEGATIVE
Protein, ur: 100 mg/dL — AB
Protein, ur: 100 mg/dL — AB
RBC / HPF: >50 RBC/hpf — ABNORMAL HIGH (ref 0–5)
Specific Gravity, Urine: 1.021 (ref 1.005–1.030)
Specific Gravity, Urine: 1.017 (ref 1.005–1.030)
WBC, UA: >50 WBC/hpf — ABNORMAL HIGH (ref 0–5)
pH: 7 (ref 5.0–8.0)
pH: 5 (ref 5.0–8.0)

## 2019-06-03 LAB — BLOOD CULTURE ID PANEL (REFLEXED)

## 2019-06-03 LAB — CBC WITH DIFFERENTIAL/PLATELET
Abs Immature Granulocytes: 0.11 10*3/uL — ABNORMAL HIGH (ref 0.00–0.07)
Abs Immature Granulocytes: 0.59 10*3/uL — ABNORMAL HIGH (ref 0.00–0.07)
Basophils Absolute: 0 10*3/uL (ref 0.0–0.1)
Basophils Absolute: 0.1 10*3/uL (ref 0.0–0.1)
Basophils Relative: 0 %
Basophils Relative: 0 %
Eosinophils Absolute: 0 10*3/uL (ref 0.0–0.5)
Eosinophils Absolute: 0.1 10*3/uL (ref 0.0–0.5)
Eosinophils Relative: 0 %
Eosinophils Relative: 0 %
HCT: 39.6 % (ref 36.0–46.0)
HCT: 37.6 % (ref 36.0–46.0)
Hemoglobin: 13 g/dL (ref 12.0–15.0)
Hemoglobin: 12.4 g/dL (ref 12.0–15.0)
Immature Granulocytes: 1 %
Immature Granulocytes: 2 %
Lymphocytes Relative: 6 %
Lymphocytes Relative: 4 %
Lymphs Abs: 1.1 10*3/uL (ref 0.7–4.0)
Lymphs Abs: 1.4 10*3/uL (ref 0.7–4.0)
MCH: 27 pg (ref 26.0–34.0)
MCH: 27.3 pg (ref 26.0–34.0)
MCHC: 32.8 g/dL (ref 30.0–36.0)
MCHC: 33 g/dL (ref 30.0–36.0)
MCV: 82.3 fL (ref 80.0–100.0)
MCV: 82.8 fL (ref 80.0–100.0)
Monocytes Absolute: 1.7 10*3/uL — ABNORMAL HIGH (ref 0.1–1.0)
Monocytes Absolute: 2.9 10*3/uL — ABNORMAL HIGH (ref 0.1–1.0)
Monocytes Relative: 9 %
Monocytes Relative: 8 %
Neutro Abs: 16.6 10*3/uL — ABNORMAL HIGH (ref 1.7–7.7)
Neutro Abs: 32.5 10*3/uL — ABNORMAL HIGH (ref 1.7–7.7)
Neutrophils Relative %: 84 %
Neutrophils Relative %: 86 %
Platelets: 260 10*3/uL (ref 150–400)
Platelets: 243 10*3/uL (ref 150–400)
RBC: 4.81 MIL/uL (ref 3.87–5.11)
RBC: 4.54 MIL/uL (ref 3.87–5.11)
RDW: 13.6 % (ref 11.5–15.5)
RDW: 14 % (ref 11.5–15.5)
WBC: 19.6 10*3/uL — ABNORMAL HIGH (ref 4.0–10.5)
WBC: 37.5 10*3/uL — ABNORMAL HIGH (ref 4.0–10.5)
nRBC: 0 % (ref 0.0–0.2)
nRBC: 0 % (ref 0.0–0.2)

## 2019-06-03 LAB — COMPREHENSIVE METABOLIC PANEL
ALT: 16 U/L (ref 0–44)
ALT: 16 U/L (ref 0–44)
AST: 17 U/L (ref 15–41)
AST: 16 U/L (ref 15–41)
Albumin: 4.2 g/dL (ref 3.5–5.0)
Albumin: 3.7 g/dL (ref 3.5–5.0)
Alkaline Phosphatase: 57 U/L (ref 38–126)
Alkaline Phosphatase: 71 U/L (ref 38–126)
Anion gap: 10 (ref 5–15)
Anion gap: 10 (ref 5–15)
BUN: 16 mg/dL (ref 6–20)
BUN: 11 mg/dL (ref 6–20)
CO2: 20 mmol/L — ABNORMAL LOW (ref 22–32)
CO2: 18 mmol/L — ABNORMAL LOW (ref 22–32)
Calcium: 9.1 mg/dL (ref 8.9–10.3)
Calcium: 8.9 mg/dL (ref 8.9–10.3)
Chloride: 104 mmol/L (ref 98–111)
Chloride: 108 mmol/L (ref 98–111)
Creatinine, Ser: 0.77 mg/dL (ref 0.44–1.00)
Creatinine, Ser: 0.65 mg/dL (ref 0.44–1.00)
GFR calc Af Amer: >60 mL/min (ref >60)
GFR calc Af Amer: >60 mL/min (ref >60)
GFR calc non Af Amer: >60 mL/min (ref >60)
GFR calc non Af Amer: >60 mL/min (ref >60)
Glucose, Bld: 99 mg/dL (ref 70–99)
Glucose, Bld: 76 mg/dL (ref 70–99)
Potassium: 3.3 mmol/L — ABNORMAL LOW (ref 3.5–5.1)
Potassium: 3.5 mmol/L (ref 3.5–5.1)
Sodium: 134 mmol/L — ABNORMAL LOW (ref 135–145)
Sodium: 136 mmol/L (ref 135–145)
Total Bilirubin: 0.9 mg/dL (ref 0.3–1.2)
Total Bilirubin: 0.8 mg/dL (ref 0.3–1.2)
Total Protein: 7.2 g/dL (ref 6.5–8.1)
Total Protein: 7.2 g/dL (ref 6.5–8.1)

## 2019-06-03 LAB — LIPASE, BLOOD: Lipase: 18 U/L (ref 11–51)

## 2019-06-03 LAB — BRAIN NATRIURETIC PEPTIDE: B Natriuretic Peptide: 21 pg/mL (ref 0.0–100.0)

## 2019-06-03 LAB — POCT PREGNANCY, URINE: Preg Test, Ur: NEGATIVE

## 2019-06-03 LAB — PROCALCITONIN: Procalcitonin: <0.1 ng/mL

## 2019-06-03 LAB — LACTIC ACID, PLASMA
Lactic Acid, Venous: 0.5 mmol/L (ref 0.5–1.9)
Lactic Acid, Venous: 0.7 mmol/L (ref 0.5–1.9)

## 2019-06-03 MED ORDER — ONDANSETRON 4 MG PO TBDP: ORAL_TABLET | ORAL | 0 refills | Status: DC

## 2019-06-03 MED ORDER — HYDROCODONE-ACETAMINOPHEN 5-325 MG PO TABS: 2.0000 | ORAL_TABLET | Freq: Four times a day (QID) | ORAL | 0 refills | Status: DC | PRN

## 2019-06-03 MED ORDER — SERTRALINE HCL 50 MG PO TABS
50.0000 mg | ORAL_TABLET | Freq: Every day | ORAL | Status: DC
  Administered 2019-06-04 – 2019-06-07 (×4): 50 mg via ORAL
  Filled 2019-06-03 (×4): qty 1

## 2019-06-03 MED ORDER — SODIUM CHLORIDE 0.9% FLUSH: 3.0000 mL | Freq: Once | INTRAVENOUS | Status: DC

## 2019-06-03 MED ORDER — SODIUM CHLORIDE 0.9 % IV SOLN
INTRAVENOUS | Status: DC
  Administered 2019-06-04 – 2019-06-07 (×10): via INTRAVENOUS

## 2019-06-03 MED ORDER — SODIUM CHLORIDE 0.9 % IV BOLUS (SEPSIS)
2000.0000 mL | Freq: Once | INTRAVENOUS | Status: AC
  Administered 2019-06-03: 2000 mL via INTRAVENOUS

## 2019-06-03 MED ORDER — MORPHINE SULFATE (PF) 4 MG/ML IV SOLN
4.0000 mg | Freq: Once | INTRAVENOUS | Status: AC
  Administered 2019-06-03: 4 mg via INTRAVENOUS
  Filled 2019-06-03: qty 1

## 2019-06-03 MED ORDER — ACETAMINOPHEN 500 MG PO TABS
1000.0000 mg | ORAL_TABLET | Freq: Once | ORAL | Status: AC
  Administered 2019-06-03: 1000 mg via ORAL
  Filled 2019-06-03: qty 2

## 2019-06-03 MED ORDER — SODIUM CHLORIDE 0.9 % IV SOLN
1.0000 g | Freq: Once | INTRAVENOUS | Status: AC
  Administered 2019-06-03: 1 g via INTRAVENOUS
  Filled 2019-06-03: qty 1

## 2019-06-03 MED ORDER — SODIUM CHLORIDE 0.9 % IV SOLN
1.0000 g | Freq: Three times a day (TID) | INTRAVENOUS | Status: DC
  Administered 2019-06-04 – 2019-06-05 (×4): 1 g via INTRAVENOUS
  Filled 2019-06-03 (×6): qty 1

## 2019-06-03 MED ORDER — SODIUM CHLORIDE 0.9 % IV BOLUS
1000.0000 mL | Freq: Once | INTRAVENOUS | Status: AC
  Administered 2019-06-03: 23:00:00 1000 mL via INTRAVENOUS

## 2019-06-03 MED ORDER — KETOROLAC TROMETHAMINE 30 MG/ML IJ SOLN
15.0000 mg | Freq: Once | INTRAMUSCULAR | Status: AC
  Administered 2019-06-03: 15 mg via INTRAVENOUS
  Filled 2019-06-03: qty 1

## 2019-06-03 MED ORDER — ACETAMINOPHEN 325 MG PO TABS
650.0000 mg | ORAL_TABLET | Freq: Once | ORAL | Status: AC
  Administered 2019-06-03: 650 mg via ORAL
  Filled 2019-06-03: qty 2

## 2019-06-03 MED ORDER — ENOXAPARIN SODIUM 40 MG/0.4ML ~~LOC~~ SOLN
40.0000 mg | SUBCUTANEOUS | Status: DC
  Administered 2019-06-04 – 2019-06-07 (×4): 40 mg via SUBCUTANEOUS
  Filled 2019-06-03 (×4): qty 0.4

## 2019-06-03 MED ORDER — ACETAMINOPHEN 325 MG PO TABS
650.0000 mg | ORAL_TABLET | Freq: Four times a day (QID) | ORAL | Status: DC | PRN
  Administered 2019-06-04 – 2019-06-07 (×4): 650 mg via ORAL
  Filled 2019-06-03 (×4): qty 2

## 2019-06-03 MED ORDER — TRAZODONE HCL 50 MG PO TABS
25.0000 mg | ORAL_TABLET | Freq: Every evening | ORAL | Status: DC | PRN
  Administered 2019-06-04: 25 mg via ORAL
  Filled 2019-06-03: qty 1

## 2019-06-03 MED ORDER — DOCUSATE SODIUM 100 MG PO CAPS: ORAL_CAPSULE | ORAL | 0 refills | Status: DC

## 2019-06-03 MED ORDER — ACETAMINOPHEN 650 MG RE SUPP: 650.0000 mg | Freq: Four times a day (QID) | RECTAL | Status: DC | PRN

## 2019-06-03 MED ORDER — HYDROCODONE-ACETAMINOPHEN 5-325 MG PO TABS
2.0000 | ORAL_TABLET | Freq: Four times a day (QID) | ORAL | Status: DC | PRN
  Administered 2019-06-04 – 2019-06-05 (×4): 2 via ORAL
  Filled 2019-06-03 (×4): qty 2

## 2019-06-03 MED ORDER — SODIUM CHLORIDE 0.9 % IV SOLN
1.0000 g | INTRAVENOUS | Status: DC
  Administered 2019-06-03: 1 g via INTRAVENOUS
  Filled 2019-06-03: qty 10

## 2019-06-03 MED ORDER — ONDANSETRON HCL 4 MG/2ML IJ SOLN
4.0000 mg | Freq: Once | INTRAMUSCULAR | Status: AC
  Administered 2019-06-03: 4 mg via INTRAVENOUS
  Filled 2019-06-03: qty 2

## 2019-06-03 MED ORDER — CEPHALEXIN 500 MG PO CAPS: 500.0000 mg | ORAL_CAPSULE | Freq: Four times a day (QID) | ORAL | 0 refills | Status: DC

## 2019-06-03 MED ORDER — SODIUM CHLORIDE 0.9 % IV BOLUS
1000.0000 mL | Freq: Once | INTRAVENOUS | Status: AC
  Administered 2019-06-03: 1000 mL via INTRAVENOUS

## 2019-06-03 MED ORDER — ONDANSETRON HCL 4 MG/2ML IJ SOLN
4.0000 mg | Freq: Four times a day (QID) | INTRAMUSCULAR | Status: DC | PRN
  Administered 2019-06-06: 23:00:00 4 mg via INTRAVENOUS
  Filled 2019-06-03: qty 2

## 2019-06-03 MED ORDER — MAGNESIUM HYDROXIDE 400 MG/5ML PO SUSP
30.0000 mL | Freq: Every day | ORAL | Status: DC | PRN
  Administered 2019-06-05: 30 mL via ORAL
  Filled 2019-06-03: qty 30

## 2019-06-03 MED ORDER — ONDANSETRON HCL 4 MG PO TABS: 4.0000 mg | ORAL_TABLET | Freq: Four times a day (QID) | ORAL | Status: DC | PRN

## 2019-06-03 MED ORDER — ONDANSETRON HCL 4 MG/2ML IJ SOLN
4.0000 mg | INTRAMUSCULAR | Status: AC
  Administered 2019-06-03: 4 mg via INTRAVENOUS
  Filled 2019-06-03: qty 2

## 2019-06-03 NOTE — Progress Notes (Signed)
CODE SEPSIS - PHARMACY COMMUNICATION  **Broad Spectrum Antibiotics should be administered within 1 hour of Sepsis diagnosis**  Time Code Sepsis Called/Page Received: 2211  Antibiotics Ordered: meropenem  Time of 1st antibiotic administration: 2243  Additional action taken by pharmacy: please see BCID note  If necessary, Name of Provider/Nurse Contacted:     Tobie Lords ,PharmD Clinical Pharmacist  06/03/2019  11:14 PM

## 2019-06-03 NOTE — ED Notes (Signed)
Pt reports lower back pain for 3-4 days; yesterday the pain began to worsen until it was unbearable today; pt reports low back tenderness on palpation; denies fever at home; reports urinary frequency but no dysuria or hematuria noted; has been taking ibuprofen and tylenol with little relief; adds she did notice a foul odor to her urine beginning Saturday;
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
## 2019-06-03 NOTE — ED Notes (Signed)
Xray tech at bedside for portable films

## 2019-06-03 NOTE — Progress Notes (Addendum)
PHARMACY - PHYSICIAN COMMUNICATION CRITICAL VALUE ALERT - BLOOD CULTURE IDENTIFICATION (BCID)  Kathleen Glass is an 23 y.o. female who presented to [facility] on 06/03/2019 with a chief complaint of patient was here earlier this am w/ c/o back pain.  Assessment:  Tmax 102.55F, HR 115 - 130, tachypneic @ 22 - 24, WBC 19.6, w/ left shift, initial UA had + leuks, +nitrites w/ rare bacteria, WBC >50; repeat UA +leuks, rare bacteria. 1/4 anaerobic GNR BCID Enterobacteriaceae E. Coli [facility]-  Name of physician (or Provider) Contacted: Conni Slipper  Current antibiotics: Patient had been discharged on cephelaxin and now returns on her own, spoke to Dr. Cinda Quest to bring patient to main ED (patient was in triage) and to start meropenem  Changes to prescribed antibiotics recommended:  Recommendations accepted by provider -- Code sepsis was initiated and patient will be started on meropenem 1g IV q8h and will continue to follow cx/sx.  Results for orders placed or performed during the hospital encounter of 06/03/19  Blood Culture ID Panel (Reflexed) (Collected: 06/03/2019  2:56 AM)  Result Value Ref Range   Enterococcus species NOT DETECTED NOT DETECTED   Listeria monocytogenes NOT DETECTED NOT DETECTED   Staphylococcus species NOT DETECTED NOT DETECTED   Staphylococcus aureus (BCID) NOT DETECTED NOT DETECTED   Streptococcus species NOT DETECTED NOT DETECTED   Streptococcus agalactiae NOT DETECTED NOT DETECTED   Streptococcus pneumoniae NOT DETECTED NOT DETECTED   Streptococcus pyogenes NOT DETECTED NOT DETECTED   Acinetobacter baumannii NOT DETECTED NOT DETECTED   Enterobacteriaceae species DETECTED (A) NOT DETECTED   Enterobacter cloacae complex NOT DETECTED NOT DETECTED   Escherichia coli DETECTED (A) NOT DETECTED   Klebsiella oxytoca NOT DETECTED NOT DETECTED   Klebsiella pneumoniae NOT DETECTED NOT DETECTED   Proteus species NOT DETECTED NOT DETECTED   Serratia marcescens NOT DETECTED NOT DETECTED    Carbapenem resistance NOT DETECTED NOT DETECTED   Haemophilus influenzae NOT DETECTED NOT DETECTED   Neisseria meningitidis NOT DETECTED NOT DETECTED   Pseudomonas aeruginosa NOT DETECTED NOT DETECTED   Candida albicans NOT DETECTED NOT DETECTED   Candida glabrata NOT DETECTED NOT DETECTED   Candida krusei NOT DETECTED NOT DETECTED   Candida parapsilosis NOT DETECTED NOT DETECTED   Candida tropicalis NOT DETECTED NOT DETECTED   Tobie Lords, PharmD, BCPS Clinical Pharmacist 06/03/2019  10:09 PM

## 2019-06-03 NOTE — Discharge Instructions (Addendum)
Your workup today suggests that you have a urinary tract infection (UTI) which has spread to your kidneys.  Please take your antibiotic as prescribed and over-the-counter pain medication (Tylenol or Motrin) as needed, but no more than recommended on the label instructions.  Drink PLENTY of fluids.  You should also take the prescribed stool softener as well as an over-the-counter medication such as Miralax, because it appears you are also significantly constipated.  Call your regular doctor to schedule the next available appointment to follow up on todays ED visit, or return immediately to the ED if your pain worsens, you have decreased urine production, develop fever, persistent vomiting, or other symptoms that concern you.

## 2019-06-03 NOTE — ED Triage Notes (Signed)
Patient seen in ED early this morning dx'd with kidney infection.  Patient was unable to get medication filled due to problem with insurance.

## 2019-06-03 NOTE — ED Triage Notes (Signed)
Patient reports having left lower back pain that radiates across lower back and upward.  Also reports urinary frequency.

## 2019-06-03 NOTE — ED Notes (Signed)
2nd set blood cultures drawn from right hand; pt tolerated well;

## 2019-06-03 NOTE — ED Notes (Signed)
No cardiac leads in room

## 2019-06-03 NOTE — ED Notes (Signed)
Dr. Forbach at bedside.  

## 2019-06-03 NOTE — ED Notes (Signed)
Spoke with Dr Jimmye Norman, ordered UA.

## 2019-06-03 NOTE — ED Provider Notes (Addendum)
[facility] Emergency Department Provider Note  ____________________________________________   First MD Initiated Contact with Patient 06/03/19 2200     (approximate)  I have reviewed the triage vital signs and the nursing notes.   HISTORY  Chief Complaint Fever    HPI Kathleen Glass is a 23 y.o. female here with bilateral flank pain.  The patient states that over the last several days, she is had progressively worsening aching suprapubic and now bilateral flank pain.  She has had associated fever and chills.  She was seen overnight, and states she felt better after receiving fluids and antibiotics.  She returns today due to ongoing nausea vomiting, and pain.  Patient has a history of recurrent UTIs but denies history of multidrug-resistant organisms.  She states that she has been unable to eat or drink since leaving.  She was also unable to fill her medication due to insurance issues.  Setting presents for further evaluation.  Of note, per pharmacy, patient had positive blood cultures.        No past medical history on file.  Patient Active Problem List   Diagnosis Date Noted  . Sepsis ([place]) 06/03/2019  . Pyelonephritis 06/27/2018  . Lump or mass in breast 05/30/2013  . 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 BREAST SURGERY Left 06-12-13   benign fibroadenoma    Prior to Admission medications   Medication Sig Start Date End Date Taking? Authorizing Provider  acetaminophen (TYLENOL) 500 MG tablet Take 500 mg by mouth every 6 (six) hours as needed for mild pain, fever or headache.   Yes [provider]  cephALEXin (KEFLEX) 500 MG capsule Take 1 capsule (500 mg total) by mouth 4 (four) times daily for 12 days. 06/03/19 06/15/19 Yes Hinda Kehr, MD  docusate sodium (COLACE) 100 MG capsule Take 1 tablet once or twice daily as needed for constipation while taking narcotic pain medicine 06/03/19  Yes  Hinda Kehr, MD  etonogestrel (NEXPLANON) 68 MG IMPL implant 1 each by Subdermal route once.   Yes [provider]  HYDROcodone-acetaminophen (NORCO/VICODIN) 5-325 MG tablet Take 2 tablets by mouth every 6 (six) hours as needed for moderate pain or severe pain. 06/03/19  Yes Hinda Kehr, MD  ondansetron (ZOFRAN ODT) 4 MG disintegrating tablet Allow 1-2 tablets to dissolve in your mouth every 8 hours as needed for nausea/vomiting 06/03/19  Yes Hinda Kehr, MD  sertraline (ZOLOFT) 50 MG tablet Take 1 tablet (50 mg total) by mouth daily. Patient not taking: Reported on 06/04/2019 01/04/19   Diona Fanti, CNM    Allergies Patient has no known allergies.  Family History  Problem Relation Age of Onset  . Breast cancer Other   . Breast cancer Paternal Aunt        great aunt    Social History Social History   Tobacco Use  . Smoking status: Former Smoker    Packs/day: 0.50    Types: Cigarettes  . Smokeless tobacco: Never Used  Substance Use Topics  . Alcohol use: No  . Drug use: No    Review of Systems  Review of Systems  Constitutional: Positive for chills, fatigue and fever.  HENT: Negative for congestion and sore throat.   Eyes: Negative for visual disturbance.  Respiratory: Negative for cough and shortness of breath.   Cardiovascular: Negative for chest pain.  Gastrointestinal: Positive for abdominal pain, nausea and vomiting. Negative for diarrhea.  Genitourinary: Positive for  dysuria and flank pain.  Musculoskeletal: Negative for back pain and neck pain.  Skin: Negative for rash and wound.  Neurological: Negative for weakness.  All other systems reviewed and are negative.    ____________________________________________  PHYSICAL EXAM:      VITAL SIGNS: ED Triage Vitals  Enc Vitals Group     BP 06/03/19 2003 135/75     Pulse Rate 06/03/19 2003 (!) 130     Resp 06/03/19 2003 (!) 22     Temp 06/03/19 2003 (!) 101.6 F (38.7 C)     Temp Source  06/03/19 2003 Oral     SpO2 06/03/19 2003 98 %     Weight 06/03/19 2004 120 lb (54.4 kg)     Height 06/03/19 2004 5\' 2"  (1.575 m)     Head Circumference --      Peak Flow --      Pain Score 06/03/19 2325 10     Pain Loc --      Pain Edu? --      Excl. in [place]? --      Physical Exam Vitals signs and nursing note reviewed.  Constitutional:      General: She is not in acute distress.    Appearance: She is well-developed. She is ill-appearing.  HENT:     Head: Normocephalic and atraumatic.  Eyes:     Conjunctiva/sclera: Conjunctivae normal.  Neck:     Musculoskeletal: Neck supple.  Cardiovascular:     Rate and Rhythm: Normal rate and regular rhythm.     Heart sounds: Normal heart sounds. No murmur. No friction rub.  Pulmonary:     Effort: Pulmonary effort is normal. No respiratory distress.     Breath sounds: Normal breath sounds. No wheezing or rales.  Abdominal:     General: There is no distension.     Palpations: Abdomen is soft.     Tenderness: There is abdominal tenderness in the suprapubic area. There is right CVA tenderness and left CVA tenderness.  Skin:    General: Skin is warm.     Capillary Refill: Capillary refill takes less than 2 seconds.     Findings: No rash.  Neurological:     Mental Status: She is alert and oriented to person, place, and time.     Motor: No abnormal muscle tone.       ____________________________________________   LABS (all labs ordered are listed, but only abnormal results are displayed)  Labs Reviewed  URINALYSIS, ROUTINE W REFLEX MICROSCOPIC - Abnormal; Notable for the following components:      Result Value   Color, Urine YELLOW (*)    APPearance HAZY (*)    Hgb urine dipstick MODERATE (*)    Ketones, ur 80 (*)    Protein, ur 100 (*)    Leukocytes,Ua SMALL (*)    RBC / HPF >50 (*)    Bacteria, UA RARE (*)    All other components within normal limits  COMPREHENSIVE METABOLIC PANEL - Abnormal; Notable for the following  components:   CO2 18 (*)    All other components within normal limits  CBC WITH DIFFERENTIAL/PLATELET - Abnormal; Notable for the following components:   WBC 37.5 (*)    Neutro Abs 32.5 (*)    Monocytes Absolute 2.9 (*)    Abs Immature Granulocytes 0.59 (*)    All other components within normal limits  CULTURE, BLOOD (ROUTINE X 2)  CULTURE, BLOOD (ROUTINE X 2)  SARS CORONAVIRUS 2  LACTIC ACID,  PLASMA  HIV ANTIBODY (ROUTINE TESTING W REFLEX)  BASIC METABOLIC PANEL  CBC  POC URINE PREG, ED    ____________________________________________  EKG: Sinus tachycardia, ventricular rate 107.  QRS 23, QTc 474.  No acute ischemic changes.  No ST or T-segment changes. ________________________________________  RADIOLOGY All imaging, including plain films, CT scans, and ultrasounds, independently reviewed by me, and interpretations confirmed via formal radiology reads.  ED MD interpretation:   US Renal: Pending  Official radiology report(s): Dg Abdomen 1 View  Result Date: 06/03/2019 CLINICAL DATA:  Left flank pain.  Urinary tract infection. EXAM: ABDOMEN - 1 VIEW COMPARISON:  CT abdomen pelvis 06/27/2018 FINDINGS: Gas is demonstrated within nondilated loops of large and small bowel in a nonobstructed pattern. Large amount of stool within the cecum and ascending colon. No definite radiopaque calcification within the expected location of the kidneys or ureters. Osseous structures unremarkable. IMPRESSION: No acute process within the abdomen. Large amount of stool within the cecum and ascending colon. Electronically Signed   By: Lovey Newcomer M.D.   On: 06/03/2019 06:00   US Renal  Result Date: 06/03/2019 CLINICAL DATA:  Possible obstruction EXAM: RENAL / URINARY TRACT ULTRASOUND COMPLETE COMPARISON:  None. FINDINGS: Right Kidney: Renal measurements: 12.6 x 5.4 x 6.4 cm. = volume: 228 mL. Mild increased echogenicity is noted. No hydronephrosis is seen. Left Kidney: Renal measurements: 11.3 x 7.0 x 6.1  cm. = volume: 252 mL. Echogenicity within normal limits. No mass or hydronephrosis visualized. Bladder: Appears normal for degree of bladder distention. IMPRESSION: No obstructive changes are noted. Mild increased echogenicity is noted within the right kidney. Electronically Signed   By: Inez Catalina M.D.   On: 06/03/2019 23:30    ____________________________________________  PROCEDURES   Procedure(s) performed (including Critical Care):  .Critical Care Performed by: Duffy Bruce, MD Authorized by: Duffy Bruce, MD   Critical care provider statement:    Critical care time (minutes):  35   Critical care time was exclusive of:  Separately billable procedures and treating other patients and teaching time   Critical care was necessary to treat or prevent imminent or life-threatening deterioration of the following conditions:  Cardiac failure, circulatory failure and sepsis   Critical care was time spent personally by me on the following activities:  Development of treatment plan with patient or surrogate, discussions with consultants, evaluation of patient's response to treatment, examination of patient, obtaining history from patient or surrogate, ordering and performing treatments and interventions, ordering and review of laboratory studies, ordering and review of radiographic studies, pulse oximetry, re-evaluation of patient's condition and review of old charts   I assumed direction of critical care for this patient from another provider in my specialty: no      ____________________________________________  INITIAL IMPRESSION / MDM / [place] / ED COURSE  As part of my medical decision making, I reviewed the following data within the electronic MEDICAL RECORD NUMBER Notes from prior ED visits and [place] Controlled Substance Database      *Kathleen Glass was evaluated in Emergency Department on 06/04/2019 for the symptoms described in the history of present illness. She was  evaluated in the context of the global COVID-19 pandemic, which necessitated consideration that the patient might be at risk for infection with the SARS-CoV-2 virus that causes COVID-19. Institutional protocols and algorithms that pertain to the evaluation of patients at risk for COVID-19 are in a state of rapid change based on information released by regulatory bodies including the CDC and  federal and state organizations. These policies and algorithms were followed during the patient's care in the ED.  Some ED evaluations and interventions may be delayed as a result of limited staffing during the pandemic.*      Medical Decision Making: 23 yo F here with E. Coli bacteremia likely 2/2 pyelonephritis. IV Meropenem, sepsis protocol ordered. Pt mentating well, no signs of shock. Given hematuria, U/S ordered which shows no signs of obstruction. Do not suspect infected stone. Admit to medicine. ____________________________________________  FINAL CLINICAL IMPRESSION(S) / ED DIAGNOSES  Final diagnoses:  Sepsis due to Escherichia coli without acute organ dysfunction (HCC)  Pyelonephritis     MEDICATIONS GIVEN DURING THIS VISIT:  Medications  sodium chloride flush (NS) 0.9 % injection 3 mL ( Intravenous Not Given 06/03/19 2334)  HYDROcodone-acetaminophen (NORCO/VICODIN) 5-325 MG per tablet 2 tablet (has no administration in time range)  sertraline (ZOLOFT) tablet 50 mg (has no administration in time range)  enoxaparin (LOVENOX) injection 40 mg (has no administration in time range)  0.9 %  sodium chloride infusion (has no administration in time range)  acetaminophen (TYLENOL) tablet 650 mg (has no administration in time range)    Or  acetaminophen (TYLENOL) suppository 650 mg (has no administration in time range)  traZODone (DESYREL) tablet 25 mg (has no administration in time range)  magnesium hydroxide (MILK OF MAGNESIA) suspension 30 mL (has no administration in time range)  ondansetron (ZOFRAN)  tablet 4 mg (has no administration in time range)    Or  ondansetron (ZOFRAN) injection 4 mg (has no administration in time range)  meropenem (MERREM) 1 g in sodium chloride 0.9 % 100 mL IVPB (has no administration in time range)  acetaminophen (TYLENOL) tablet 650 mg (650 mg Oral Given 06/03/19 2009)  meropenem (MERREM) 1 g in sodium chloride 0.9 % 100 mL IVPB (0 g Intravenous Stopped 06/03/19 2320)  sodium chloride 0.9 % bolus 1,000 mL (0 mLs Intravenous Stopped 06/03/19 2333)  sodium chloride 0.9 % bolus 1,000 mL (0 mLs Intravenous Stopped 06/04/19 0026)  morphine 4 MG/ML injection 4 mg (4 mg Intravenous Given 06/03/19 2335)  ketorolac (TORADOL) 30 MG/ML injection 15 mg (15 mg Intravenous Given 06/03/19 2337)  ondansetron (ZOFRAN) injection 4 mg (4 mg Intravenous Given 06/03/19 2334)     ED Discharge Orders    None       Note:  This document was prepared using Dragon voice recognition software and may include unintentional dictation errors.   Duffy Bruce, MD 06/04/19 [place]    Duffy Bruce, MD 06/04/19 Mary Sella    Duffy Bruce, MD 06/10/19 [phone]

## 2019-06-03 NOTE — Progress Notes (Signed)
CODE SEPSIS - PHARMACY COMMUNICATION  **Broad Spectrum Antibiotics should be administered within 1 hour of Sepsis diagnosis**  Time Code Sepsis Called/Page Received: 0230  Antibiotics Ordered: ceftriaxone  Time of 1st antibiotic administration: 0319  Additional action taken by pharmacy:   If necessary, Name of Provider/Nurse Contacted:     Tobie Lords ,PharmD Clinical Pharmacist  06/03/2019  3:21 AM

## 2019-06-04 ENCOUNTER — Encounter: Payer: Self-pay | Admitting: Nurse Practitioner

## 2019-06-04 LAB — CBC
HCT: 36.7 % (ref 36.0–46.0)
Hemoglobin: 11.9 g/dL — ABNORMAL LOW (ref 12.0–15.0)
MCH: 27.3 pg (ref 26.0–34.0)
MCHC: 32.4 g/dL (ref 30.0–36.0)
MCV: 84.2 fL (ref 80.0–100.0)
Platelets: 216 10*3/uL (ref 150–400)
RBC: 4.36 MIL/uL (ref 3.87–5.11)
RDW: 14 % (ref 11.5–15.5)
WBC: 32.2 10*3/uL — ABNORMAL HIGH (ref 4.0–10.5)
nRBC: 0 % (ref 0.0–0.2)

## 2019-06-04 LAB — LACTIC ACID, PLASMA
Lactic Acid, Venous: 0.5 mmol/L (ref 0.5–1.9)
Lactic Acid, Venous: 0.6 mmol/L (ref 0.5–1.9)

## 2019-06-04 LAB — BASIC METABOLIC PANEL
Anion gap: 8 (ref 5–15)
BUN: 11 mg/dL (ref 6–20)
CO2: 18 mmol/L — ABNORMAL LOW (ref 22–32)
Calcium: 8.1 mg/dL — ABNORMAL LOW (ref 8.9–10.3)
Chloride: 112 mmol/L — ABNORMAL HIGH (ref 98–111)
Creatinine, Ser: 0.54 mg/dL (ref 0.44–1.00)
GFR calc Af Amer: >60 mL/min (ref >60)
GFR calc non Af Amer: >60 mL/min (ref >60)
Glucose, Bld: 71 mg/dL (ref 70–99)
Potassium: 3.8 mmol/L (ref 3.5–5.1)
Sodium: 138 mmol/L (ref 135–145)

## 2019-06-04 LAB — SARS CORONAVIRUS 2 (TAT 6-24 HRS): SARS Coronavirus 2: NEGATIVE

## 2019-06-04 MED ORDER — POTASSIUM CHLORIDE 20 MEQ PO PACK
40.0000 meq | PACK | Freq: Once | ORAL | Status: AC
  Administered 2019-06-04: 40 meq via ORAL
  Filled 2019-06-04: qty 2

## 2019-06-04 MED ORDER — POLYETHYLENE GLYCOL 3350 17 G PO PACK
17.0000 g | PACK | Freq: Once | ORAL | Status: AC
  Administered 2019-06-04: 17 g via ORAL
  Filled 2019-06-04: qty 1

## 2019-06-04 NOTE — ED Notes (Signed)
Floor to call back d/t no tele boxes

## 2019-06-04 NOTE — ED Notes (Signed)
Pt c/o lower left and right back pain for the last week (was in the ED last night for same), hx of UTI since age 23, 1 prior episode of pyelonephritis, pt was positive for bacteria in the blood cultures and per pharm needs IV ABX meropenum

## 2019-06-04 NOTE — Progress Notes (Signed)
Order received from Dr Stark Jock to increase IVF to 174ml/hr and renew telemetry

## 2019-06-04 NOTE — Clinical Social Work Note (Addendum)
CSW acknowledges consult that patient having difficulty affording medications due to issues with Medicaid coverage. Epic shows that she does not have insurance. CSW left voicemail for financial counselor to see if they can look up the status of her Medicaid.  Dayton Scrape, [place] [phone]  2:40 pm Financial counselor is about to call patient to screen her for Medicaid eligibility. She will notify CSW of result.  Dayton Scrape, [place]

## 2019-06-04 NOTE — ED Notes (Signed)
ED TO INPATIENT HANDOFF REPORT  ED Nurse Name and Phone #: Ena Dawley [phone]  S Name/Age/Gender Kathleen Glass 23 y.o. female Room/Bed: ED17A/ED17A  Code Status   Code Status: Full Code  Home/SNF/Other Home Patient oriented to: self, place, time and situation Is this baseline? Yes   Triage Complete: Triage complete  Chief Complaint Fever; chills  Triage Note Patient seen in ED early this morning dx'd with kidney infection.  Patient was unable to get medication filled due to problem with insurance.   Allergies No Known Allergies  Level of Care/Admitting Diagnosis ED Disposition    ED Disposition Condition [facility]: [place] [[id number]]  Level of Care: Med-Surg [16]  Covid Evaluation: Asymptomatic Screening Protocol (No Symptoms)  Diagnosis: Sepsis [facility]) [[id number]]  Admitting Physician: Christel Mormon [[id number]]  Attending Physician: Christel Mormon [[id number]]  Estimated length of stay: past midnight tomorrow  Certification:: I certify this patient will need inpatient services for at least 2 midnights  PT Class (Do Not Modify): Inpatient [101]  PT Acc Code (Do Not Modify): Private [1]       B Medical/Surgery History No past medical history on file. Past Surgical History:  Procedure Laterality Date  . BREAST SURGERY Left 06-12-13   benign fibroadenoma     A IV Location/Drains/Wounds Patient Lines/Drains/Airways Status   Active Line/Drains/Airways    Name:   Placement date:   Placement time:   Site:   Days:   Peripheral IV 06/03/19 Right Forearm   06/03/19    1025    Forearm   1          Intake/Output Last 24 hours  Intake/Output Summary (Last 24 hours) at 06/04/2019 0000 Last data filed at 06/03/2019 2333 Gross per 24 hour  Intake 1100 ml  Output -  Net 1100 ml    Labs/Imaging Results for orders placed or performed during the hospital encounter of 06/03/19 (from the past 48 hour(s))  Urinalysis, Routine w reflex  microscopic     Status: Abnormal   Collection Time: 06/03/19  8:12 PM  Result Value Ref Range   Color, Urine YELLOW (A) YELLOW   APPearance HAZY (A) CLEAR   Specific Gravity, Urine 1.017 1.005 - 1.030   pH 5.0 5.0 - 8.0   Glucose, UA NEGATIVE NEGATIVE mg/dL   Hgb urine dipstick MODERATE (A) NEGATIVE   Bilirubin Urine NEGATIVE NEGATIVE   Ketones, ur 80 (A) NEGATIVE mg/dL   Protein, ur 100 (A) NEGATIVE mg/dL   Nitrite NEGATIVE NEGATIVE   Leukocytes,Ua SMALL (A) NEGATIVE   RBC / HPF >50 (H) 0 - 5 RBC/hpf   WBC, UA 21-50 0 - 5 WBC/hpf   Bacteria, UA RARE (A) NONE SEEN   Squamous Epithelial / LPF 0-5 0 - 5   Mucus PRESENT     Comment: Performed at [facility], [address] [place], [place] [address]  Comprehensive metabolic panel     Status: Abnormal   Collection Time: 06/03/19 10:13 PM  Result Value Ref Range   Sodium 136 135 - 145 mmol/L   Potassium 3.5 3.5 - 5.1 mmol/L   Chloride 108 98 - 111 mmol/L   CO2 18 (L) 22 - 32 mmol/L   Glucose, Bld 76 70 - 99 mg/dL   BUN 11 6 - 20 mg/dL   Creatinine, Ser 0.65 0.44 - 1.00 mg/dL   Calcium 8.9 8.9 - 10.3 mg/dL   Total Protein 7.2 6.5 - 8.1 g/dL  Albumin 3.7 3.5 - 5.0 g/dL   AST 16 15 - 41 U/L   ALT 16 0 - 44 U/L   Alkaline Phosphatase 71 38 - 126 U/L   Total Bilirubin 0.8 0.3 - 1.2 mg/dL   GFR calc non Af Amer >60 >60 mL/min   GFR calc Af Amer >60 >60 mL/min   Anion gap 10 5 - 15    Comment: Performed at [facility] [address] [place], [place] [address]  CBC with Differential     Status: Abnormal   Collection Time: 06/03/19 10:13 PM  Result Value Ref Range   WBC 37.5 (H) 4.0 - 10.5 K/uL   RBC 4.54 3.87 - 5.11 MIL/uL   Hemoglobin 12.4 12.0 - 15.0 g/dL   HCT 37.6 36.0 - 46.0 %   MCV 82.8 80.0 - 100.0 fL   MCH 27.3 26.0 - 34.0 pg   MCHC 33.0 30.0 - 36.0 g/dL   RDW 14.0 11.5 - 15.5 %   Platelets 243 150 - 400 K/uL   nRBC 0.0 0.0 - 0.2 %   Neutrophils Relative % 86 %   Neutro Abs 32.5 (H) 1.7 -  7.7 K/uL   Lymphocytes Relative 4 %   Lymphs Abs 1.4 0.7 - 4.0 K/uL   Monocytes Relative 8 %   Monocytes Absolute 2.9 (H) 0.1 - 1.0 K/uL   Eosinophils Relative 0 %   Eosinophils Absolute 0.1 0.0 - 0.5 K/uL   Basophils Relative 0 %   Basophils Absolute 0.1 0.0 - 0.1 K/uL   Immature Granulocytes 2 %   Abs Immature Granulocytes 0.59 (H) 0.00 - 0.07 K/uL    Comment: Performed at [facility], [address] [place], [place] [address]  Lactic acid, plasma     Status: None   Collection Time: 06/03/19 10:13 PM  Result Value Ref Range   Lactic Acid, Venous 0.7 0.5 - 1.9 mmol/L    Comment: Performed at [facility], [address], [place], [place] [address]   Dg Abdomen 1 View  Result Date: 06/03/2019 CLINICAL DATA:  Left flank pain.  Urinary tract infection. EXAM: ABDOMEN - 1 VIEW COMPARISON:  CT abdomen pelvis 06/27/2018 FINDINGS: Gas is demonstrated within nondilated loops of large and small bowel in a nonobstructed pattern. Large amount of stool within the cecum and ascending colon. No definite radiopaque calcification within the expected location of the kidneys or ureters. Osseous structures unremarkable. IMPRESSION: No acute process within the abdomen. Large amount of stool within the cecum and ascending colon. Electronically Signed   By: Lovey Newcomer M.D.   On: 06/03/2019 06:00   US Renal  Result Date: 06/03/2019 CLINICAL DATA:  Possible obstruction EXAM: RENAL / URINARY TRACT ULTRASOUND COMPLETE COMPARISON:  None. FINDINGS: Right Kidney: Renal measurements: 12.6 x 5.4 x 6.4 cm. = volume: 228 mL. Mild increased echogenicity is noted. No hydronephrosis is seen. Left Kidney: Renal measurements: 11.3 x 7.0 x 6.1 cm. = volume: 252 mL. Echogenicity within normal limits. No mass or hydronephrosis visualized. Bladder: Appears normal for degree of bladder distention. IMPRESSION: No obstructive changes are noted. Mild increased echogenicity is noted within the right kidney.  Electronically Signed   By: Inez Catalina M.D.   On: 06/03/2019 23:30    Pending Labs Unresulted Labs (From admission, onward)    Start     Ordered   06/04/19 4081  Basic metabolic panel  Tomorrow morning,   STAT     06/03/19 2349   06/04/19  0500  CBC  Tomorrow morning,   STAT     06/03/19 2349   06/03/19 2345  HIV antibody (Routine Testing)  Once,   STAT     06/03/19 2349   06/03/19 2342  SARS CORONAVIRUS 2 Nasal Swab Aptima Multi Swab  (Asymptomatic Patients Labs)  ONCE - STAT,   STAT    Question Answer Comment  Is this test for diagnosis or screening Screening   Symptomatic for COVID-19 as defined by CDC No   Hospitalized for COVID-19 No   Admitted to ICU for COVID-19 No   Previously tested for COVID-19 No   Resident in a congregate (group) care setting No   Employed in healthcare setting No   Pregnant No      06/03/19 2342   06/03/19 2205  Lactic acid, plasma  Now then every 2 hours,   STAT     06/03/19 2205   06/03/19 2205  Blood Culture (routine x 2)  BLOOD CULTURE X 2,   STAT     06/03/19 2205          Vitals/Pain Today's Vitals   06/03/19 2003 06/03/19 2004 06/03/19 2325 06/03/19 2326  BP: 135/75  115/71   Pulse: (!) 130  (!) 107   Resp: (!) 22  20   Temp: (!) 101.6 F (38.7 C)     TempSrc: Oral     SpO2: 98%  100%   Weight:  54.4 kg    Height:  5\' 2"  (1.575 m)    PainSc:   10-Worst pain ever 10-Worst pain ever    Isolation Precautions No active isolations  Medications Medications  sodium chloride flush (NS) 0.9 % injection 3 mL ( Intravenous Not Given 06/03/19 2334)  HYDROcodone-acetaminophen (NORCO/VICODIN) 5-325 MG per tablet 2 tablet (has no administration in time range)  sertraline (ZOLOFT) tablet 50 mg (has no administration in time range)  enoxaparin (LOVENOX) injection 40 mg (has no administration in time range)  0.9 %  sodium chloride infusion (has no administration in time range)  acetaminophen (TYLENOL) tablet 650 mg (has no administration in  time range)    Or  acetaminophen (TYLENOL) suppository 650 mg (has no administration in time range)  traZODone (DESYREL) tablet 25 mg (has no administration in time range)  magnesium hydroxide (MILK OF MAGNESIA) suspension 30 mL (has no administration in time range)  ondansetron (ZOFRAN) tablet 4 mg (has no administration in time range)    Or  ondansetron (ZOFRAN) injection 4 mg (has no administration in time range)  meropenem (MERREM) 1 g in sodium chloride 0.9 % 100 mL IVPB (has no administration in time range)  acetaminophen (TYLENOL) tablet 650 mg (650 mg Oral Given 06/03/19 2009)  meropenem (MERREM) 1 g in sodium chloride 0.9 % 100 mL IVPB (0 g Intravenous Stopped 06/03/19 2320)  sodium chloride 0.9 % bolus 1,000 mL (0 mLs Intravenous Stopped 06/03/19 2333)  sodium chloride 0.9 % bolus 1,000 mL (1,000 mLs Intravenous New Bag/Given 06/03/19 2320)  morphine 4 MG/ML injection 4 mg (4 mg Intravenous Given 06/03/19 2335)  ketorolac (TORADOL) 30 MG/ML injection 15 mg (15 mg Intravenous Given 06/03/19 2337)  ondansetron (ZOFRAN) injection 4 mg (4 mg Intravenous Given 06/03/19 2334)    Mobility walks Low fall risk   Focused Assessments Renal Assessment Handoff:        R Recommendations: See Admitting Provider Note  Report given to:   Additional Notes: pt's fiance aware via pt's cell phone

## 2019-06-04 NOTE — Progress Notes (Signed)
[facility] [place] NAME: Katelynne Revak    MR#:  [id number]  DATE OF BIRTH:  [birth year]  SUBJECTIVE:  CHIEF COMPLAINT:   Chief Complaint  Patient presents with  . Fever   No new complaint this morning.  Still has some mild left flank pains.  Being treated for pyelonephritis.  REVIEW OF SYSTEMS:  Review of Systems  Constitutional: Negative for chills.       Fevers last night.  No fever this morning.  HENT: Negative for hearing loss and tinnitus.   Eyes: Negative for blurred vision and double vision.  Respiratory: Negative for cough and hemoptysis.   Cardiovascular: Negative for chest pain and palpitations.  Gastrointestinal: Negative for heartburn, nausea and vomiting.       Left flank pains  Genitourinary: Negative for dysuria and urgency.  Musculoskeletal: Negative for myalgias and neck pain.       Left flank pains  Neurological: Negative for dizziness and headaches.  Psychiatric/Behavioral: Negative for depression and hallucinations.    DRUG ALLERGIES:  No Known Allergies VITALS:  Blood pressure 105/60, pulse (!) 101, temperature 98.2 F (36.8 C), temperature source Oral, resp. rate 18, height 5\' 2"  (1.575 m), weight 61.4 kg, SpO2 98 %, not currently breastfeeding. PHYSICAL EXAMINATION:  Physical Exam   GENERAL:  23 y.o.-year-old Caucasian female patient lying in the bed with no acute distress.  EYES: Pupils equal, round, reactive to light and accommodation. No scleral icterus. Extraocular muscles intact.  HEENT: Head atraumatic, normocephalic. Oropharynx and nasopharynx clear.  NECK:  Supple, no jugular venous distention. No thyroid enlargement, no tenderness.  LUNGS: Normal breath sounds bilaterally, no wheezing, rales,rhonchi or crepitation. No use of accessory muscles of respiration.  CARDIOVASCULAR: Regular rate and rhythm, S1, S2 normal. No murmurs, rubs, or gallops.  ABDOMEN: Soft, nondistended, nontender. Bowel  sounds present. No organomegaly or mass.  She has mild bilateral CVA tenderness left more than right. EXTREMITIES: No pedal edema, cyanosis, or clubbing.  NEUROLOGIC: Cranial nerves II through XII are intact. Muscle strength 5/5 in all extremities. Sensation intact. Gait not checked.  PSYCHIATRIC: The patient is alert and oriented x 3.  Normal affect and good eye contact. SKIN: No obvious rash, lesion, or ulcer.  LABORATORY PANEL:  Female CBC Recent Labs  Lab 06/04/19 0201  WBC 32.2*  HGB 11.9*  HCT 36.7  PLT 216   ------------------------------------------------------------------------------------------------------------------ Chemistries  Recent Labs  Lab 06/03/19 2213 06/04/19 0201  NA 136 138  K 3.5 3.8  CL 108 112*  CO2 18* 18*  GLUCOSE 76 71  BUN 11 11  CREATININE 0.65 0.54  CALCIUM 8.9 8.1*  AST 16  --   ALT 16  --   ALKPHOS 71  --   BILITOT 0.8  --    RADIOLOGY:  US Renal  Result Date: 06/03/2019 CLINICAL DATA:  Possible obstruction EXAM: RENAL / URINARY TRACT ULTRASOUND COMPLETE COMPARISON:  None. FINDINGS: Right Kidney: Renal measurements: 12.6 x 5.4 x 6.4 cm. = volume: 228 mL. Mild increased echogenicity is noted. No hydronephrosis is seen. Left Kidney: Renal measurements: 11.3 x 7.0 x 6.1 cm. = volume: 252 mL. Echogenicity within normal limits. No mass or hydronephrosis visualized. Bladder: Appears normal for degree of bladder distention. IMPRESSION: No obstructive changes are noted. Mild increased echogenicity is noted within the right kidney. Electronically Signed   By: Inez Catalina M.D.   On: 06/03/2019 23:30   ASSESSMENT AND PLAN:   1.  Acute  pyelonephritis with subsequent bacteremia and sepsis.   Patient on IV fluids and empiric IV antibiotics with meropenem pending results of urine culture and sensitivities.  Recent blood cultures from 06/03/2019 growing E. coli and Enterobactereciaceae Monitor clinically.   2.  Depression.  We will continue Zoloft  3.     Constipation Given a dose of MiraLAX.  DVT prophylaxis.  Subcutaneous Lovenox     All the records are reviewed and case discussed with Care Management/Social Worker. Management plans discussed with the patient, family and they are in agreement.  CODE STATUS: Full Code  TOTAL TIME TAKING CARE OF THIS PATIENT: 34 minutes.   More than 50% of the time was spent in counseling/coordination of care: YES  POSSIBLE D/C IN 2 DAYS, DEPENDING ON CLINICAL CONDITION.   Rakesh Dutko M.D on 06/04/2019 at 2:05 PM  Between 7am to 6pm - Pager - [phone]  After 6pm go to [email or web address] - Proofreader  [facility]  Office  [phone]  CC: Primary care physician; Patient, No Pcp Per  Note: This dictation was prepared with Dragon dictation along with smaller phrase technology. Any transcriptional errors that result from this process are unintentional.

## 2019-06-04 NOTE — H&P (Addendum)
[facility] [place] NAME: Kathleen Glass    MR#:  [id number]  DATE OF BIRTH:  Mar 12, 1996  DATE OF ADMISSION:  06/03/2019  PRIMARY CARE PHYSICIAN: Patient, No Pcp Per   REQUESTING/REFERRING PHYSICIAN: Duffy Bruce, MD  CHIEF COMPLAINT:   Chief Complaint  Patient presents with  . Fever    HISTORY OF PRESENT ILLNESS:  Kathleen Glass  is a 23 y.o. Caucasian female with a known history of depression, recurrent UTI and pyelonephritis, who presented to the emergency room with acute onset of fever and chills since yesterday with associated nausea and vomiting today, left lower back pain that extended across her lower back.  She was seen yesterday for similar symptoms and had urine and blood cultures which both came back positive for E. coli and Enterobacter species.  She was discharged on on p.o. Keflex.  She denied any diarrhea.  No bilious vomitus or hematemesis.  She denied any dysuria, oliguria or hematuria.  She admitted to foul-smelling urine however and has been having urinary frequency and urgency.  Her last episode of pyelonephritis was last year.  Upon presentation to the emergency room, her temperature was 101.6 with blood pressure of 135/75, respiratory to 22 and pulse of 130.  Pulse oximetry was 98% on room air.  Labs were remarkable for significant leukocytosis of 37.5 with neutrophilia and borderline potassium of 3.5 with a CO2 of 18 and positive UA for UTI.  Renal ultrasound showed no obstructive changes with increased echogenicity in the right kidney. EKG showed sinus tachycardia with a rate of 107  She was given 2 L bolus of IV normal saline, 30 mg of IV Toradol, 4 mg IV morphine sulfate and 4 mg of IV Zofran as well as 650 mg p.o. Tylenol and 1 g of IV meropenem.  She will be admitted to medical colleague monitored bed for further evaluation and management. PAST MEDICAL HISTORY:  1.  Depression 2.  Recurrent UTI 3.  Pyelonephritis  in 2019  PAST SURGICAL HISTORY:   Past Surgical History:  Procedure Laterality Date  . BREAST SURGERY Left 06-12-13   benign fibroadenoma    SOCIAL HISTORY:   Social History   Tobacco Use  . Smoking status: Former Smoker    Packs/day: 0.50    Types: Cigarettes  . Smokeless tobacco: Never Used  Substance Use Topics  . Alcohol use: No    FAMILY HISTORY:   Family History  Problem Relation Age of Onset  . Breast cancer Other   . Breast cancer Paternal Aunt        great aunt  Possible diabetes mellitus  DRUG ALLERGIES:  No Known Allergies  REVIEW OF SYSTEMS:   ROS As per history of present illness. All pertinent systems were reviewed above. Constitutional,  HEENT, cardiovascular, respiratory, GI, GU, musculoskeletal, neuro, psychiatric, endocrine,  integumentary and hematologic systems were reviewed and are otherwise  negative/unremarkable except for positive findings mentioned above in the HPI.   MEDICATIONS AT HOME:   Prior to Admission medications   Medication Sig Start Date End Date Taking? Authorizing Provider  acetaminophen (TYLENOL) 500 MG tablet Take 500 mg by mouth every 6 (six) hours as needed for mild pain, fever or headache.   Yes [provider]  cephALEXin (KEFLEX) 500 MG capsule Take 1 capsule (500 mg total) by mouth 4 (four) times daily for 12 days. 06/03/19 06/15/19 Yes Hinda Kehr, MD  docusate sodium (COLACE) 100 MG capsule Take 1  tablet once or twice daily as needed for constipation while taking narcotic pain medicine 06/03/19  Yes Hinda Kehr, MD  etonogestrel (NEXPLANON) 68 MG IMPL implant 1 each by Subdermal route once.   Yes [provider]  HYDROcodone-acetaminophen (NORCO/VICODIN) 5-325 MG tablet Take 2 tablets by mouth every 6 (six) hours as needed for moderate pain or severe pain. 06/03/19  Yes Hinda Kehr, MD  ondansetron (ZOFRAN ODT) 4 MG disintegrating tablet Allow 1-2 tablets to dissolve in your mouth every 8 hours as  needed for nausea/vomiting 06/03/19  Yes Hinda Kehr, MD  sertraline (ZOLOFT) 50 MG tablet Take 1 tablet (50 mg total) by mouth daily. Patient not taking: Reported on 06/04/2019 01/04/19   Diona Fanti, CNM      VITAL SIGNS:  Blood pressure 116/70, pulse (!) 114, temperature 99.8 F (37.7 C), temperature source Oral, resp. rate 20, height 5\' 2"  (1.575 m), weight 61.4 kg, SpO2 100 %, not currently breastfeeding.  PHYSICAL EXAMINATION:  Physical Exam  GENERAL:  23 y.o.-year-old Caucasian female patient lying in the bed with no acute distress.  EYES: Pupils equal, round, reactive to light and accommodation. No scleral icterus. Extraocular muscles intact.  HEENT: Head atraumatic, normocephalic. Oropharynx and nasopharynx clear.  NECK:  Supple, no jugular venous distention. No thyroid enlargement, no tenderness.  LUNGS: Normal breath sounds bilaterally, no wheezing, rales,rhonchi or crepitation. No use of accessory muscles of respiration.  CARDIOVASCULAR: Regular rate and rhythm, S1, S2 normal. No murmurs, rubs, or gallops.  ABDOMEN: Soft, nondistended, nontender. Bowel sounds present. No organomegaly or mass.  She has bilateral CVA tenderness left more than right. EXTREMITIES: No pedal edema, cyanosis, or clubbing.  NEUROLOGIC: Cranial nerves II through XII are intact. Muscle strength 5/5 in all extremities. Sensation intact. Gait not checked.  PSYCHIATRIC: The patient is alert and oriented x 3.  Normal affect and good eye contact. SKIN: No obvious rash, lesion, or ulcer.   LABORATORY PANEL:   CBC Recent Labs  Lab 06/03/19 2213  WBC 37.5*  HGB 12.4  HCT 37.6  PLT 243   ------------------------------------------------------------------------------------------------------------------  Chemistries  Recent Labs  Lab 06/03/19 2213  NA 136  K 3.5  CL 108  CO2 18*  GLUCOSE 76  BUN 11  CREATININE 0.65  CALCIUM 8.9  AST 16  ALT 16  ALKPHOS 71  BILITOT 0.8    ------------------------------------------------------------------------------------------------------------------  Cardiac Enzymes No results for input(s): TROPONINI in the last 168 hours. ------------------------------------------------------------------------------------------------------------------  RADIOLOGY:  Dg Abdomen 1 View  Result Date: 06/03/2019 CLINICAL DATA:  Left flank pain.  Urinary tract infection. EXAM: ABDOMEN - 1 VIEW COMPARISON:  CT abdomen pelvis 06/27/2018 FINDINGS: Gas is demonstrated within nondilated loops of large and small bowel in a nonobstructed pattern. Large amount of stool within the cecum and ascending colon. No definite radiopaque calcification within the expected location of the kidneys or ureters. Osseous structures unremarkable. IMPRESSION: No acute process within the abdomen. Large amount of stool within the cecum and ascending colon. Electronically Signed   By: Lovey Newcomer M.D.   On: 06/03/2019 06:00   US Renal  Result Date: 06/03/2019 CLINICAL DATA:  Possible obstruction EXAM: RENAL / URINARY TRACT ULTRASOUND COMPLETE COMPARISON:  None. FINDINGS: Right Kidney: Renal measurements: 12.6 x 5.4 x 6.4 cm. = volume: 228 mL. Mild increased echogenicity is noted. No hydronephrosis is seen. Left Kidney: Renal measurements: 11.3 x 7.0 x 6.1 cm. = volume: 252 mL. Echogenicity within normal limits. No mass or hydronephrosis visualized. Bladder: Appears normal for  degree of bladder distention. IMPRESSION: No obstructive changes are noted. Mild increased echogenicity is noted within the right kidney. Electronically Signed   By: Inez Catalina M.D.   On: 06/03/2019 23:30      IMPRESSION AND PLAN:   1.  Acute pyelonephritis with subsequent bacteremia and sepsis.  The patient will be admitted to a medical management.  We will continue IV antibiotics with IV meropenem.  Will follow further sensitivity results of grown E. coli and Enterobacter species on her urine and blood  cultures.  2.  Depression.  We will continue Zoloft  3.  DVT prophylaxis.  Subcutaneous Lovenox   All the records are reviewed and case discussed with ED provider. The plan of care was discussed in details with the patient (and family). I answered all questions. The patient agreed to proceed with the above mentioned plan. Further management will depend upon hospital course.   CODE STATUS: Full code  TOTAL TIME TAKING CARE OF THIS PATIENT: 15minutes.    Christel Mormon M.D on 06/04/2019 at 1:53 AM  Pager - [phone]  After 6pm go to [email or web address] - Proofreader  [facility]  Office  [phone]  CC: Primary care physician; Patient, No Pcp Per   Note: This dictation was prepared with Dragon dictation along with smaller phrase technology. Any transcriptional errors that result from this process are unintentional.

## 2019-06-04 NOTE — Progress Notes (Signed)
PHARMACY -  BRIEF ANTIBIOTIC NOTE   Pharmacy has received consult(s) for meropenem from an ED provider.  The patient's profile has been reviewed for ht/wt/allergies/indication/available labs.    One time order(s) placed for meropenem -- this patient was previously here on 08/2 for back pain and found to have pyelo, but became better after fluids and initial IV abx was d/c home on keflex. Patient then returned to triage w/ same issues--BCID was called and I called Dr. Cinda Quest and spoke to him about this patient and suggested that patient should be brought back and started on meropenem stat (see BCID note)  Further antibiotics/pharmacy consults should be ordered by admitting physician if indicated.                       Thank you,  Tobie Lords, PharmD, BCPS Clinical Pharmacist 06/04/2019  4:35 AM

## 2019-06-04 NOTE — Progress Notes (Signed)
   06/04/19 1700  Clinical Encounter Type  Visited With Other (Comment)  Visit Type Initial  Referral From Nurse  Consult/Referral To Chaplain  Spiritual Encounters  Spiritual Needs Other (Comment)  Chaplain received OR for AD. Chaplain called patient to see if she wanted to complete AD. The patient did not answer. Chaplain will follow up

## 2019-06-04 NOTE — Progress Notes (Signed)
Per ED RN Ena Dawley, telemetry monitor does not need to be placed at this time. Instructed that MD wanted to reassess need with next set of morning vitals. If patient is still tachycardic at that time will place on monitor. If patient is NSR will discuss with MD about D/Cing order.

## 2019-06-04 NOTE — Progress Notes (Signed)
PHARMACY - PHYSICIAN COMMUNICATION CRITICAL VALUE ALERT - BLOOD CULTURE IDENTIFICATION (BCID)  Results for orders placed or performed during the hospital encounter of 06/03/19  Blood Culture ID Panel (Reflexed) (Collected: 06/03/2019  2:56 AM)  Result Value Ref Range   Enterococcus species NOT DETECTED NOT DETECTED   Listeria monocytogenes NOT DETECTED NOT DETECTED   Staphylococcus species NOT DETECTED NOT DETECTED   Staphylococcus aureus (BCID) NOT DETECTED NOT DETECTED   Streptococcus species NOT DETECTED NOT DETECTED   Streptococcus agalactiae NOT DETECTED NOT DETECTED   Streptococcus pneumoniae NOT DETECTED NOT DETECTED   Streptococcus pyogenes NOT DETECTED NOT DETECTED   Acinetobacter baumannii NOT DETECTED NOT DETECTED   Enterobacteriaceae species DETECTED (A) NOT DETECTED   Enterobacter cloacae complex NOT DETECTED NOT DETECTED   Escherichia coli DETECTED (A) NOT DETECTED   Klebsiella oxytoca NOT DETECTED NOT DETECTED   Klebsiella pneumoniae NOT DETECTED NOT DETECTED   Proteus species NOT DETECTED NOT DETECTED   Serratia marcescens NOT DETECTED NOT DETECTED   Carbapenem resistance NOT DETECTED NOT DETECTED   Haemophilus influenzae NOT DETECTED NOT DETECTED   Neisseria meningitidis NOT DETECTED NOT DETECTED   Pseudomonas aeruginosa NOT DETECTED NOT DETECTED   Candida albicans NOT DETECTED NOT DETECTED   Candida glabrata NOT DETECTED NOT DETECTED   Candida krusei NOT DETECTED NOT DETECTED   Candida parapsilosis NOT DETECTED NOT DETECTED   Candida tropicalis NOT DETECTED NOT DETECTED    Name of physician (or Provider) Contacted: Kathleen Falco, NP   Changes to prescribed antibiotics required: No , continue meropenem  Kathleen Glass 06/04/2019  8:56 PM

## 2019-06-04 NOTE — ED Notes (Signed)
Per admitting Dr Sidney Ace, continuous cardiac monitoring not neccessary att

## 2019-06-05 LAB — BASIC METABOLIC PANEL
Anion gap: 6 (ref 5–15)
BUN: 7 mg/dL (ref 6–20)
CO2: 18 mmol/L — ABNORMAL LOW (ref 22–32)
Calcium: 7.9 mg/dL — ABNORMAL LOW (ref 8.9–10.3)
Chloride: 112 mmol/L — ABNORMAL HIGH (ref 98–111)
Creatinine, Ser: 0.44 mg/dL (ref 0.44–1.00)
GFR calc Af Amer: >60 mL/min (ref >60)
GFR calc non Af Amer: >60 mL/min (ref >60)
Glucose, Bld: 84 mg/dL (ref 70–99)
Potassium: 3.6 mmol/L (ref 3.5–5.1)
Sodium: 136 mmol/L (ref 135–145)

## 2019-06-05 LAB — URINE CULTURE
Culture: >=100000 — AB
Special Requests: NORMAL

## 2019-06-05 LAB — CBC WITH DIFFERENTIAL/PLATELET
Abs Immature Granulocytes: 0.15 10*3/uL — ABNORMAL HIGH (ref 0.00–0.07)
Basophils Absolute: 0 10*3/uL (ref 0.0–0.1)
Basophils Relative: 0 %
Eosinophils Absolute: 0.1 10*3/uL (ref 0.0–0.5)
Eosinophils Relative: 0 %
HCT: 28.8 % — ABNORMAL LOW (ref 36.0–46.0)
Hemoglobin: 9.5 g/dL — ABNORMAL LOW (ref 12.0–15.0)
Immature Granulocytes: 1 %
Lymphocytes Relative: 13 %
Lymphs Abs: 2.6 10*3/uL (ref 0.7–4.0)
MCH: 27 pg (ref 26.0–34.0)
MCHC: 33 g/dL (ref 30.0–36.0)
MCV: 81.8 fL (ref 80.0–100.0)
Monocytes Absolute: 2.2 10*3/uL — ABNORMAL HIGH (ref 0.1–1.0)
Monocytes Relative: 11 %
Neutro Abs: 14.9 10*3/uL — ABNORMAL HIGH (ref 1.7–7.7)
Neutrophils Relative %: 75 %
Platelets: 193 10*3/uL (ref 150–400)
RBC: 3.52 MIL/uL — ABNORMAL LOW (ref 3.87–5.11)
RDW: 14.2 % (ref 11.5–15.5)
WBC: 19.9 10*3/uL — ABNORMAL HIGH (ref 4.0–10.5)
nRBC: 0 % (ref 0.0–0.2)

## 2019-06-05 LAB — HIV ANTIBODY (ROUTINE TESTING W REFLEX): HIV Screen 4th Generation wRfx: NONREACTIVE

## 2019-06-05 LAB — MAGNESIUM: Magnesium: 1.8 mg/dL (ref 1.7–2.4)

## 2019-06-05 MED ORDER — CEFAZOLIN SODIUM-DEXTROSE 2-4 GM/100ML-% IV SOLN
2.0000 g | Freq: Three times a day (TID) | INTRAVENOUS | Status: DC
  Administered 2019-06-05 – 2019-06-07 (×6): 2 g via INTRAVENOUS
  Filled 2019-06-05 (×8): qty 100

## 2019-06-05 MED ORDER — HYDROCODONE-ACETAMINOPHEN 5-325 MG PO TABS
2.0000 | ORAL_TABLET | ORAL | Status: DC | PRN
  Administered 2019-06-05 – 2019-06-07 (×9): 2 via ORAL
  Filled 2019-06-05 (×10): qty 2

## 2019-06-05 MED ORDER — IBUPROFEN 400 MG PO TABS
400.0000 mg | ORAL_TABLET | Freq: Four times a day (QID) | ORAL | Status: DC | PRN
  Administered 2019-06-06: 400 mg via ORAL
  Filled 2019-06-05: qty 1

## 2019-06-05 NOTE — Progress Notes (Signed)
Ch visited w/ pt as a f/u to complete AD education. Pt was alert yet presented to be weak. Pt stated that she recently came to the ED because she was not able to move on her own and had significant pain throughout her body. Pt became aware that she had a UTI but was d/c from ER only to have to come back because the pain was unbearable and the pt was challenged with filling the prescription die to the cost. Pt shared that she has be informed that she has kidney infection and is sepsis. Ch provided compassionate presence as pt laments about her current condition. 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 Pt has the support of her significant other and mom.  Ch provided pt with an AD packet and informed her of how to complete the document when she is d/c. Pt was appreciative of visit.   No further needs at this time.

## 2019-06-05 NOTE — Clinical Social Work Note (Signed)
Financial counselor said patient should qualify for full Medicaid. She will submit application once patient has discharged home.  Kathleen Glass, [place]

## 2019-06-05 NOTE — Progress Notes (Signed)
[facility] [place] NAME: Langley Flatley    MR#:  [id number]  DATE OF BIRTH:  [birth year]  SUBJECTIVE:  CHIEF COMPLAINT:   Chief Complaint  Patient presents with  . Fever   No new complaint this morning.  Still has some mild left flank pains.  Temperature 100.2 yesterday.  Being treated for pyelonephritis.  REVIEW OF SYSTEMS:  Review of Systems  Constitutional: Negative for chills.       Fevers last night.  No fever this morning.  HENT: Negative for hearing loss and tinnitus.   Eyes: Negative for blurred vision and double vision.  Respiratory: Negative for cough and hemoptysis.   Cardiovascular: Negative for chest pain and palpitations.  Gastrointestinal: Negative for heartburn, nausea and vomiting.       Left flank pains  Genitourinary: Negative for dysuria and urgency.  Musculoskeletal: Negative for myalgias and neck pain.       Left flank pains  Neurological: Negative for dizziness and headaches.  Psychiatric/Behavioral: Negative for depression and hallucinations.    DRUG ALLERGIES:  No Known Allergies VITALS:  Blood pressure (!) 124/95, pulse 76, temperature 97.6 F (36.4 C), temperature source Oral, resp. rate 17, height 5\' 2"  (1.575 m), weight 61.4 kg, SpO2 98 %, not currently breastfeeding. PHYSICAL EXAMINATION:  Physical Exam   GENERAL:  23 y.o.-year-old Caucasian female patient lying in the bed with no acute distress.  EYES: Pupils equal, round, reactive to light and accommodation. No scleral icterus. Extraocular muscles intact.  HEENT: Head atraumatic, normocephalic. Oropharynx and nasopharynx clear.  NECK:  Supple, no jugular venous distention. No thyroid enlargement, no tenderness.  LUNGS: Normal breath sounds bilaterally, no wheezing, rales,rhonchi or crepitation. No use of accessory muscles of respiration.  CARDIOVASCULAR: Regular rate and rhythm, S1, S2 normal. No murmurs, rubs, or gallops.  ABDOMEN: Soft,  nondistended, nontender. Bowel sounds present. No organomegaly or mass.  She has mild bilateral CVA tenderness left more than right. EXTREMITIES: No pedal edema, cyanosis, or clubbing.  NEUROLOGIC: Cranial nerves II through XII are intact. Muscle strength 5/5 in all extremities. Sensation intact. Gait not checked.  PSYCHIATRIC: The patient is alert and oriented x 3.  Normal affect and good eye contact. SKIN: No obvious rash, lesion, or ulcer.  LABORATORY PANEL:  Female CBC Recent Labs  Lab 06/05/19 0426  WBC 19.9*  HGB 9.5*  HCT 28.8*  PLT 193   ------------------------------------------------------------------------------------------------------------------ Chemistries  Recent Labs  Lab 06/03/19 2213  06/05/19 0426  NA 136   < > 136  K 3.5   < > 3.6  CL 108   < > 112*  CO2 18*   < > 18*  GLUCOSE 76   < > 84  BUN 11   < > 7  CREATININE 0.65   < > 0.44  CALCIUM 8.9   < > 7.9*  MG  --   --  1.8  AST 16  --   --   ALT 16  --   --   ALKPHOS 71  --   --   BILITOT 0.8  --   --    < > = values in this interval not displayed.   RADIOLOGY:  No results found. ASSESSMENT AND PLAN:   1.  Acute pyelonephritis with subsequent bacteremia and sepsis.   Patient on IV fluids and empiric IV antibiotics with meropenem initially.   Urine culture and blood culture growing E. coli.  Noted significant improvement in leukocytosis  with white count down from 32,000-19,000 this morning.   De-escalate antibiotics from meropenem to Ancef with pharmacist assisting with dosing  Still has some flank pains.  2.  Depression.  We will continue Zoloft  3.    Constipation Has had 2 bowel movements today with laxatives. Being cautious with narcotics.  DVT prophylaxis.  Subcutaneous Lovenox  All the records are reviewed and case discussed with Care Management/Social Worker. Management plans discussed with the patient, family and they are in agreement.  CODE STATUS: Full Code  TOTAL TIME TAKING  CARE OF THIS PATIENT: 33 minutes.   More than 50% of the time was spent in counseling/coordination of care: YES  POSSIBLE D/C IN 2 DAYS, DEPENDING ON CLINICAL CONDITION.   Jeran Hiltz M.D on 06/05/2019 at 1:54 PM  Between 7am to 6pm - Pager - [phone]  After 6pm go to [email or web address] - Proofreader  [facility]  Office  [phone]  CC: Primary care physician; Patient, No Pcp Per  Note: This dictation was prepared with Dragon dictation along with smaller phrase technology. Any transcriptional errors that result from this process are unintentional.

## 2019-06-06 LAB — CULTURE, BLOOD (ROUTINE X 2)

## 2019-06-06 LAB — BASIC METABOLIC PANEL
Anion gap: 8 (ref 5–15)
BUN: 6 mg/dL (ref 6–20)
CO2: 21 mmol/L — ABNORMAL LOW (ref 22–32)
Calcium: 8 mg/dL — ABNORMAL LOW (ref 8.9–10.3)
Chloride: 108 mmol/L (ref 98–111)
Creatinine, Ser: 0.55 mg/dL (ref 0.44–1.00)
GFR calc Af Amer: >60 mL/min (ref >60)
GFR calc non Af Amer: >60 mL/min (ref >60)
Glucose, Bld: 93 mg/dL (ref 70–99)
Potassium: 3.3 mmol/L — ABNORMAL LOW (ref 3.5–5.1)
Sodium: 137 mmol/L (ref 135–145)

## 2019-06-06 LAB — CBC
HCT: 28.5 % — ABNORMAL LOW (ref 36.0–46.0)
Hemoglobin: 9.5 g/dL — ABNORMAL LOW (ref 12.0–15.0)
MCH: 27.1 pg (ref 26.0–34.0)
MCHC: 33.3 g/dL (ref 30.0–36.0)
MCV: 81.2 fL (ref 80.0–100.0)
Platelets: 210 10*3/uL (ref 150–400)
RBC: 3.51 MIL/uL — ABNORMAL LOW (ref 3.87–5.11)
RDW: 14 % (ref 11.5–15.5)
WBC: 13.1 10*3/uL — ABNORMAL HIGH (ref 4.0–10.5)
nRBC: 0 % (ref 0.0–0.2)

## 2019-06-06 LAB — MAGNESIUM: Magnesium: 1.9 mg/dL (ref 1.7–2.4)

## 2019-06-06 MED ORDER — GUAIFENESIN 100 MG/5ML PO SOLN
5.0000 mL | ORAL | Status: DC | PRN
  Filled 2019-06-06: qty 5

## 2019-06-06 MED ORDER — POTASSIUM CHLORIDE 20 MEQ PO PACK
40.0000 meq | PACK | Freq: Once | ORAL | Status: AC
  Administered 2019-06-06: 40 meq via ORAL
  Filled 2019-06-06: qty 2

## 2019-06-06 NOTE — Plan of Care (Signed)
Patient doing well today.  Afebrile this shift.  Pain has been better for her today.  Patient states she does get dizzy sometimes when she gets up, MD aware.  No significant changes.  Patient stated she has been keeping her family updated on her care and she doesn't want me to call them.

## 2019-06-06 NOTE — Progress Notes (Signed)
[place] [facility] [place] NAME: Kathleen Glass    MR#:  [id number]  DATE OF BIRTH:  03/02/1996  SUBJECTIVE:  CHIEF COMPLAINT:   Chief Complaint  Patient presents with  . Fever   No new complaint this morning.  Patient had fever with temperature of 101 point self with overnight.  Abdominal pains improved.  Had bowel movements yesterday.    REVIEW OF SYSTEMS:  Review of Systems  Constitutional: Negative for chills.       Fevers last night.  No fever this morning.  HENT: Negative for hearing loss and tinnitus.   Eyes: Negative for blurred vision and double vision.  Respiratory: Negative for cough and hemoptysis.   Cardiovascular: Negative for chest pain and palpitations.  Gastrointestinal: Negative for heartburn, nausea and vomiting.       Left flank pains  Genitourinary: Negative for dysuria and urgency.  Musculoskeletal: Negative for myalgias and neck pain.       Left flank pains  Neurological: Negative for dizziness and headaches.  Psychiatric/Behavioral: Negative for depression and hallucinations.    DRUG ALLERGIES:  No Known Allergies VITALS:  Blood pressure 135/88, pulse (!) 53, temperature 98.2 F (36.8 C), temperature source Oral, resp. rate 16, height 5\' 2"  (1.575 m), weight 61.4 kg, SpO2 96 %, not currently breastfeeding. PHYSICAL EXAMINATION:  Physical Exam   GENERAL:  23 y.o.-year-old Caucasian female patient lying in the bed with no acute distress.  EYES: Pupils equal, round, reactive to light and accommodation. No scleral icterus. Extraocular muscles intact.  HEENT: Head atraumatic, normocephalic. Oropharynx and nasopharynx clear.  NECK:  Supple, no jugular venous distention. No thyroid enlargement, no tenderness.  LUNGS: Normal breath sounds bilaterally, no wheezing, rales,rhonchi or crepitation. No use of accessory muscles of respiration.  CARDIOVASCULAR: Regular rate and rhythm, S1, S2 normal. No murmurs, rubs, or  gallops.  ABDOMEN: Soft, nondistended, nontender. Bowel sounds present. No organomegaly or mass.  She has mild bilateral CVA tenderness left more than right. EXTREMITIES: No pedal edema, cyanosis, or clubbing.  NEUROLOGIC: Cranial nerves II through XII are intact. Muscle strength 5/5 in all extremities. Sensation intact. Gait not checked.  PSYCHIATRIC: The patient is alert and oriented x 3.  Normal affect and good eye contact. SKIN: No obvious rash, lesion, or ulcer.  LABORATORY PANEL:  Female CBC Recent Labs  Lab 06/06/19 0633  WBC 13.1*  HGB 9.5*  HCT 28.5*  PLT 210   ------------------------------------------------------------------------------------------------------------------ Chemistries  Recent Labs  Lab 06/03/19 2213  06/06/19 0633  NA 136   < > 137  K 3.5   < > 3.3*  CL 108   < > 108  CO2 18*   < > 21*  GLUCOSE 76   < > 93  BUN 11   < > 6  CREATININE 0.65   < > 0.55  CALCIUM 8.9   < > 8.0*  MG  --    < > 1.9  AST 16  --   --   ALT 16  --   --   ALKPHOS 71  --   --   BILITOT 0.8  --   --    < > = values in this interval not displayed.   RADIOLOGY:  No results found. ASSESSMENT AND PLAN:   1.  Acute pyelonephritis with subsequent bacteremia and sepsis.   Patient on IV fluids and empiric IV antibiotics with meropenem initially.   Urine culture and blood culture growing E.  coli.  Noted significant improvement in leukocytosis with white count down from 32,000-13,000 this morning.   De-escalate antibiotics from meropenem to Ancef with pharmacist assisting with dosing  Still has some flank pains.  2.  Depression.  We will continue Zoloft  3.    Constipation Has had 2 bowel movements with laxatives yesterday. Being cautious with narcotics.  DVT prophylaxis.  Subcutaneous Lovenox  All the records are reviewed and case discussed with Care Management/Social Worker. Management plans discussed with the patient, family and they are in agreement.  CODE STATUS:  Full Code  TOTAL TIME TAKING CARE OF THIS PATIENT: 32 minutes.   More than 50% of the time was spent in counseling/coordination of care: YES  POSSIBLE D/C IN 2 DAYS, DEPENDING ON CLINICAL CONDITION.   Avyn Aden M.D on 06/06/2019 at 3:00 PM  Between 7am to 6pm - Pager - [phone]  After 6pm go to [email or web address] - Proofreader  [facility]  Office  [phone]  CC: Primary care physician; Patient, No Pcp Per  Note: This dictation was prepared with Dragon dictation along with smaller phrase technology. Any transcriptional errors that result from this process are unintentional.

## 2019-06-07 LAB — CBC
HCT: 28.6 % — ABNORMAL LOW (ref 36.0–46.0)
Hemoglobin: 9.6 g/dL — ABNORMAL LOW (ref 12.0–15.0)
MCH: 27.1 pg (ref 26.0–34.0)
MCHC: 33.6 g/dL (ref 30.0–36.0)
MCV: 80.8 fL (ref 80.0–100.0)
Platelets: 236 10*3/uL (ref 150–400)
RBC: 3.54 MIL/uL — ABNORMAL LOW (ref 3.87–5.11)
RDW: 13.9 % (ref 11.5–15.5)
WBC: 10.6 10*3/uL — ABNORMAL HIGH (ref 4.0–10.5)
nRBC: 0 % (ref 0.0–0.2)

## 2019-06-07 LAB — BASIC METABOLIC PANEL
Anion gap: 9 (ref 5–15)
BUN: 7 mg/dL (ref 6–20)
CO2: 22 mmol/L (ref 22–32)
Calcium: 8.1 mg/dL — ABNORMAL LOW (ref 8.9–10.3)
Chloride: 109 mmol/L (ref 98–111)
Creatinine, Ser: 0.37 mg/dL — ABNORMAL LOW (ref 0.44–1.00)
GFR calc Af Amer: >60 mL/min (ref >60)
GFR calc non Af Amer: >60 mL/min (ref >60)
Glucose, Bld: 81 mg/dL (ref 70–99)
Potassium: 3.7 mmol/L (ref 3.5–5.1)
Sodium: 140 mmol/L (ref 135–145)

## 2019-06-07 LAB — MAGNESIUM: Magnesium: 1.9 mg/dL (ref 1.7–2.4)

## 2019-06-07 MED ORDER — IBUPROFEN 400 MG PO TABS: 400.0000 mg | ORAL_TABLET | Freq: Three times a day (TID) | ORAL | 0 refills | Status: DC | PRN

## 2019-06-07 MED ORDER — CEFUROXIME AXETIL 500 MG PO TABS: 500.0000 mg | ORAL_TABLET | Freq: Two times a day (BID) | ORAL | 0 refills | Status: AC

## 2019-06-07 NOTE — Progress Notes (Signed)
Per discussion in Progression, MD to discharge pt. Awaiting orders.

## 2019-06-07 NOTE — Discharge Summary (Signed)
[place] at [place] NAME: Kathleen Glass    MR#:  [id number]  DATE OF BIRTH:  Sep 18, 1996  DATE OF ADMISSION:  06/03/2019   ADMITTING PHYSICIAN: Christel Mormon, MD  DATE OF DISCHARGE: 06/07/2019  PRIMARY CARE PHYSICIAN: Patient, No Pcp Per   ADMISSION DIAGNOSIS:  Fever; chills DISCHARGE DIAGNOSIS:  Active Problems:   Sepsis ([place])  SECONDARY DIAGNOSIS:  History reviewed. No pertinent past medical history. HOSPITAL COURSE:  Chief complaint; fevers  History of presenting complaint; Kathleen Glass  is a 23 y.o. Caucasian female with a known history of depression, recurrent UTI and pyelonephritis, who presented to the emergency room with acute onset of fever and chills since yesterday with associated nausea and vomiting today, left lower back pain that extended across her lower back.  She was seen 1 day prior to this admission for similar symptoms and had urine and blood cultures which both came back positive for E. coli and Enterobacter species.  She was discharged on on p.o. Keflex.  Presented back with fevers and flank pains.  Diagnosed with acute pyelonephritis.  Admitted to medical service.  Hospital course; 1. Acute pyelonephritis with subsequent bacteremia and sepsis.  Patient was treated with IV meropenem initially.  Adequately hydrated with IV fluids.  Significantly improved clinically.  Urine culture and blood culture growing E. coli.  Noted significant improvement in leukocytosis with white count down from 32,000-10,600 this morning.  De-escalate antibiotics from meropenem to Ancef previously.  Patient has remained afebrile for more than 24 hours.  Flank pains significantly improved. Renal ultrasound showed no obstructive changes with increased echogenicity in the right kidney.  Patient clinically and hemodynamically stable.  Plans for discharge on p.o. antibiotics based on sensitivities with cefuroxime for next 10 days to complete total  of 2 weeks treatment duration.  2. Depression. We will continue Zoloft  3.   Constipation Has had 2 bowel movements with laxatives   DISCHARGE CONDITIONS:  Stable  CONSULTS OBTAINED:   DRUG ALLERGIES:  No Known Allergies DISCHARGE MEDICATIONS:   Allergies as of 06/07/2019   No Known Allergies     Medication List    STOP taking these medications   cephALEXin 500 MG capsule Commonly known as: KEFLEX     TAKE these medications   acetaminophen 500 MG tablet Commonly known as: TYLENOL Take 500 mg by mouth every 6 (six) hours as needed for mild pain, fever or headache.   cefUROXime 500 MG tablet Commonly known as: CEFTIN Take 1 tablet (500 mg total) by mouth 2 (two) times daily for 10 days.   docusate sodium 100 MG capsule Commonly known as: Colace Take 1 tablet once or twice daily as needed for constipation while taking narcotic pain medicine   HYDROcodone-acetaminophen 5-325 MG tablet Commonly known as: NORCO/VICODIN Take 2 tablets by mouth every 6 (six) hours as needed for moderate pain or severe pain.   ibuprofen 400 MG tablet Commonly known as: ADVIL Take 1 tablet (400 mg total) by mouth every 8 (eight) hours as needed for mild pain.   Nexplanon 68 MG Impl implant Generic drug: etonogestrel 1 each by Subdermal route once.   ondansetron 4 MG disintegrating tablet Commonly known as: Zofran ODT Allow 1-2 tablets to dissolve in your mouth every 8 hours as needed for nausea/vomiting   sertraline 50 MG tablet Commonly known as: ZOLOFT Take 1 tablet (50 mg total) by mouth daily.        DISCHARGE  INSTRUCTIONS:   DIET:  Regular diet DISCHARGE CONDITION:  Stable ACTIVITY:  Activity as tolerated OXYGEN:  Home Oxygen: No.  Oxygen Delivery: room air DISCHARGE LOCATION:  home   If you experience worsening of your admission symptoms, develop shortness of breath, life threatening emergency, suicidal or homicidal thoughts you must seek medical attention  immediately by calling 911 or calling your MD immediately  if symptoms less severe.  You Must read complete instructions/literature along with all the possible adverse reactions/side effects for all the Medicines you take and that have been prescribed to you. Take any new Medicines after you have completely understood and accpet all the possible adverse reactions/side effects.   Please note  You were cared for by a hospitalist during your hospital stay. If you have any questions about your discharge medications or the care you received while you were in the hospital after you are discharged, you can call the unit and asked to speak with the hospitalist on call if the hospitalist that took care of you is not available. Once you are discharged, your primary care physician will handle any further medical issues. Please note that NO REFILLS for any discharge medications will be authorized once you are discharged, as it is imperative that you return to your primary care physician (or establish a relationship with a primary care physician if you do not have one) for your aftercare needs so that they can reassess your need for medications and monitor your lab values.    On the day of Discharge:  VITAL SIGNS:  Blood pressure (!) 145/93, pulse 71, temperature 98.7 F (37.1 C), temperature source Oral, resp. rate 16, height 5\' 2"  (1.575 m), weight 61.4 kg, SpO2 96 %, not currently breastfeeding. PHYSICAL EXAMINATION:  GENERAL:  23 y.o.-year-old patient lying in the bed with no acute distress.  EYES: Pupils equal, round, reactive to light and accommodation. No scleral icterus. Extraocular muscles intact.  HEENT: Head atraumatic, normocephalic. Oropharynx and nasopharynx clear.  NECK:  Supple, no jugular venous distention. No thyroid enlargement, no tenderness.  LUNGS: Normal breath sounds bilaterally, no wheezing, rales,rhonchi or crepitation. No use of accessory muscles of respiration.  CARDIOVASCULAR: S1,  S2 normal. No murmurs, rubs, or gallops.  ABDOMEN: Soft, non-tender, non-distended. Bowel sounds present. No organomegaly or mass.  EXTREMITIES: No pedal edema, cyanosis, or clubbing.  NEUROLOGIC: Cranial nerves II through XII are intact. Muscle strength 5/5 in all extremities. Sensation intact. Gait not checked.  PSYCHIATRIC: The patient is alert and oriented x 3.  SKIN: No obvious rash, lesion, or ulcer.  DATA REVIEW:   CBC Recent Labs  Lab 06/07/19 0620  WBC 10.6*  HGB 9.6*  HCT 28.6*  PLT 236    Chemistries  Recent Labs  Lab 06/03/19 2213  06/07/19 0620  NA 136   < > 140  K 3.5   < > 3.7  CL 108   < > 109  CO2 18*   < > 22  GLUCOSE 76   < > 81  BUN 11   < > 7  CREATININE 0.65   < > 0.37*  CALCIUM 8.9   < > 8.1*  MG  --    < > 1.9  AST 16  --   --   ALT 16  --   --   ALKPHOS 71  --   --   BILITOT 0.8  --   --    < > = values in this interval not displayed.  Microbiology Results  Results for orders placed or performed during the hospital encounter of 06/03/19  Blood Culture (routine x 2)     Status: None (Preliminary result)   Collection Time: 06/03/19 10:16 PM   Specimen: BLOOD  Result Value Ref Range Status   Specimen Description BLOOD RIGHT FOREARM  Final   Special Requests   Final    BOTTLES DRAWN AEROBIC AND ANAEROBIC Blood Culture results may not be optimal due to an excessive volume of blood received in culture bottles   Culture   Final    NO GROWTH 4 DAYS Performed at [facility], [address] [place], [place] [address]    Report Status PENDING  Incomplete  Blood Culture (routine x 2)     Status: None (Preliminary result)   Collection Time: 06/03/19 10:40 PM   Specimen: BLOOD  Result Value Ref Range Status   Specimen Description BLOOD LEFT HAND  Final   Special Requests   Final    BOTTLES DRAWN AEROBIC AND ANAEROBIC Blood Culture adequate volume   Culture  Setup Time ANAEROBIC BOTTLE ONLY NO ORGANISMS SEEN   Final   Culture    Final    NO GROWTH 4 DAYS Performed at [facility], [address], [place], [place] [address]    Report Status PENDING  Incomplete  SARS CORONAVIRUS 2 Nasal Swab Aptima Multi Swab     Status: None   Collection Time: 06/04/19 12:06 AM   Specimen: Aptima Multi Swab; Nasal Swab  Result Value Ref Range Status   SARS Coronavirus 2 NEGATIVE NEGATIVE Final    Comment: (NOTE) SARS-CoV-2 target nucleic acids are NOT DETECTED. The SARS-CoV-2 RNA is generally detectable in upper and lower respiratory specimens during the acute phase of infection. Negative results do not preclude SARS-CoV-2 infection, do not rule out co-infections with other pathogens, and should not be used as the sole basis for treatment or other patient management decisions. Negative results must be combined with clinical observations, patient history, and epidemiological information. The expected result is Negative. Fact Sheet for Patients: SugarRoll.be Fact Sheet for Healthcare Providers: https://www.woods-mathews.com/ This test is not yet approved or cleared by the [place] FDA and  has been authorized for detection and/or diagnosis of SARS-CoV-2 by FDA under an Emergency Use Authorization (EUA). This EUA will remain  in effect (meaning this test can be used) for the duration of the COVID-19 declaration under Section 56 4(b)(1) of the Act, 21 U.S.C. section 360bbb-3(b)(1), unless the authorization is terminated or revoked sooner. Performed at [facility], [place] [address] [place],  [address]     RADIOLOGY:  No results found.   Management plans discussed with the patient, family and they are in agreement.  CODE STATUS: Full Code   TOTAL TIME TAKING CARE OF THIS PATIENT: 37 minutes.    Shareka Casale M.D on 06/07/2019 at 12:46 PM  Between 7am to 6pm - Pager - [phone]  After 6pm go to [email or web address] - Proofreader  [facility]  Office  [phone]  CC: Primary care physician; Patient, No Pcp Per   Note: This dictation was prepared with Dragon dictation along with smaller phrase technology. Any transcriptional errors that result from this process are unintentional.

## 2019-06-07 NOTE — Discharge Instructions (Signed)
Antibiotic Medicine, Adult ° °Antibiotic medicines treat infections caused by a type of germ called bacteria. They work by killing the bacteria that make you sick. °When do I need to take antibiotics? °You often need these medicines to treat bacterial infections, such as: °· A urinary tract infection (UTI). °· Strep throat. °· Meningitis. This affects the spinal cord and brain. °· A bad lung infection. °You may start the medicines while your doctor waits for tests to come back. When the tests come back, your doctor may change or stop your medicine. °When are antibiotics not needed? °You do not need these medicines for most common illnesses, such as: °· A cold. °· The flu. °· A sore throat. °Antibiotics are not always needed for all infections caused by bacteria. Do not ask for these medicines, or take them, when they are not needed. °What are the risks of taking antibiotics? °Most antibiotics can cause an infection called Clostridioides difficile (C. diff). This causes watery poop (diarrhea). Let your doctor know right away if: °· You have watery poop while taking an antibiotic. °· You have watery poop after you stop taking an antibiotic. The illness can happen weeks after you stop the medicine. °You also have a risk of getting an infection in the future that antibiotics cannot treat (antibiotic-resistant infection). This type of infection can be dangerous. °What else should I know about taking antibiotics? ° °· You need to take the entire prescription. °? Take the medicine for as long as told by your doctor. °? Do not stop taking it even if you start to feel better. °· Try not to miss any doses. If you miss a dose, call your doctor. °· Birth control pills may not work. If you take birth control pills: °? Keep on taking them. °? Use a second form of birth control, such as a condom. Do this for as long as told by your doctor. °· Ask your doctor: °? How long to wait in between doses. °? If you should take the medicine  with food. °? If there is anything you should stay away from while taking the antibiotic, such as: °? Food. °? Drinks. °? Medicines. °? If there are any side effects you should watch for. °· Only take the medicines that your doctor told you to take. Do not take medicines that were given to someone else. °· Drink a large glass of water with the medicine. °· Ask the pharmacist for a tool to measure the medicine, such as: °? A syringe. °? A cup. °? A spoon. °· Throw away any extra medicine. °Contact a doctor if: °· You get worse. °· You have new joint pain or muscle aches after starting the medicine. °· You have side effects from the medicine, such as: °? Stomach pain. °? Watery poop. °? Feeling sick to your stomach (nausea). °Get help right away if: °· You have signs of a very bad allergic reaction. If this happens, stop taking the medicine right away. Signs may include: °? Hives. These are raised, itchy, red bumps on the skin. °? Skin rash. °? Trouble breathing. °? Wheezing. °? Swelling. °? Feeling dizzy. °? Throwing up (vomiting). °· Your pee (urine) is dark, or is the color of blood. °· Your skin turns yellow. °· You bruise easily. °· You bleed easily. °· You have very bad watery poop and cramps in your belly. °· You have a very bad headache. °Summary °· Antibiotics are often used to treat infections caused by bacteria. °· Only   take these medicines when needed. °· Let your doctor know if you have watery poop while taking an antibiotic. °· You need to take the entire prescription. °This information is not intended to replace advice given to you by your health care provider. Make sure you discuss any questions you have with your health care provider. °Document Released: 07/27/2008 Document Revised: 11/24/2018 Document Reviewed: 10/20/2016 °Elsevier Patient Education © 2020 Elsevier Inc. ° °

## 2019-06-07 NOTE — Progress Notes (Signed)
Nsg Discharge Note  Admit Date:  06/03/2019 Discharge date: 06/07/2019   Kathleen Glass to be D/C'd Home per MD order.  AVS completed.  Copy for chart, and copy for patient signed, and dated. Patient/caregiver able to verbalize understanding.  Discharge Medication: Allergies as of 06/07/2019   No Known Allergies     Medication List    STOP taking these medications   cephALEXin 500 MG capsule Commonly known as: KEFLEX     TAKE these medications   acetaminophen 500 MG tablet Commonly known as: TYLENOL Take 500 mg by mouth every 6 (six) hours as needed for mild pain, fever or headache.   cefUROXime 500 MG tablet Commonly known as: CEFTIN Take 1 tablet (500 mg total) by mouth 2 (two) times daily for 10 days.   docusate sodium 100 MG capsule Commonly known as: Colace Take 1 tablet once or twice daily as needed for constipation while taking narcotic pain medicine   HYDROcodone-acetaminophen 5-325 MG tablet Commonly known as: NORCO/VICODIN Take 2 tablets by mouth every 6 (six) hours as needed for moderate pain or severe pain.   ibuprofen 400 MG tablet Commonly known as: ADVIL Take 1 tablet (400 mg total) by mouth every 8 (eight) hours as needed for mild pain.   Nexplanon 68 MG Impl implant Generic drug: etonogestrel 1 each by Subdermal route once.   ondansetron 4 MG disintegrating tablet Commonly known as: Zofran ODT Allow 1-2 tablets to dissolve in your mouth every 8 hours as needed for nausea/vomiting   sertraline 50 MG tablet Commonly known as: ZOLOFT Take 1 tablet (50 mg total) by mouth daily.       Discharge Assessment: Vitals:   06/07/19 0449 06/07/19 1235  BP: (!) 157/93 (!) 145/93  Pulse: 63 71  Resp: 18 16  Temp: 98.3 F (36.8 C) 98.7 F (37.1 C)  SpO2: 98% 96%   Skin clean, dry and intact without evidence of skin break down, no evidence of skin tears noted. IV catheter discontinued intact. Site without signs and symptoms of complications - no redness or  edema noted at insertion site, patient denies c/o pain - only slight tenderness at site.  Dressing with slight pressure applied.  D/c Instructions-Education: Discharge instructions given to patient/family with verbalized understanding. D/c education completed with patient/family including follow up instructions, medication list, d/c activities limitations if indicated, with other d/c instructions as indicated by MD - patient able to verbalize understanding, all questions fully answered. Patient instructed to return to ED, call 911, or call MD for any changes in condition.  Patient escorted via [place], and D/C home via private auto.  Eda Keys, RN 06/07/2019 1:13 PM

## 2019-06-07 NOTE — Progress Notes (Signed)
Pt received her meds from SW. Pt taken via WC to ER entrance per her request.

## 2019-06-07 NOTE — TOC Transition Note (Signed)
Transition of Care [facility]) - CM/SW Discharge Note   Patient Details  Name: Kathleen Glass MRN: [id number] Date of Birth: [birth year]  Transition of [facility] CM/SW Contact:  Candie Chroman, LCSW Phone Number: 06/07/2019, 2:08 PM   Clinical Narrative:  CSW met with patient, introduced role, and explained that discharge planning would be discussed. Patient confirmed she does not have a PCP. She last following up at [facility] in January/February 2019. CSW provided booklet, "Your Guide to [facility] [employer] [facility]" and intake form for [employer]. Patient unable to afford medications at this time. Medication assistance will fill Ceftin and Advil which were the only prescriptions that were printed out. They should be ready by 3:00 and CSW assistant will pick them up for her. Financial counseling is working on [employer] application and said it should be active in about 30 days. No further concerns. Patient has orders to discharge home today. CSW signing off.  Final next level of care: Home/Self Care Barriers to Discharge: Barriers Resolved   Patient Goals and CMS Choice     Choice offered to / list presented to : NA  Discharge Placement                       Discharge Plan and Services   Discharge Planning Services: Medication Assistance                                 Social Determinants of Health (SDOH) Interventions     Readmission Risk Interventions No flowsheet data found.

## 2019-06-07 NOTE — Progress Notes (Signed)
Pt waiting to discharge for medications from SW. CN aware

## 2019-06-08 LAB — CULTURE, BLOOD (ROUTINE X 2)
Culture: NO GROWTH
Culture: NO GROWTH
Special Requests: ADEQUATE

## 2019-06-22 ENCOUNTER — Ambulatory Visit: Payer: Medicaid Other | Admitting: Certified Nurse Midwife

## 2019-06-27 ENCOUNTER — Telehealth: Payer: Self-pay | Admitting: Certified Nurse Midwife

## 2019-06-27 ENCOUNTER — Telehealth: Payer: Self-pay

## 2019-06-27 NOTE — Telephone Encounter (Signed)
The patient called and rescheduled her nexplanon appointment, pt stated she was unable to cancel the apt due to her phone being off. The pt also stated that she has a bad UTI and is wanting to know if something can be sent to her pharmacy or if she needs to wait until her apt tomorrow. Please advise.

## 2019-06-27 NOTE — Telephone Encounter (Signed)
mychart message sent

## 2019-06-28 ENCOUNTER — Encounter: Payer: Self-pay | Admitting: Certified Nurse Midwife

## 2019-06-28 ENCOUNTER — Ambulatory Visit (INDEPENDENT_AMBULATORY_CARE_PROVIDER_SITE_OTHER): Payer: Medicaid Other | Admitting: Certified Nurse Midwife

## 2019-06-28 ENCOUNTER — Other Ambulatory Visit: Payer: Self-pay

## 2019-06-28 VITALS — BP 116/62 | HR 105 | Ht 62.0 in | Wt 126.3 lb

## 2019-06-28 DIAGNOSIS — Z3042 Encounter for surveillance of injectable contraceptive: Secondary | ICD-10-CM | POA: Insufficient documentation

## 2019-06-28 DIAGNOSIS — Z87448 Personal history of other diseases of urinary system: Secondary | ICD-10-CM | POA: Diagnosis not present

## 2019-06-28 DIAGNOSIS — Z8619 Personal history of other infectious and parasitic diseases: Secondary | ICD-10-CM

## 2019-06-28 DIAGNOSIS — R8290 Unspecified abnormal findings in urine: Secondary | ICD-10-CM

## 2019-06-28 DIAGNOSIS — Z3046 Encounter for surveillance of implantable subdermal contraceptive: Secondary | ICD-10-CM

## 2019-06-28 DIAGNOSIS — Z3049 Encounter for surveillance of other contraceptives: Secondary | ICD-10-CM

## 2019-06-28 DIAGNOSIS — N390 Urinary tract infection, site not specified: Secondary | ICD-10-CM | POA: Insufficient documentation

## 2019-06-28 LAB — POCT URINALYSIS DIPSTICK
Bilirubin, UA: NEGATIVE
Blood, UA: NEGATIVE
Glucose, UA: NEGATIVE
Ketones, UA: NEGATIVE
Nitrite, UA: NEGATIVE
Protein, UA: POSITIVE — AB
Spec Grav, UA: >=1.03 — AB (ref 1.010–1.025)
Urobilinogen, UA: 0.2 E.U./dL
pH, UA: 6 (ref 5.0–8.0)

## 2019-06-28 MED ORDER — MEDROXYPROGESTERONE ACETATE 150 MG/ML IM SUSP: 150.0000 mg | INTRAMUSCULAR | 4 refills | Status: DC

## 2019-06-28 MED ORDER — MEDROXYPROGESTERONE ACETATE 150 MG/ML IM SUSP
150.0000 mg | Freq: Once | INTRAMUSCULAR | Status: AC
  Administered 2019-06-28: 17:00:00 150 mg via INTRAMUSCULAR

## 2019-06-28 NOTE — Progress Notes (Signed)
Patient here to have Nexplanon removed due to BTB, sharp pain in left arm and "really bad mood swings".  Patient also c/o strong odor when urinating, feeling very thirsty, burning with urination and feeling light headed x4 days.

## 2019-06-28 NOTE — Patient Instructions (Signed)
Urinary Tract Infection, Adult A urinary tract infection (UTI) is an infection of any part of the urinary tract. The urinary tract includes:  The kidneys.  The ureters.  The bladder.  The urethra. These organs make, store, and get rid of pee (urine) in the body. What are the causes? This is caused by germs (bacteria) in your genital area. These germs grow and cause swelling (inflammation) of your urinary tract. What increases the risk? You are more likely to develop this condition if:  You have a small, thin tube (catheter) to drain pee.  You cannot control when you pee or poop (incontinence).  You are female, and: ? You use these methods to prevent pregnancy: ? A medicine that kills sperm (spermicide). ? A device that blocks sperm (diaphragm). ? You have low levels of a female hormone (estrogen). ? You are pregnant.  You have genes that add to your risk.  You are sexually active.  You take antibiotic medicines.  You have trouble peeing because of: ? A prostate that is bigger than normal, if you are female. ? A blockage in the part of your body that drains pee from the bladder (urethra). ? A kidney stone. ? A nerve condition that affects your bladder (neurogenic bladder). ? Not getting enough to drink. ? Not peeing often enough.  You have other conditions, such as: ? Diabetes. ? A weak disease-fighting system (immune system). ? Sickle cell disease. ? Gout. ? Injury of the spine. What are the signs or symptoms? Symptoms of this condition include:  Needing to pee right away (urgently).  Peeing often.  Peeing small amounts often.  Pain or burning when peeing.  Blood in the pee.  Pee that smells bad or not like normal.  Trouble peeing.  Pee that is cloudy.  Fluid coming from the vagina, if you are female.  Pain in the belly or lower back. Other symptoms include:  Throwing up (vomiting).  No urge to eat.  Feeling mixed up (confused).  Being tired  and grouchy (irritable).  A fever.  Watery poop (diarrhea). How is this treated? This condition may be treated with:  Antibiotic medicine.  Other medicines.  Drinking enough water. Follow these instructions at home:  Medicines  Take over-the-counter and prescription medicines only as told by your doctor.  If you were prescribed an antibiotic medicine, take it as told by your doctor. Do not stop taking it even if you start to feel better. General instructions  Make sure you: ? Pee until your bladder is empty. ? Do not hold pee for a long time. ? Empty your bladder after sex. ? Wipe from front to back after pooping if you are a female. Use each tissue one time when you wipe.  Drink enough fluid to keep your pee pale yellow.  Keep all follow-up visits as told by your doctor. This is important. Contact a doctor if:  You do not get better after 1-2 days.  Your symptoms go away and then come back. Get help right away if:  You have very bad back pain.  You have very bad pain in your lower belly.  You have a fever.  You are sick to your stomach (nauseous).  You are throwing up. Summary  A urinary tract infection (UTI) is an infection of any part of the urinary tract.  This condition is caused by germs in your genital area.  There are many risk factors for a UTI. These include having a small, thin   tube to drain pee and not being able to control when you pee or poop.  Treatment includes antibiotic medicines for germs.  Drink enough fluid to keep your pee pale yellow. This information is not intended to replace advice given to you by your health care provider. Make sure you discuss any questions you have with your health care provider. Document Released: 04/05/2008 Document Revised: 10/05/2018 Document Reviewed: 04/27/2018 Elsevier Patient Education  2020 [employer]. Contraceptive Injection A contraceptive injection is a shot that prevents pregnancy. It is also  called the birth control shot. The shot contains the hormone progestin, which prevents pregnancy by:  Stopping the ovaries from releasing eggs.  Thickening cervical mucus to prevent sperm from entering the cervix.  Thinning the lining of the uterus to prevent a fertilized egg from attaching to the uterus. Contraceptive injections are given under the skin (subcutaneous) or into a muscle (intramuscular). For these shots to work, you must get one of them every 3 months (12 weeks) from a health care provider. Tell a health care provider about:  Any allergies you have.  All medicines you are taking, including vitamins, herbs, eye drops, creams, and over-the-counter medicines.  Any blood disorders you have.  Any medical conditions you have.  Whether you are pregnant or may be pregnant. What are the risks? Generally, this is a safe procedure. However, problems may occur, including:  Mood changes or depression.  Loss of bone density (osteoporosis) after long-term use.  Blood clots.  Higher risk of an egg being fertilized outside your uterus (ectopic pregnancy).This is rare. What happens before the procedure?  Your health care provider may do a routine physical exam.  You may have a test to make sure you are not pregnant. What happens during the procedure?  The area where the shot will be given will be cleaned and sanitized with alcohol.  A needle will be inserted into a muscle in your upper arm or buttock, or into the skin of your thigh or abdomen. The needle will be attached to a syringe with the medicine inside of it.  The medicine will be pushed through the syringe and injected into your body.  A small bandage (dressing) may be placed over the injection site. What can I expect after the procedure?  After the procedure, it is common to have: ? Soreness around the injection site for a couple of days. ? Irregular menstrual bleeding. ? Weight gain. ? Breast tenderness. ?  Headaches. ? Discomfort in your abdomen.  Ask your health care provider whether you need to use an added method of birth control (backup contraception), such as a condom, sponge, or spermicide. ? If the first shot is given 1-7 days after the start of your last period, you will not need backup contraception. ? If the first shot is given at any other time during your menstrual cycle, you should avoid having sex or you will need backup contraception for 7 days after you receive the shot. Follow these instructions at home: General instructions   Take over-the-counter and prescription medicines only as told by your health care provider.  Do not massage the injection site.  Track your menstrual periods so you will know if they become irregular.  Always use a condom to protect against STIs (sexually transmitted infections).  Make sure you schedule an appointment in time for your next shot, and mark it on your calendar. For the birth control to prevent pregnancy, you must get the injections every 3 months (12  weeks). Lifestyle  Do not use any products that contain nicotine or tobacco, such as cigarettes and e-cigarettes. If you need help quitting, ask your health care provider.  Eat foods that are high in calcium and vitamin D, such as milk, cheese, and salmon. Doing this may help with any loss in bone density that is caused by the contraceptive injection. Ask your health care provider for dietary recommendations. Contact a health care provider if:  You have nausea or vomiting.  You have abnormal vaginal discharge or bleeding.  You miss a period or you think you might be pregnant.  You experience mood changes or depression.  You feel dizzy or light-headed.  You have leg pain. Get help right away if:  You have chest pain.  You cough up blood.  You have shortness of breath.  You have a severe headache that does not go away.  You have numbness in any part of your body.  You have  slurred speech.  You have vision problems.  You have vaginal bleeding that is abnormally heavy or does not stop.  You have severe pain in your abdomen.  You have depression that does not get better with treatment. If you ever feel like you may hurt yourself or others, or have thoughts about taking your own life, get help right away. You can go to your nearest emergency department or call:  Your local emergency services (911 in the [place]  A suicide crisis helpline, such as the [facility] at 484-603-9056. This is open 24 hours a day. Summary  A contraceptive injection is a shot that prevents pregnancy. It is also called the birth control shot.  The shot is given under the skin (subcutaneous) or into a muscle (intramuscular).  After this procedure, it is common to have soreness around the injection site for a couple of days.  To prevent pregnancy, the shot must be given by a health care provider every 3 months (12 weeks).  After you have the shot, ask your health care provider whether you need to use an added method of birth control (backup contraception), such as a condom, sponge, or spermicide. This information is not intended to replace advice given to you by your health care provider. Make sure you discuss any questions you have with your health care provider. Document Released: 06/13/2017 Document Revised: 09/30/2017 Document Reviewed: 06/13/2017 Elsevier Patient Education  2020 [employer].

## 2019-06-28 NOTE — Progress Notes (Addendum)
GYN ENCOUNTER NOTE  Subjective:       Kathleen Glass is a 23 y.o. G75P1001 female is here for gynecologic evaluation of the following issues:  1. Nexplanon removal and initiation of depo-provera 2. Malodorous urine with recent hospitalization for pyelonephritis and sepsis  Reports frequent UTIs since childhood, has never seen Urologist. Completed 10 day course of antibiotics last week.   Denies difficulty breathing or respiratory distress, chest pain, abdominal pain, excessive vaginal bleeding, and leg pain or swelling.    Gynecologic History  Patient's last menstrual period was 06/09/2019 (approximate). Period Pattern: (!) Irregular Menstrual Flow: Heavy Menstrual Control: Tampon Dysmenorrhea: (!) Moderate Dysmenorrhea Symptoms: Cramping  Contraception: Nexplanon desires removal due to mood changes, left arm pain, and breakthrough bleeding  Last Pap: due.   Obstetric History  OB History  Gravida Para Term Preterm AB Living  1 1 1     1   SAB TAB Ectopic Multiple Live Births        0 1    # Outcome Date GA Lbr Len/2nd Weight Sex Delivery Anes PTL Lv  1 Term 10/07/17 [redacted]w[redacted]d 18:47 / 01:49 6 lb 15.5 oz (3.16 kg) F Vag-Spont EPI  LIV    Obstetric Comments  1st Menstrual Cycle: 12    No past medical history on file.  Past Surgical History:  Procedure Laterality Date  . BREAST SURGERY Left 06-12-13   benign fibroadenoma    Current Outpatient Medications on File Prior to Visit  Medication Sig Dispense Refill  . acetaminophen (TYLENOL) 500 MG tablet Take 500 mg by mouth every 6 (six) hours as needed for mild pain, fever or headache.    . etonogestrel (NEXPLANON) 68 MG IMPL implant 1 each by Subdermal route once.    Marland Kitchen ibuprofen (ADVIL) 400 MG tablet Take 1 tablet (400 mg total) by mouth every 8 (eight) hours as needed for mild pain. 10 tablet 0   No current facility-administered medications on file prior to visit.     No Known Allergies  Social History   Socioeconomic  History  . Marital status: Single    Spouse name: Not on file  . Number of children: Not on file  . Years of education: Not on file  . Highest education level: Not on file  Occupational History  . Not on file  Social Needs  . Financial resource strain: Not on file  . Food insecurity    Worry: Not on file    Inability: Not on file  . Transportation needs    Medical: Not on file    Non-medical: Not on file  Tobacco Use  . Smoking status: Former Smoker    Packs/day: 0.50    Types: Cigarettes  . Smokeless tobacco: Never Used  Substance and Sexual Activity  . Alcohol use: No  . Drug use: No  . Sexual activity: Yes    Birth control/protection: Implant, Condom  Lifestyle  . Physical activity    Days per week: Not on file    Minutes per session: Not on file  . Stress: Not on file  Relationships  . Social Herbalist on phone: Not on file    Gets together: Not on file    Attends religious service: Not on file    Active member of club or organization: Not on file    Attends meetings of clubs or organizations: Not on file    Relationship status: Not on file  . Intimate partner violence    Fear  of current or ex partner: Not on file    Emotionally abused: Not on file    Physically abused: Not on file    Forced sexual activity: Not on file  Other Topics Concern  . Not on file  Social History Narrative  . Not on file    Family History  Problem Relation Age of Onset  . Breast cancer Other   . Breast cancer Paternal Aunt        great aunt  . Diabetes Mother   . Diabetes Maternal Grandfather   . Ovarian cancer Neg Hx   . Colon cancer Neg Hx     The following portions of the patient's history were reviewed and updated as appropriate: allergies, current medications, past family history, past medical history, past social history, past surgical history and problem list.  Review of Systems  ROS negative except as noted above. Information obtained from patient.    Objective:   BP 116/62   Pulse (!) 105   Ht 5\' 2"  (1.575 m)   Wt 126 lb 4.8 oz (57.3 kg)   LMP 06/09/2019 (Approximate)   BMI 23.10 kg/m    CONSTITUTIONAL: Well-developed, well-nourished female in no acute distress.   PHYSICAL EXAM: Not indicated.   GAD 7 : Generalized Anxiety Score 06/28/2019 01/04/2019  Nervous, Anxious, on Edge 0 1  Control/stop worrying 0 3  Worry too much - different things 0 3  Trouble relaxing 0 3  Restless 0 3  Easily annoyed or irritable 3 3  Afraid - awful might happen 0 0  Total GAD 7 Score 3 16  Anxiety Difficulty Not difficult at all Somewhat difficult    Depression screen [facility] 2/9 06/28/2019 01/04/2019 11/04/2017  Decreased Interest 0 3 3  Down, Depressed, Hopeless 0 2 0  PHQ - 2 Score 0 5 3  Altered sleeping 0 2 0  Tired, decreased energy 0 3 0  Change in appetite 1 2 0  Feeling bad or failure about yourself  0 1 0  Trouble concentrating 0 0 0  Moving slowly or fidgety/restless 0 0 0  Suicidal thoughts 0 0 0  PHQ-9 Score 1 13 3   Difficult doing work/chores Not difficult at all Somewhat difficult -    Urinalysis     Component Value Date/Time   BILIRUBINUR Negative 06/28/2019 1608   PROTEINUR Positive (A) 06/28/2019 1608   UROBILINOGEN 0.2 06/28/2019 1608   NITRITE Negative 06/28/2019 1608   LEUKOCYTESUR Moderate (2+) (A) 06/28/2019 1608    Assessment:   1. Nexplanon removal   2. History of pyelonephritis  - Urine Culture - Ambulatory referral to Urology  3. Malodorous urine  - POCT urinalysis dipstick - Ambulatory referral to Urology  4. Depo-Provera contraceptive status   5. Frequent UTI  - Ambulatory referral to Urology  6. History of sepsis  - Ambulatory referral to Urology    Plan:   Nexplanon removal, see note below.   Rx Depo, see orders. First injection given today, see chart.   Urine for cultures, see orders. Discussed treatment vs suppression medications.   Referral to Urology, see orders.    Reviewed red flag symptoms and when to call.   RTC x 3 months for next injection or sooner if needed.    Diona Fanti, CNM [facility], [facility] 06/28/19 4:04 PM   Nexplanon Removal  Kathleen Glass is a 23 y.o. year old Caucasian female here for Nexplanon removal.  Patient given informed consent for removal of her  Nexplanon.  BP 116/62   Pulse (!) 105   Ht 5\' 2"  (1.575 m)   Wt 126 lb 4.8 oz (57.3 kg)   LMP 06/09/2019 (Approximate)   BMI 23.10 kg/m   Appropriate time out taken. Implanon site identified.  Area prepped in usual sterile fashon. One cc of 2% lidocaine was used to anesthetize the area at the distal end of the implant. A small stab incision was made right beside the implant on the distal portion.  The Implanon rod was grasped using hemostats and removed without difficulty.  There was less than 3 cc blood loss. There were no complications.  Steri-strips were applied over the small incision and a pressure bandage was applied.  The patient tolerated the procedure well.  She was instructed to keep the area clean and dry, remove pressure bandage in 24 hours, and keep insertion site covered with the steri-strip for 3-5 days.    Follow-up PRN problems.   Diona Fanti, CNM [facility], [facility] 06/28/19 4:04 PM

## 2019-06-29 NOTE — Telephone Encounter (Signed)
Just sent her a MyChart message. Results are still pending. Will contact via MyChart over the weekend if they return before Monday. JML

## 2019-06-29 NOTE — Telephone Encounter (Signed)
The patient called to check the status of her urine analysis. Pt is requesting a call back. Please advise.

## 2019-06-30 ENCOUNTER — Other Ambulatory Visit: Payer: Self-pay | Admitting: Certified Nurse Midwife

## 2019-06-30 ENCOUNTER — Encounter: Payer: Self-pay | Admitting: Certified Nurse Midwife

## 2019-06-30 DIAGNOSIS — B9620 Unspecified Escherichia coli [E. coli] as the cause of diseases classified elsewhere: Secondary | ICD-10-CM | POA: Insufficient documentation

## 2019-06-30 DIAGNOSIS — N390 Urinary tract infection, site not specified: Secondary | ICD-10-CM

## 2019-06-30 LAB — URINE CULTURE

## 2019-06-30 MED ORDER — NITROFURANTOIN MONOHYD MACRO 100 MG PO CAPS: 100.0000 mg | ORAL_CAPSULE | Freq: Two times a day (BID) | ORAL | 1 refills | Status: DC

## 2019-07-06 ENCOUNTER — Telehealth: Payer: Self-pay

## 2019-07-06 NOTE — Telephone Encounter (Signed)
ATB is causing side effects. Pt has hot flashes, nausea, and dizziness.  Is this normal?? pls advise.

## 2019-07-06 NOTE — Telephone Encounter (Signed)
Please ask patient, how many days of treatment she has left. Will need test of cure due to history of ureosepsis. Some common side effects of macrobid are nausea and dizziness. If symptoms are too unreasonable to continue medication, then can change to Bactrim half tablet (40mg /200mg ) three times a week. Increase water to 80-100 ounces a daily. Thanks, JML

## 2019-08-01 ENCOUNTER — Other Ambulatory Visit: Payer: Self-pay

## 2019-08-01 ENCOUNTER — Ambulatory Visit (INDEPENDENT_AMBULATORY_CARE_PROVIDER_SITE_OTHER): Payer: Medicaid Other | Admitting: Urology

## 2019-08-01 ENCOUNTER — Encounter: Payer: Self-pay | Admitting: Urology

## 2019-08-01 VITALS — BP 131/79 | HR 92 | Ht 62.0 in | Wt 122.0 lb

## 2019-08-01 DIAGNOSIS — K5909 Other constipation: Secondary | ICD-10-CM

## 2019-08-01 DIAGNOSIS — Z87448 Personal history of other diseases of urinary system: Secondary | ICD-10-CM

## 2019-08-01 LAB — URINALYSIS, COMPLETE
Bilirubin, UA: NEGATIVE
Glucose, UA: NEGATIVE
Ketones, UA: NEGATIVE
Leukocytes,UA: NEGATIVE
Nitrite, UA: NEGATIVE
Protein,UA: NEGATIVE
Specific Gravity, UA: 1.025 (ref 1.005–1.030)
Urobilinogen, Ur: 0.2 mg/dL (ref 0.2–1.0)
pH, UA: 6 (ref 5.0–7.5)

## 2019-08-01 LAB — MICROSCOPIC EXAMINATION

## 2019-08-01 NOTE — Progress Notes (Signed)
08/01/2019 10:24 AM   Kathleen Glass 06/12/1996 [id number]  Referring provider: Diona Fanti, CNM [place],  [place] [address]  Chief Complaint  Patient presents with  . Recurrent UTI    New Patient    HPI: 23 year old female who presents today for evaluation of episode of pyelonephritis.  She was admitted in 06/2019 with bacteremia secondary to cystitis/pyelonephritis.  Both blood and urine cultures grew E. coli.  She ultimately recovered on antibiotics.  Renal ultrasound during admission showed increased echogenicity of the right kidney otherwise no stones, masses, abscesses or any other anatomic issues.  She was also admitted in 06/2018 with similar presentation.  Urine and blood cultures were negative on this occasion.  Her urinalysis was however grossly positive but contaminated.  Pyelonephritis was diagnosed radiographically based on decreased enhancement of the right kidney with perinephric stranding.  Most recently, she was seen in follow-up by her nurse midwife for [place] removal.  At that time, she reported having a "really bad UTI" including malodorous urine that any other associated urinary symptoms including no dysuria, urgency frequency hematuria, etc.  UA on 06/28/2019 was concerning for infection and subsequent culture grew E. Coli, pansensitive.  She was started on Macrobid advised to take this twice daily for a week and then daily until her urology follow-up.  She reports that with each of these episodes, she has minimal to no symptoms until she is profoundly sick with fevers and flank pain.  She has no lower urinary tract symptoms.  She does have a remote history of childhood recurrent urinary tract infections.  She was never evaluated more extensively for this.  These seem to have resolved until recently.  She does have a history of chronic constipation.  She reports that she is always been constipated even as a child.  She uses  MiraLAX about 3 times per month when she has difficulty having a bowel movement.  She reports that cheese is very constipating to her in particular.  She is sexually active and engages in oral anal and vaginal intercourse.  She does use very good hygiene practices when engaging in these activities.  She does wipe front to back.  No douching.  PMH: Past Medical History:  Diagnosis Date  . Urinary tract infection     Surgical History: Past Surgical History:  Procedure Laterality Date  . BREAST SURGERY Left 06-12-13   benign fibroadenoma    Home Medications:  Allergies as of 08/01/2019   No Known Allergies     Medication List       Accurate as of August 01, 2019 10:24 AM. If you have any questions, ask your nurse or doctor.        acetaminophen 500 MG tablet Commonly known as: TYLENOL Take 500 mg by mouth every 6 (six) hours as needed for mild pain, fever or headache.   ibuprofen 400 MG tablet Commonly known as: ADVIL Take 1 tablet (400 mg total) by mouth every 8 (eight) hours as needed for mild pain.   medroxyPROGESTERone 150 MG/ML injection Commonly known as: DEPO-PROVERA Inject 1 mL (150 mg total) into the muscle every 3 (three) months.   nitrofurantoin (macrocrystal-monohydrate) 100 MG capsule Commonly known as: Macrobid Take 1 capsule (100 mg total) by mouth 2 (two) times daily. For a week. Followed by one (1) capsule daily until Urology appointment.       Allergies: No Known Allergies  Family History: Family History  Problem Relation Age of Onset  .  Breast cancer Other   . Breast cancer Paternal Aunt        great aunt  . Diabetes Mother   . Diabetes Maternal Grandfather   . Ovarian cancer Neg Hx   . Colon cancer Neg Hx     Social History:  reports that she has quit smoking. Her smoking use included cigarettes. She smoked 0.50 packs per day. She has never used smokeless tobacco. She reports that she does not drink alcohol or use drugs.  ROS: UROLOGY  Frequent Urination?: No Hard to postpone urination?: No Burning/pain with urination?: No Get up at night to urinate?: No Leakage of urine?: No Urine stream starts and stops?: No Trouble starting stream?: No Do you have to strain to urinate?: No Blood in urine?: No Urinary tract infection?: Yes Sexually transmitted disease?: No Injury to kidneys or bladder?: No Painful intercourse?: No Weak stream?: No Currently pregnant?: No Vaginal bleeding?: No Last menstrual period?: N  Gastrointestinal Nausea?: No Vomiting?: No Indigestion/heartburn?: No Diarrhea?: No Constipation?: No  Constitutional Fever: No Night sweats?: No Weight loss?: No Fatigue?: No  Skin Skin rash/lesions?: No Itching?: No  Eyes Blurred vision?: No Double vision?: No  Ears/Nose/Throat Sore throat?: No Sinus problems?: No  Hematologic/Lymphatic Swollen glands?: No Easy bruising?: No  Cardiovascular Leg swelling?: No Chest pain?: No  Respiratory Cough?: No Shortness of breath?: No  Endocrine Excessive thirst?: No  Musculoskeletal Back pain?: Yes Joint pain?: No  Neurological Headaches?: No Dizziness?: No  Psychologic Depression?: No Anxiety?: No  Physical Exam: BP 131/79   Pulse 92   Ht 5\' 2"  (1.575 m)   Wt 122 lb (55.3 kg)   BMI 22.31 kg/m   Constitutional:  Alert and oriented, No acute distress. HEENT: [place] AT, moist mucus membranes.  Trachea midline, no masses. Cardiovascular: No clubbing, cyanosis, or edema. Respiratory: Normal respiratory effort, no increased work of breathing. Skin: No rashes, bruises or suspicious lesions. Neurologic: Grossly intact, no focal deficits, moving all 4 extremities. Psychiatric: Normal mood and affect.  Laboratory Data: Lab Results  Component Value Date   WBC 10.6 (H) 06/07/2019   HGB 9.6 (L) 06/07/2019   HCT 28.6 (L) 06/07/2019   MCV 80.8 06/07/2019   PLT 236 06/07/2019    Lab Results  Component Value Date   CREATININE 0.37  (L) 06/07/2019    Lab Results  Component Value Date   HGBA1C 5.1 06/26/2018    Urinalysis UA today reviewed, negative  Pertinent Imaging: Results for orders placed during the hospital encounter of 06/03/19  DG Abdomen 1 View   Narrative CLINICAL DATA:  Left flank pain.  Urinary tract infection.  EXAM: ABDOMEN - 1 VIEW  COMPARISON:  CT abdomen pelvis 06/27/2018  FINDINGS: Gas is demonstrated within nondilated loops of large and small bowel in a nonobstructed pattern. Large amount of stool within the cecum and ascending colon. No definite radiopaque calcification within the expected location of the kidneys or ureters. Osseous structures unremarkable.  IMPRESSION: No acute process within the abdomen.  Large amount of stool within the cecum and ascending colon.   Electronically Signed   By: Lovey Newcomer M.D.   On: 06/03/2019 06:00     Results for orders placed during the hospital encounter of 06/03/19  US Renal   Narrative CLINICAL DATA:  Possible obstruction  EXAM: RENAL / URINARY TRACT ULTRASOUND COMPLETE  COMPARISON:  None.  FINDINGS: Right Kidney:  Renal measurements: 12.6 x 5.4 x 6.4 cm. = volume: 228 mL. Mild increased echogenicity is  noted. No hydronephrosis is seen.  Left Kidney:  Renal measurements: 11.3 x 7.0 x 6.1 cm. = volume: 252 mL. Echogenicity within normal limits. No mass or hydronephrosis visualized.  Bladder:  Appears normal for degree of bladder distention.  IMPRESSION: No obstructive changes are noted. Mild increased echogenicity is noted within the right kidney.   Electronically Signed   By: Inez Catalina M.D.   On: 06/03/2019 23:30    Renal ultrasound and KUB reviewed personally today.  Agree with radiologic interpretation.  I also reviewed a remote CT scan which is also unremarkable other than for right perinephric stranding.  Assessment & Plan:    1. History of pyelonephritis Personal history of pyelonephritis x 2 and  remote history of childhood infections.  Unfortunately given that her presentation often lacks preceding symptoms including traditional lower urinary tract symptoms, is difficult to predict when she is going to have a more significant infection.  At this point time, elected to manage her conservatively with the below interventions.  If she continues to have infections, may consider additional work-up including cystoscopy, possible VCUG amongst others.  Following recommendations were made to the patient today and discussed in detail:  1.  Okay to stop Macrobid daily for the time being  2.  Recommend daily probiotic (lactobacillus, comes in multiple different formats including tablets, Gummies, powders, or yogurt with live active culture)  3.  Cranberry tablets twice daily  4.  Good hygiene practices as discussed in the office  5.  Please call our office immediately with any signs or symptoms or concerns for urinary tract infection for same-day visit/treatment  6.  If there is another episode of severe pyelonephritis, would consider longer course of suppressive antibiotics in the future or further work up   - Urinalysis, Complete  2. Chronic constipation Likely contributing factor to infections  Extensive discussion today regarding bowel hygiene, consider adding stool softener to her regimen.   Return if symptoms worsen or fail to improve.  Hollice Espy, MD  [facility] [address], [place], [place] [address] [phone]

## 2019-08-01 NOTE — Patient Instructions (Signed)
The following recommendations were made today during your visit:  1.  Okay to stop Macrobid daily for the time being  2.  Recommend daily probiotic (lactobacillus, comes in multiple different formats including tablets, Gummies, powders, or yogurt with live active culture)  3.  Cranberry tablets twice daily  4.  Good hygiene practices as discussed in the office  5.  Please call our office immediately with any signs or symptoms or concerns for urinary tract infection for same-day visit/treatment  6.  If there is another episode of severe pyelonephritis, would consider longer course of suppressive antibiotics in the future or further work up

## 2019-09-09 IMAGING — US US RENAL
1 series · 14 of 25 positions shown · non-contrast
Comparison: None.

CLINICAL DATA: Possible obstruction

EXAM:
RENAL / URINARY TRACT ULTRASOUND COMPLETE

[Series 1: us renal · 14 of 33 slices shown]
[im 1/33]
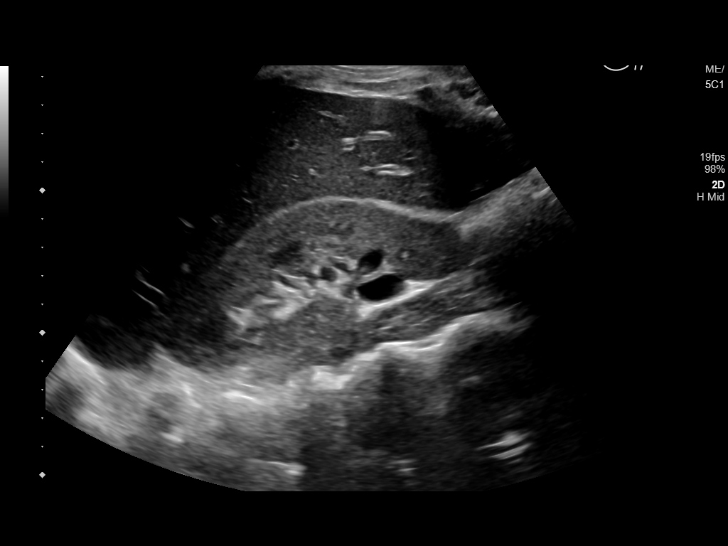
[im 3/33]
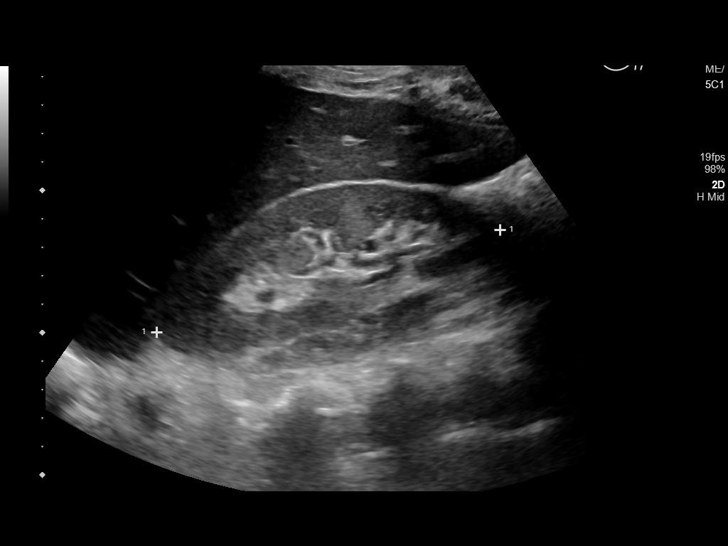
[im 6/33]
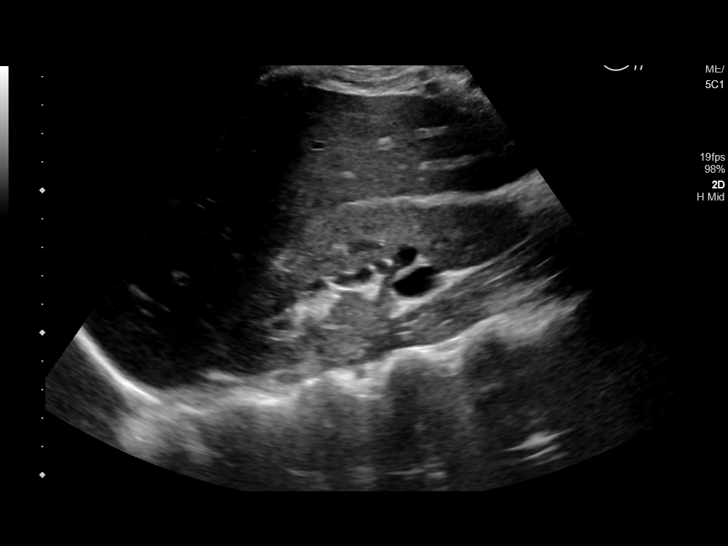
[im 9/33]
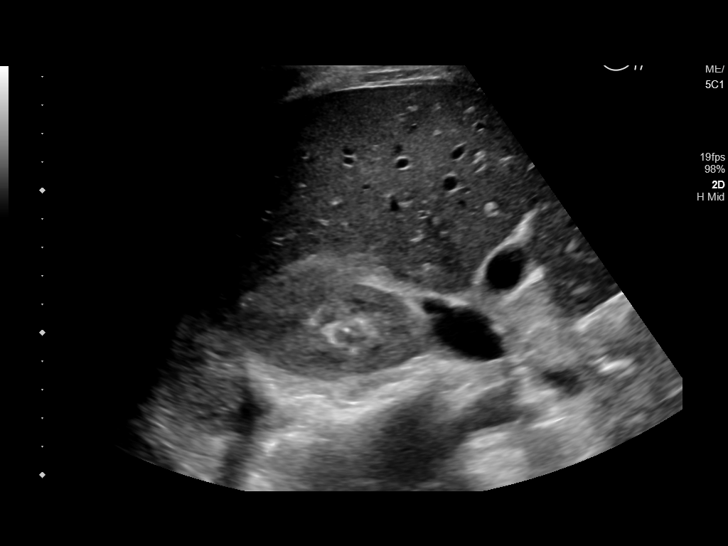
[im 11/33]
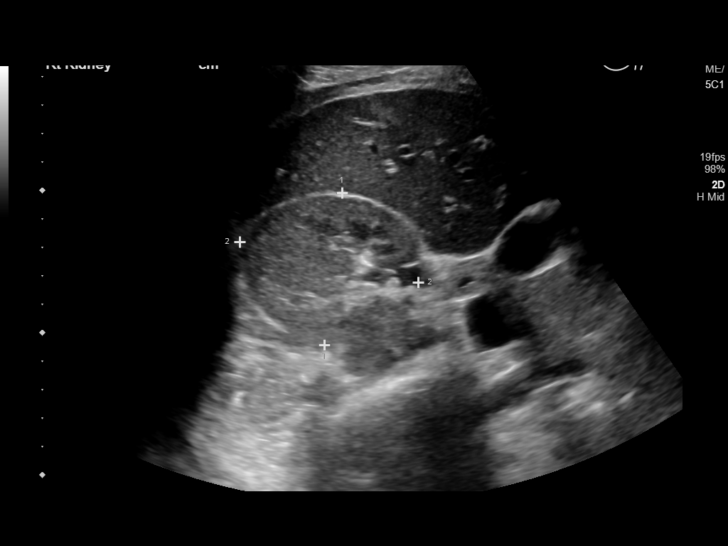
[im 13/33]
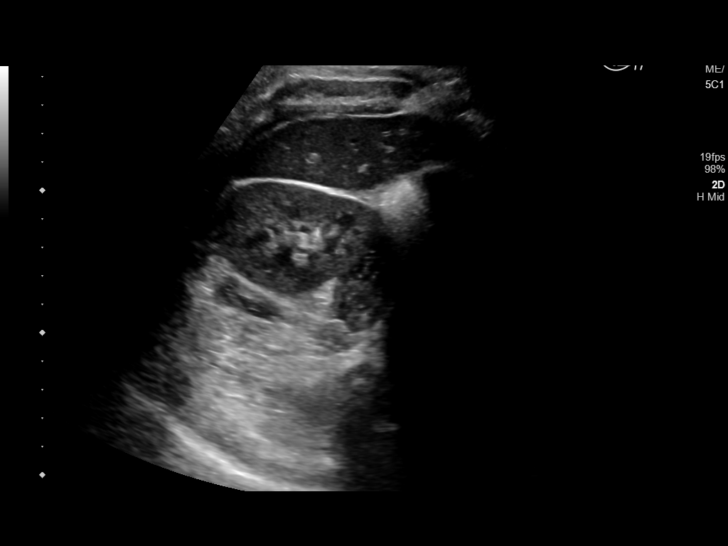
[im 15/33]
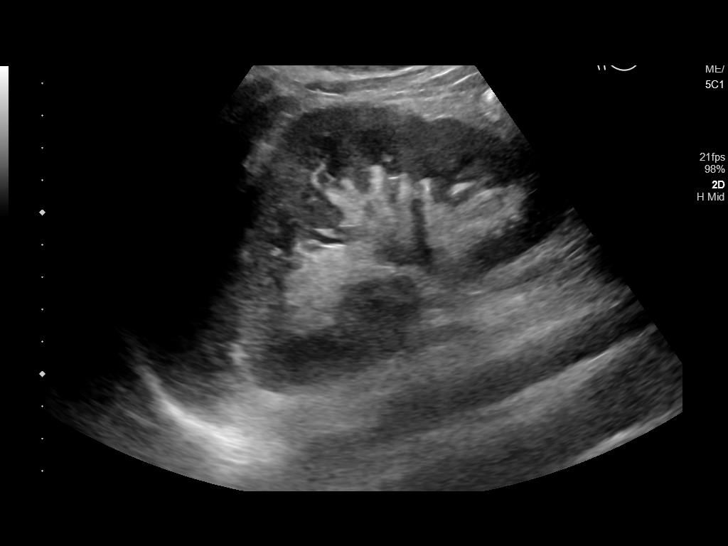
[im 18/33]
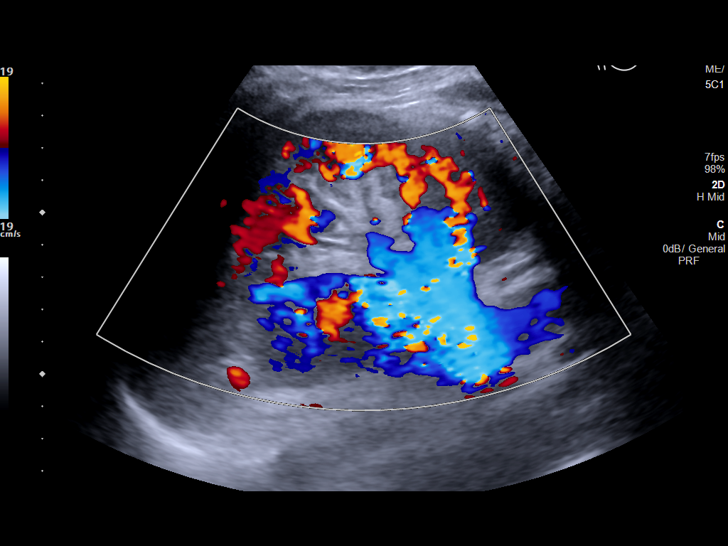
[im 21/33]
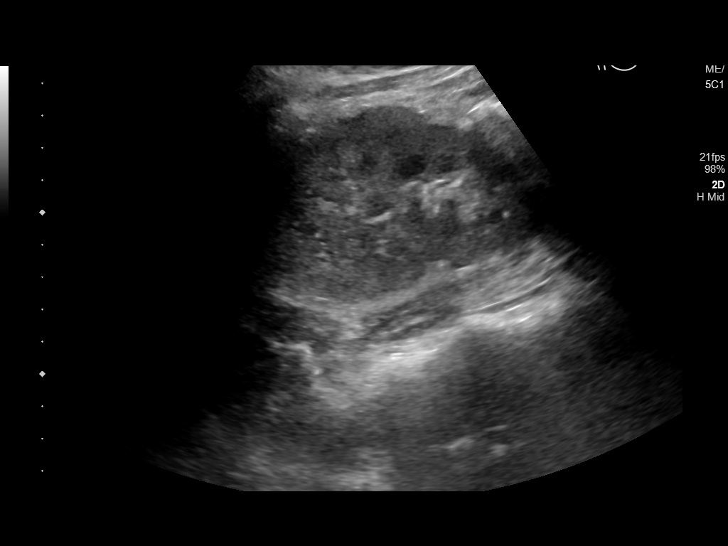
[im 22/33]
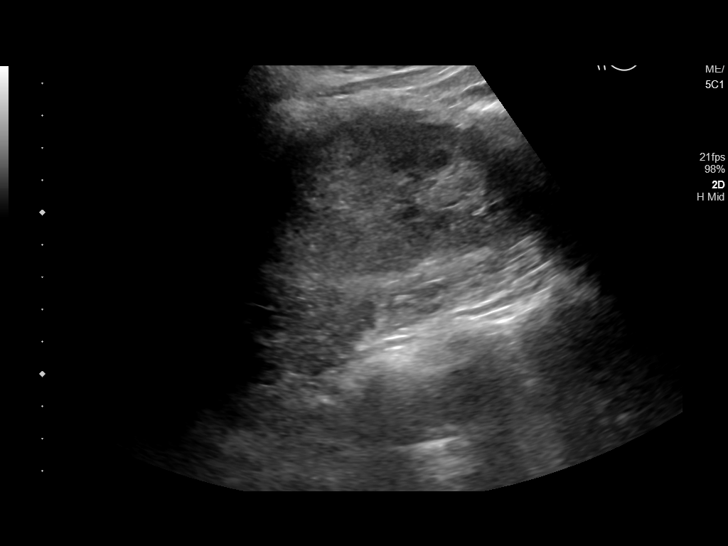
[im 25/33]
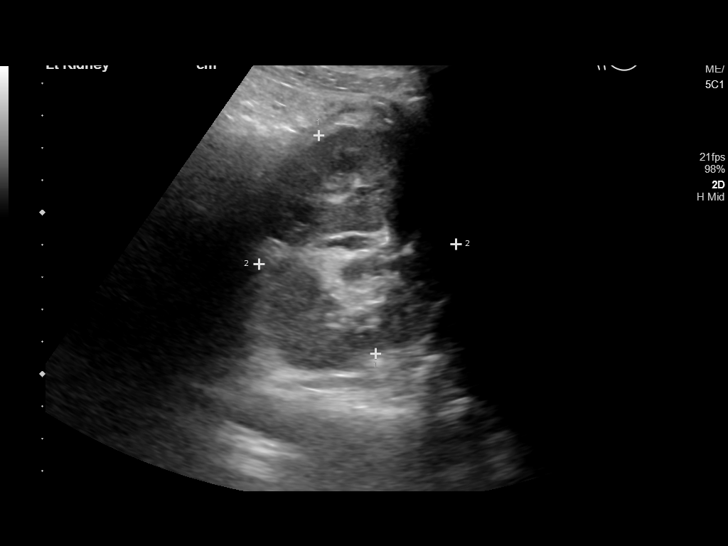
[im 27/33]
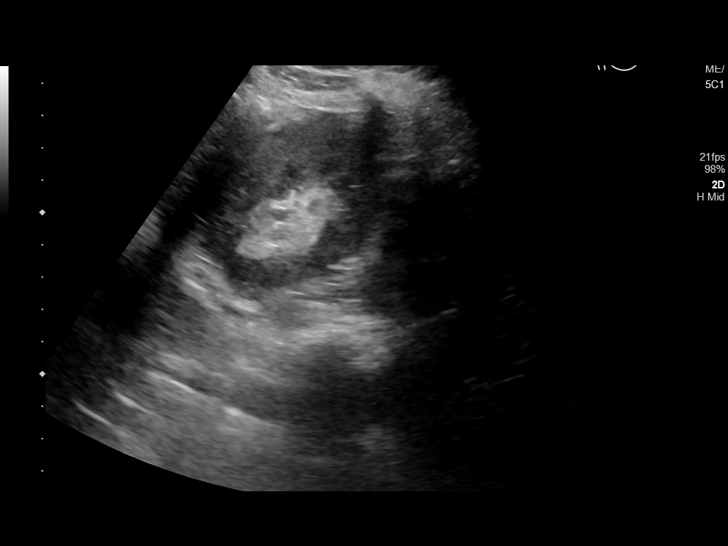
[im 30/33]
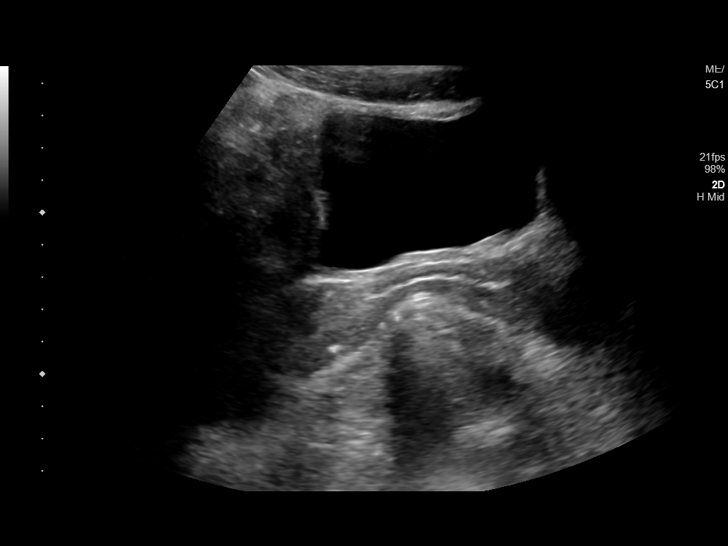
[im 33/33]
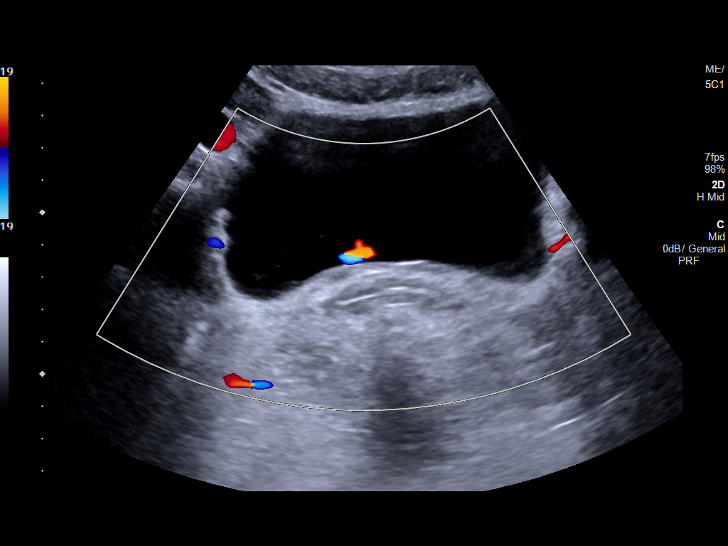

[14 of 25 positions shown; findings below may reference images not displayed]

FINDINGS: Right Kidney:

Renal measurements: 12.6 x 5.4 x 6.4 cm. = volume: 228 mL. Mild
increased echogenicity is noted. No hydronephrosis is seen.

Left Kidney:

Renal measurements: 11.3 x 7.0 x 6.1 cm. = volume: 252 mL.
Echogenicity within normal limits. No mass or hydronephrosis
visualized.

Bladder:

Appears normal for degree of bladder distention.
IMPRESSION: No obstructive changes are noted. Mild increased echogenicity is
noted within the right kidney.

## 2019-10-01 ENCOUNTER — Ambulatory Visit: Payer: Medicaid Other

## 2019-10-03 ENCOUNTER — Ambulatory Visit: Payer: Medicaid Other

## 2019-10-03 NOTE — Progress Notes (Deleted)
Date last pap: 2014 Last Depo-Provera: 06/28/2019 Side Effects if any: NA Serum HCG indicated? Needs UPT Depo-Provera 150 mg IM given by: [facility] CMA Next appointment due 12/19/2019-01/01/2020

## 2019-10-11 ENCOUNTER — Ambulatory Visit: Payer: Medicaid Other

## 2019-10-11 NOTE — Progress Notes (Deleted)
Last depo inj: 06/28/19 UPT: Side effects: N/A Next Depo- Provera injection due: 12/26/19-01/09/20 Annual exam due:

## 2019-10-12 ENCOUNTER — Ambulatory Visit: Payer: Medicaid Other

## 2019-10-18 ENCOUNTER — Ambulatory Visit: Payer: Medicaid Other | Attending: Internal Medicine

## 2019-10-18 ENCOUNTER — Other Ambulatory Visit: Payer: Self-pay

## 2019-10-18 DIAGNOSIS — Z20822 Contact with and (suspected) exposure to covid-19: Secondary | ICD-10-CM

## 2019-10-19 LAB — NOVEL CORONAVIRUS, NAA: SARS-CoV-2, NAA: NOT DETECTED

## 2019-12-13 ENCOUNTER — Telehealth: Payer: Self-pay

## 2019-12-13 ENCOUNTER — Telehealth: Payer: Self-pay | Admitting: Certified Nurse Midwife

## 2019-12-13 NOTE — Telephone Encounter (Signed)
Pt called in and stated that she is having uti symptoms. The pt wants to drop off. I called back and talked to ss she said that she can drop off tommorow or go to ED. Pt is going to drop off urine tomorrow.

## 2019-12-13 NOTE — Telephone Encounter (Signed)
Patient is having severe pain and UTI symptoms. Would like to be seen today. Please call patient

## 2019-12-14 ENCOUNTER — Ambulatory Visit (INDEPENDENT_AMBULATORY_CARE_PROVIDER_SITE_OTHER): Payer: Medicaid Other | Admitting: Certified Nurse Midwife

## 2019-12-14 ENCOUNTER — Other Ambulatory Visit: Payer: Self-pay

## 2019-12-14 VITALS — BP 112/72 | HR 78 | Wt 122.0 lb

## 2019-12-14 DIAGNOSIS — R102 Pelvic and perineal pain: Secondary | ICD-10-CM

## 2019-12-14 LAB — POCT URINALYSIS DIPSTICK
Bilirubin, UA: NEGATIVE
Blood, UA: NEGATIVE
Glucose, UA: NEGATIVE
Ketones, UA: NEGATIVE
Leukocytes, UA: NEGATIVE
Nitrite, UA: NEGATIVE
Protein, UA: NEGATIVE
Spec Grav, UA: 1.01 (ref 1.010–1.025)
Urobilinogen, UA: 0.2 E.U./dL
pH, UA: 7 (ref 5.0–8.0)

## 2019-12-14 NOTE — Progress Notes (Signed)
Patient comes in today for pelvic pain. She feels like she has a UTI. Urine dip did not show any signs of UTI. I will send for culture. Patient stated that she also has pain during intercourse. I let patient know that she would need to see Deneise Lever for this. I had her schedule appointment. Per Deneise Lever [place] was scheduled before her visit.

## 2019-12-16 LAB — URINE CULTURE

## 2019-12-18 ENCOUNTER — Other Ambulatory Visit: Payer: Self-pay

## 2019-12-18 ENCOUNTER — Ambulatory Visit (INDEPENDENT_AMBULATORY_CARE_PROVIDER_SITE_OTHER): Payer: Medicaid Other

## 2019-12-18 DIAGNOSIS — R102 Pelvic and perineal pain: Secondary | ICD-10-CM | POA: Diagnosis not present

## 2019-12-19 ENCOUNTER — Telehealth: Payer: Self-pay | Admitting: Certified Nurse Midwife

## 2019-12-19 NOTE — Telephone Encounter (Signed)
Patient called following up for the results of her ultrasound she had yesterday. Could you please advise patient.   -TC

## 2019-12-19 NOTE — Telephone Encounter (Signed)
Called and spoke with patient.  Advised patient that ultrasound report has not been signed off and will be reviewed at her 2/19 appointment.  Patient verbalized understanding.

## 2019-12-21 ENCOUNTER — Other Ambulatory Visit (HOSPITAL_COMMUNITY)
Admission: RE | Admit: 2019-12-21 | Discharge: 2019-12-21 | Disposition: A | Discharge status: Home / Self Care | Payer: Medicaid Other | Source: Ambulatory Visit | Attending: Certified Nurse Midwife | Admitting: Certified Nurse Midwife

## 2019-12-21 ENCOUNTER — Ambulatory Visit (INDEPENDENT_AMBULATORY_CARE_PROVIDER_SITE_OTHER): Payer: Medicaid Other | Admitting: Certified Nurse Midwife

## 2019-12-21 ENCOUNTER — Other Ambulatory Visit: Payer: Self-pay

## 2019-12-21 ENCOUNTER — Encounter: Payer: Self-pay | Admitting: Certified Nurse Midwife

## 2019-12-21 VITALS — BP 113/62 | HR 70 | Ht 62.0 in | Wt 126.9 lb

## 2019-12-21 DIAGNOSIS — N941 Unspecified dyspareunia: Secondary | ICD-10-CM

## 2019-12-21 DIAGNOSIS — N898 Other specified noninflammatory disorders of vagina: Secondary | ICD-10-CM | POA: Insufficient documentation

## 2019-12-21 DIAGNOSIS — N83202 Unspecified ovarian cyst, left side: Secondary | ICD-10-CM | POA: Diagnosis not present

## 2019-12-21 DIAGNOSIS — Z3202 Encounter for pregnancy test, result negative: Secondary | ICD-10-CM

## 2019-12-21 DIAGNOSIS — M549 Dorsalgia, unspecified: Secondary | ICD-10-CM | POA: Insufficient documentation

## 2019-12-21 DIAGNOSIS — R102 Pelvic and perineal pain: Secondary | ICD-10-CM | POA: Diagnosis not present

## 2019-12-21 DIAGNOSIS — Z3042 Encounter for surveillance of injectable contraceptive: Secondary | ICD-10-CM

## 2019-12-21 DIAGNOSIS — Z113 Encounter for screening for infections with a predominantly sexual mode of transmission: Secondary | ICD-10-CM

## 2019-12-21 DIAGNOSIS — M5489 Other dorsalgia: Secondary | ICD-10-CM | POA: Diagnosis not present

## 2019-12-21 LAB — POCT URINALYSIS DIPSTICK
Bilirubin, UA: NEGATIVE
Blood, UA: NEGATIVE
Glucose, UA: NEGATIVE
Ketones, UA: NEGATIVE
Nitrite, UA: NEGATIVE
Protein, UA: NEGATIVE
Spec Grav, UA: 1.02 (ref 1.010–1.025)
Urobilinogen, UA: 0.2 E.U./dL
pH, UA: 6 (ref 5.0–8.0)

## 2019-12-21 LAB — POCT URINE PREGNANCY: Preg Test, Ur: NEGATIVE

## 2019-12-21 MED ORDER — MEDROXYPROGESTERONE ACETATE 150 MG/ML IM SUSP
150.0000 mg | Freq: Once | INTRAMUSCULAR | Status: AC
  Administered 2019-12-21: 150 mg via INTRAMUSCULAR

## 2019-12-21 NOTE — Progress Notes (Signed)
Patient here to discuss 2/16 ultrasound results and get Depo injection.  Patient c/o still having intermittent pelvic pressure, constant lower back pain and thick creamy white vaginal discharge x4 days.

## 2019-12-21 NOTE — Progress Notes (Signed)
GYN ENCOUNTER NOTE  Subjective:       Kathleen Glass is a 24 y.o. G87P1001 female is here for gynecologic evaluation of the following issues:   1. Intermittent pelvic pressure and constant back pain for the last four (4) day; no relief with home treatment measures  2. Thick, creamy white vaginal discharge, no itching  3. Needs Depo injection  4. Wishes to discuss ultrasound findings   Denies difficulty breathing or respiratory distress, chest pain, abdominal pain, excessive vaginal bleeding, dysuria, and leg pain or swelling.    Gynecologic History  No LMP recorded (lmp unknown). Patient has had an injection.  Contraception: Depo-Provera injections  Last Pap: due  Obstetric History OB History  Gravida Para Term Preterm AB Living  1 1 1     1   SAB TAB Ectopic Multiple Live Births        0 1    # Outcome Date GA Lbr Len/2nd Weight Sex Delivery Anes PTL Lv  1 Term 10/07/17 [redacted]w[redacted]d 18:47 / 01:49 6 lb 15.5 oz (3.16 kg) F Vag-Spont EPI  LIV    Obstetric Comments  1st Menstrual Cycle: 12    Past Medical History:  Diagnosis Date  . Urinary tract infection     Past Surgical History:  Procedure Laterality Date  . BREAST SURGERY Left 06-12-13   benign fibroadenoma    Current Outpatient Medications on File Prior to Visit  Medication Sig Dispense Refill  . acetaminophen (TYLENOL) 500 MG tablet Take 500 mg by mouth every 6 (six) hours as needed for mild pain, fever or headache.    . ibuprofen (ADVIL) 400 MG tablet Take 1 tablet (400 mg total) by mouth every 8 (eight) hours as needed for mild pain. 10 tablet 0  . medroxyPROGESTERone (DEPO-PROVERA) 150 MG/ML injection Inject 1 mL (150 mg total) into the muscle every 3 (three) months. 1 mL 4   No current facility-administered medications on file prior to visit.    No Known Allergies  Social History   Socioeconomic History  . Marital status: Single    Spouse name: Not on file  . Number of children: Not on file  . Years of  education: Not on file  . Highest education level: Not on file  Occupational History  . Not on file  Tobacco Use  . Smoking status: Former Smoker    Packs/day: 0.50    Types: Cigarettes  . Smokeless tobacco: Never Used  Substance and Sexual Activity  . Alcohol use: No  . Drug use: No  . Sexual activity: Yes    Birth control/protection: Condom, Injection  Other Topics Concern  . Not on file  Social History Narrative  . Not on file   Social Determinants of Health   Financial Resource Strain:   . Difficulty of Paying Living Expenses: Not on file  Food Insecurity:   . Worried About Charity fundraiser in the Last Year: Not on file  . Ran Out of Food in the Last Year: Not on file  Transportation Needs:   . Lack of Transportation (Medical): Not on file  . Lack of Transportation (Non-Medical): Not on file  Physical Activity:   . Days of Exercise per Week: Not on file  . Minutes of Exercise per Session: Not on file  Stress:   . Feeling of Stress : Not on file  Social Connections:   . Frequency of Communication with Friends and Family: Not on file  . Frequency of Social Gatherings with Friends  and Family: Not on file  . Attends Religious Services: Not on file  . Active Member of Clubs or Organizations: Not on file  . Attends Archivist Meetings: Not on file  . Marital Status: Not on file  Intimate Partner Violence:   . Fear of Current or Ex-Partner: Not on file  . Emotionally Abused: Not on file  . Physically Abused: Not on file  . Sexually Abused: Not on file    Family History  Problem Relation Age of Onset  . Breast cancer Other   . Breast cancer Paternal Aunt        great aunt  . Diabetes Mother   . Diabetes Maternal Grandfather   . Ovarian cancer Neg Hx   . Colon cancer Neg Hx     The following portions of the patient's history were reviewed and updated as appropriate: allergies, current medications, past family history, past medical history, past  social history, past surgical history and problem list.  Review of Systems  ROS negative except as noted above. Information obtained from patient.   Objective:   BP 113/62   Pulse 70   Ht 5\' 2"  (1.575 m)   Wt 126 lb 14.4 oz (57.6 kg)   LMP  (LMP Unknown)   BMI 23.21 kg/m    CONSTITUTIONAL: Well-developed, well-nourished female in no acute distress.   PELVIC:  External Genitalia: Normal  Vagina: White discharge present, swab collected  Cervix: Normal   MUSCULOSKELETAL: Normal range of motion. No tenderness.  No cyanosis, clubbing, or edema.   ULTRASOUND REPORT  Location: [facility]  Date of Service: 12/18/2019   Indications:Pelvic Pain Findings:  The uterus is anteverted and measures 9.3 x 4.2 x 3.8 cm. Echo texture is homogenous without evidence of focal masses.  The Endometrium measures 5 mm.  Right Ovary measures 2.0 x 2.4 x 1.4 cm. It is normal in appearance. Left Ovary measures 3.4 x 3.0 x 2.0 cm. It is normal in appearance. Simple hypoechoic lesion with good through transmission measuring 2.2 x 1.7 x 2.4 cm Survey of the adnexa demonstrates no adnexal masses. There is no free fluid in the cul de sac.  There appears to be  echogenic particles seen within the bladder .  Impression: 1. Lt ovarian simple cyst. 2. Debris In bladder as described above.   Recommendations: 1.Clinical correlation with the patient's History and Physical Exam.   Assessment:   1. Encounter for management and injection of depo-Provera  - POCT urine pregnancy  2. Pelvic pressure in female  - POCT urinalysis dipstick - Cervicovaginal ancillary only  3. Vaginal discharge  - Cervicovaginal ancillary only  4. Back pain, unspecified back location, unspecified back pain laterality, unspecified chronicity  - Cervicovaginal ancillary only   5. Left ovarian cyst  6. Dyspareunia  Plan:   Ultrasound findings reviewed with patient, verbalized understanding.   Advised  use of OTC Lidocaine patches.   Encouraged follow up with Urology.   Referral to Pelvic Floor PT, see orders.   Vaginal swab collected, will contact patient with results.   Reviewed red flag symptoms and when to call.   RTC for ANNUAL EXAM and PAP, past due.    Diona Fanti, CNM [facility], [facility] 12/22/19 12:59 AM

## 2019-12-21 NOTE — Patient Instructions (Signed)
 Vaginitis Vaginitis is a condition in which the vaginal tissue swells and becomes red (inflamed). This condition is most often caused by a change in the normal balance of bacteria and yeast that live in the vagina. This change causes an overgrowth of certain bacteria or yeast, which causes the inflammation. There are different types of vaginitis, but the most common types are:  Bacterial vaginosis.  Yeast infection (candidiasis).  Trichomoniasis vaginitis. This is a sexually transmitted disease (STD).  Viral vaginitis.  Atrophic vaginitis.  Allergic vaginitis. What are the causes? The cause of this condition depends on the type of vaginitis. It can be caused by:  Bacteria (bacterial vaginosis).  Yeast, which is a fungus (yeast infection).  A parasite (trichomoniasis vaginitis).  A virus (viral vaginitis).  Low hormone levels (atrophic vaginitis). Low hormone levels can occur during pregnancy, breastfeeding, or after menopause.  Irritants, such as bubble baths, scented tampons, and feminine sprays (allergic vaginitis). Other factors can change the normal balance of the yeast and bacteria that live in the vagina. These include:  Antibiotic medicines.  Poor hygiene.  Diaphragms, vaginal sponges, spermicides, birth control pills, and intrauterine devices (IUD).  Sex.  Infection.  Uncontrolled diabetes.  A weakened defense (immune) system. What increases the risk? This condition is more likely to develop in women who:  Smoke.  Use vaginal douches, scented tampons, or scented sanitary pads.  Wear tight-fitting pants.  Wear thong underwear.  Use oral birth control pills or an IUD.  Have sex without a condom.  Have multiple sex partners.  Have an STD.  Frequently use the spermicide nonoxynol-9.  Eat lots of foods high in sugar.  Have uncontrolled diabetes.  Have low estrogen levels.  Have a weakened immune system from an immune disorder or medical  treatment.  Are pregnant or breastfeeding. What are the signs or symptoms? Symptoms vary depending on the cause of the vaginitis. Common symptoms include:  Abnormal vaginal discharge. ? The discharge is white, gray, or yellow with bacterial vaginosis. ? The discharge is thick, white, and cheesy with a yeast infection. ? The discharge is frothy and yellow or greenish with trichomoniasis.  A bad vaginal smell. The smell is fishy with bacterial vaginosis.  Vaginal itching, pain, or swelling.  Sex that is painful.  Pain or burning when urinating. Sometimes there are no symptoms. How is this diagnosed? This condition is diagnosed based on your symptoms and medical history. A physical exam, including a pelvic exam, will also be done. You may also have other tests, including:  Tests to determine the pH level (acidity or alkalinity) of your vagina.  A whiff test, to assess the odor that results when a sample of your vaginal discharge is mixed with a potassium hydroxide solution.  Tests of vaginal fluid. A sample will be examined under a microscope. How is this treated? Treatment varies depending on the type of vaginitis you have. Your treatment may include:  Antibiotic creams or pills to treat bacterial vaginosis and trichomoniasis.  Antifungal medicines, such as vaginal creams or suppositories, to treat a yeast infection.  Medicine to ease discomfort if you have viral vaginitis. Your sexual partner should also be treated.  Estrogen delivered in a cream, pill, suppository, or vaginal ring to treat atrophic vaginitis. If vaginal dryness occurs, lubricants and moisturizing creams may help. You may need to avoid scented soaps, sprays, or douches.  Stopping use of a product that is causing allergic vaginitis. Then using a vaginal cream to treat the symptoms.   these instructions at home: Lifestyle  Keep your genital area clean and dry. Avoid soap, and only rinse the area with  water.  Do not douche or use tampons until your health care provider says it is okay to do so. Use sanitary pads, if needed.  Do not have sex until your health care provider approves. When you can return to sex, practice safe sex and use condoms.  Wipe from front to back. This avoids the spread of bacteria from the rectum to the vagina. General instructions  Take over-the-counter and prescription medicines only as told by your health care provider.  If you were prescribed an antibiotic medicine, take or use it as told by your health care provider. Do not stop taking or using the antibiotic even if you start to feel better.  Keep all follow-up visits as told by your health care provider. This is important. How is this prevented?  Use mild, non-scented products. Do not use things that can irritate the vagina, such as fabric softeners. Avoid the following products if they are scented: ? Feminine sprays. ? Detergents. ? Tampons. ? Feminine hygiene products. ? Soaps or bubble baths.  Let air reach your genital area. ? Wear cotton underwear to reduce moisture buildup. ? Avoid wearing underwear while you sleep. ? Avoid wearing tight pants and underwear or nylons without a cotton panel. ? Avoid wearing thong underwear.  Take off any wet clothing, such as bathing suits, as soon as possible.  Practice safe sex and use condoms. Contact a health care provider if:  You have abdominal pain.  You have a fever.  You have symptoms that last for more than 2-3 days. Get help right away if:  You have a fever and your symptoms suddenly get worse. Summary  Vaginitis is a condition in which the vaginal tissue becomes inflamed.This condition is most often caused by a change in the normal balance of bacteria and yeast that live in the vagina.  Treatment varies depending on the type of vaginitis you have.  Do not douche, use tampons , or have sex until your health care provider approves. When  you can return to sex, practice safe sex and use condoms. This information is not intended to replace advice given to you by your health care provider. Make sure you discuss any questions you have with your health care provider. Document Revised: 09/30/2017 Document Reviewed: 11/23/2016 Elsevier Patient Education  [place].   Dyspareunia, Female Dyspareunia is pain that is associated with sexual activity. This can affect any part of the genitals or lower abdomen. There are many possible causes of this condition. In some cases, diagnosing the cause of dyspareunia can be difficult. This condition can be mild, moderate, or severe. Depending on the cause, dyspareunia may get better with treatment, but may return (recur) over time. What are the causes?  The cause of this condition is not always known. However, problems that affect the vulva, vagina, uterus, and other organs may cause dyspareunia. Common causes of this condition include:  Vaginal dryness.  Giving birth.  Infection.  Skin changes or conditions.  Side effects of medicines.  Endometriosis. This is when tissue that is like the lining of the uterus grows on the outside of the uterus.  Psychological conditions. These include depression, anxiety, or traumatic experiences.  Allergic reaction. What increases the risk? The following factors may make you more likely to develop this condition:  History of physical or sexual trauma.  Some medicines.  No  longer having a monthly period (menopause).  Having recently given birth.  Taking baths using soaps that have perfumes. These can cause irritation.  Douching. What are the signs or symptoms? The main symptom of this condition is pain in any part of your genitals or lower abdomen during or after sex. This may include:  Irritation, burning, or stinging sensations in your vulva.  Discomfort when your vulva or surrounding area is touched.  Aching and throbbing  pain that may be constant.  Pain that gets worse when something is inserted into your vagina. How is this diagnosed? This condition may be diagnosed based on:  Your symptoms, including where and when your pain occurs.  Your medical history.  A physical exam. A pelvic exam will most likely be done.  Tests that include ultrasound, blood tests, and tests that check the body for infection.  Imaging tests, such as X-ray, MRI, and CT scan. You may be referred to a health care provider who specializes in women's health (gynecologist). How is this treated? Treatment depends on the cause of your condition and your symptoms. In most cases, you may need to stop sexual activity until your symptoms go away or get better. Treatment may include:  Lubricants, ointments, and creams.  Physical therapy.  Massage therapy.  Hormonal therapy.  Medicines to: ? Prevent or fight infection. ? Relieve pain. ? Help numb the area. ? Treat depression (antidepressants).  Counseling, which may include sex therapy.  Surgery. Follow these instructions at home: Lifestyle  Wear cotton underwear.  Use water-based lubricants as needed during sex. Avoid oil-based lubricants.  Do not use any products that can cause irritation. This may include certain condoms, spermicides, lubricants, soaps, tampons, vaginal sprays, or douches.  Always practice safe sex. Use a condom to prevent sexually transmitted infections (STIs).  Talk freely with your partner about your condition. General instructions  Take or apply over-the-counter and prescription medicines only as told by your health care provider.  Urinate before you have sex.  Consider joining a support group.  Get the results of any tests you have done. Ask your health care provider, or the department that is doing the procedure, when your results will be ready.  Keep all follow-up visits as told by your health care provider. This is important. Contact  a health care provider if:  You have vaginal bleeding after having sex.  You develop a lump at the opening of your vagina even if the lump is painless.  You have: ? Abnormal discharge from your vagina. ? Vaginal dryness. ? Itchiness or irritation of your vulva or vagina. ? A new rash. ? Symptoms that get worse or do not improve with treatment. ? A fever. ? Pain when you urinate. ? Blood in your urine. Get help right away if:  You have severe pain in your abdomen during or shortly after sex.  You pass out after sex. Summary  Dyspareunia is pain that is associated with sexual activity. This can affect any part of the genitals or lower abdomen.  There are many causes of this condition. Treatment depends on the cause and your symptoms. In most cases, you may need to stop sexual activity until your symptoms improve.  Take or apply over-the-counter and prescription medicines only as told by your health care provider.  Contact a health care provider if your symptoms get worse or do not improve with treatment.  Keep all follow-up visits as told by your health care provider. This is important. This information  is not intended to replace advice given to you by your health care provider. Make sure you discuss any questions you have with your health care provider. Document Revised: 12/25/2018 Document Reviewed: 12/25/2018 Elsevier Patient Education  [place].  Acute Back Pain, Adult Acute back pain is sudden and usually short-lived. It is often caused by an injury to the muscles and tissues in the back. The injury may result from:  A muscle or ligament getting overstretched or torn (strained). Ligaments are tissues that connect bones to each other. Lifting something improperly can cause a back strain.  Wear and tear (degeneration) of the spinal disks. Spinal disks are circular tissue that provides cushioning between the bones of the spine (vertebrae).  Twisting motions, such  as while playing sports or doing yard work.  A hit to the back.  Arthritis. You may have a physical exam, lab tests, and imaging tests to find the cause of your pain. Acute back pain usually goes away with rest and home care. Follow these instructions at home: Managing pain, stiffness, and swelling  Take over-the-counter and prescription medicines only as told by your health care provider.  Your health care provider may recommend applying ice during the first 24-48 hours after your pain starts. To do this: ? Put ice in a plastic bag. ? Place a towel between your skin and the bag. ? Leave the ice on for 20 minutes, 2-3 times a day.  If directed, apply heat to the affected area as often as told by your health care provider. Use the heat source that your health care provider recommends, such as a moist heat pack or a heating pad. ? Place a towel between your skin and the heat source. ? Leave the heat on for 20-30 minutes. ? Remove the heat if your skin turns bright red. This is especially important if you are unable to feel pain, heat, or cold. You have a greater risk of getting burned. Activity   Do not stay in bed. Staying in bed for more than 1-2 days can delay your recovery.  Sit up and stand up straight. Avoid leaning forward when you sit, or hunching over when you stand. ? If you work at a desk, sit close to it so you do not need to lean over. Keep your chin tucked in. Keep your neck drawn back, and keep your elbows bent at a right angle. Your arms should look like the letter "L." ? Sit high and close to the steering wheel when you drive. Add lower back (lumbar) support to your car seat, if needed.  Take short walks on even surfaces as soon as you are able. Try to increase the length of time you walk each day.  Do not sit, drive, or stand in one place for more than 30 minutes at a time. Sitting or standing for long periods of time can put stress on your back.  Do not drive or use  heavy machinery while taking prescription pain medicine.  Use proper lifting techniques. When you bend and lift, use positions that put less stress on your back: ? [place] your knees. ? Keep the load close to your body. ? Avoid twisting.  Exercise regularly as told by your health care provider. Exercising helps your back heal faster and helps prevent back injuries by keeping muscles strong and flexible.  Work with a physical therapist to make a safe exercise program, as recommended by your health care provider. Do any exercises as told  by your physical therapist. Lifestyle  Maintain a healthy weight. Extra weight puts stress on your back and makes it difficult to have good posture.  Avoid activities or situations that make you feel anxious or stressed. Stress and anxiety increase muscle tension and can make back pain worse. Learn ways to manage anxiety and stress, such as through exercise. General instructions  Sleep on a firm mattress in a comfortable position. Try lying on your side with your knees slightly bent. If you lie on your back, put a pillow under your knees.  Follow your treatment plan as told by your health care provider. This may include: ? Cognitive or behavioral therapy. ? Acupuncture or massage therapy. ? Meditation or yoga. Contact a health care provider if:  You have pain that is not relieved with rest or medicine.  You have increasing pain going down into your legs or buttocks.  Your pain does not improve after 2 weeks.  You have pain at night.  You lose weight without trying.  You have a fever or chills. Get help right away if:  You develop new bowel or bladder control problems.  You have unusual weakness or numbness in your arms or legs.  You develop nausea or vomiting.  You develop abdominal pain.  You feel faint. Summary  Acute back pain is sudden and usually short-lived.  Use proper lifting techniques. When you bend and lift, use positions that  put less stress on your back.  Take over-the-counter and prescription medicines and apply heat or ice as directed by your health care provider. This information is not intended to replace advice given to you by your health care provider. Make sure you discuss any questions you have with your health care provider. Document Revised: 02/06/2019 Document Reviewed: 06/01/2017 Elsevier Patient Education  2020 [place] 57-55 Years Old, Female Preventive care refers to visits with your health care provider and lifestyle choices that can promote health and wellness. This includes:  A yearly physical exam. This may also be called an annual well check.  Regular dental visits and eye exams.  Immunizations.  Screening for certain conditions.  Healthy lifestyle choices, such as eating a healthy diet, getting regular exercise, not using drugs or products that contain nicotine and tobacco, and limiting alcohol use. What can I expect for my preventive care visit? Physical exam Your health care provider will check your:  Height and weight. This may be used to calculate body mass index (BMI), which tells if you are at a healthy weight.  Heart rate and blood pressure.  Skin for abnormal spots. Counseling Your health care provider may ask you questions about your:  Alcohol, tobacco, and drug use.  Emotional well-being.  Home and relationship well-being.  Sexual activity.  Eating habits.  Work and work Statistician.  Method of birth control.  Menstrual cycle.  Pregnancy history. What immunizations do I need?  Influenza (flu) vaccine  This is recommended every year. Tetanus, diphtheria, and pertussis (Tdap) vaccine  You may need a Td booster every 10 years. Varicella (chickenpox) vaccine  You may need this if you have not been vaccinated. Human papillomavirus (HPV) vaccine  If recommended by your health care provider, you may need three doses over 6  months. Measles, mumps, and rubella (MMR) vaccine  You may need at least one dose of MMR. You may also need a second dose. Meningococcal conjugate (MenACWY) vaccine  One dose is recommended if you are age 70-21 years and a Advertising account planner  college student living in a residence hall, or if you have one of several medical conditions. You may also need additional booster doses. Pneumococcal conjugate (PCV13) vaccine  You may need this if you have certain conditions and were not previously vaccinated. Pneumococcal polysaccharide (PPSV23) vaccine  You may need one or two doses if you smoke cigarettes or if you have certain conditions. Hepatitis A vaccine  You may need this if you have certain conditions or if you travel or work in places where you may be exposed to hepatitis A. Hepatitis B vaccine  You may need this if you have certain conditions or if you travel or work in places where you may be exposed to hepatitis B. Haemophilus influenzae type b (Hib) vaccine  You may need this if you have certain conditions. You may receive vaccines as individual doses or as more than one vaccine together in one shot (combination vaccines). Talk with your health care provider about the risks and benefits of combination vaccines. What tests do I need?  Blood tests  Lipid and cholesterol levels. These may be checked every 5 years starting at age 60.  Hepatitis C test.  Hepatitis B test. Screening  Diabetes screening. This is done by checking your blood sugar (glucose) after you have not eaten for a while (fasting).  Sexually transmitted disease (STD) testing.  BRCA-related cancer screening. This may be done if you have a family history of breast, ovarian, tubal, or peritoneal cancers.  Pelvic exam and Pap test. This may be done every 3 years starting at age 12. Starting at age 43, this may be done every 5 years if you have a Pap test in combination with an HPV test. Talk with your health care  provider about your test results, treatment options, and if necessary, the need for more tests. Follow these instructions at home: Eating and drinking   Eat a diet that includes fresh fruits and vegetables, whole grains, lean protein, and low-fat dairy.  Take vitamin and mineral supplements as recommended by your health care provider.  Do not drink alcohol if: ? Your health care provider tells you not to drink. ? You are pregnant, may be pregnant, or are planning to become pregnant.  If you drink alcohol: ? Limit how much you have to 0-1 drink a day. ? Be aware of how much alcohol is in your drink. In the [place] one drink equals one 12 oz bottle of beer (355 mL), one 5 oz glass of wine (148 mL), or one 1 oz glass of hard liquor (44 mL). Lifestyle  Take daily care of your teeth and gums.  Stay active. Exercise for at least 30 minutes on 5 or more days each week.  Do not use any products that contain nicotine or tobacco, such as cigarettes, e-cigarettes, and chewing tobacco. If you need help quitting, ask your health care provider.  If you are sexually active, practice safe sex. Use a condom or other form of birth control (contraception) in order to prevent pregnancy and STIs (sexually transmitted infections). If you plan to become pregnant, see your health care provider for a preconception visit. What's next?  Visit your health care provider once a year for a well check visit.  Ask your health care provider how often you should have your eyes and teeth checked.  Stay up to date on all vaccines. This information is not intended to replace advice given to you by your health care provider. Make sure you discuss any questions  you have with your health care provider. Document Revised: 06/29/2018 Document Reviewed: 06/29/2018 Elsevier Patient Education  2020 [employer].

## 2019-12-22 DIAGNOSIS — M549 Dorsalgia, unspecified: Secondary | ICD-10-CM | POA: Insufficient documentation

## 2019-12-22 DIAGNOSIS — N941 Unspecified dyspareunia: Secondary | ICD-10-CM | POA: Insufficient documentation

## 2019-12-22 DIAGNOSIS — R102 Pelvic and perineal pain unspecified side: Secondary | ICD-10-CM | POA: Insufficient documentation

## 2019-12-22 DIAGNOSIS — N83202 Unspecified ovarian cyst, left side: Secondary | ICD-10-CM | POA: Insufficient documentation

## 2019-12-26 LAB — CERVICOVAGINAL ANCILLARY ONLY
Bacterial Vaginitis (gardnerella): NEGATIVE
Candida Glabrata: NEGATIVE
Candida Vaginitis: NEGATIVE
Chlamydia: NEGATIVE
Comment: NEGATIVE
Comment: NEGATIVE
Comment: NEGATIVE
Comment: NEGATIVE
Comment: NEGATIVE
Comment: NORMAL
Neisseria Gonorrhea: NEGATIVE
Trichomonas: NEGATIVE

## 2020-01-01 ENCOUNTER — Other Ambulatory Visit: Payer: Self-pay

## 2020-01-01 ENCOUNTER — Ambulatory Visit: Payer: Medicaid Other | Admitting: Adult Health

## 2020-01-01 ENCOUNTER — Encounter: Payer: Self-pay | Admitting: Urology

## 2020-01-01 ENCOUNTER — Telehealth: Payer: Self-pay | Admitting: *Deleted

## 2020-01-01 ENCOUNTER — Ambulatory Visit (INDEPENDENT_AMBULATORY_CARE_PROVIDER_SITE_OTHER): Payer: Medicaid Other | Admitting: Urology

## 2020-01-01 VITALS — BP 129/67 | HR 78 | Ht 62.0 in | Wt 129.0 lb

## 2020-01-01 DIAGNOSIS — N3010 Interstitial cystitis (chronic) without hematuria: Secondary | ICD-10-CM | POA: Diagnosis not present

## 2020-01-01 DIAGNOSIS — N3281 Overactive bladder: Secondary | ICD-10-CM | POA: Diagnosis not present

## 2020-01-01 DIAGNOSIS — Z87448 Personal history of other diseases of urinary system: Secondary | ICD-10-CM | POA: Diagnosis not present

## 2020-01-01 LAB — MICROSCOPIC EXAMINATION: RBC, Urine: NONE SEEN /hpf (ref 0–2)

## 2020-01-01 LAB — URINALYSIS, COMPLETE
Bilirubin, UA: NEGATIVE
Glucose, UA: NEGATIVE
Ketones, UA: NEGATIVE
Leukocytes,UA: NEGATIVE
Nitrite, UA: NEGATIVE
Protein,UA: NEGATIVE
RBC, UA: NEGATIVE
Specific Gravity, UA: 1.02 (ref 1.005–1.030)
Urobilinogen, Ur: 1 mg/dL (ref 0.2–1.0)
pH, UA: 8 — ABNORMAL HIGH (ref 5.0–7.5)

## 2020-01-01 NOTE — Progress Notes (Signed)
01/01/2020 11:04 AM   Leticia Clas 02/04/1996 [id number]  Referring provider: No referring provider defined for this encounter.  Chief Complaint  Patient presents with  . Follow-up    HPI: Neia Savoie is a 24 yo F who returns today for the evaluation and management of overactive bladder.  She was advised to return by her GYN providers.   See previous note for details.  She had a pelvic US on 12/18/2019 incidental for echogenic particles seen within bladder. Also, on same day she had back pain, pelvic pressure, and vaginal discharge.   Her urine dip was fairly unremarkable except for +1 leukocyte, w/o urine culture. She had a completely negative urine culture the previous week of getting a pelvic US.   She denies urinary leakage but has been having the frequent desire to urinate. She worked on constipation and has not had problems with it recently.   She has not done pelvic floor exercises before.   PMH: Past Medical History:  Diagnosis Date  . Urinary tract infection     Surgical History: Past Surgical History:  Procedure Laterality Date  . BREAST SURGERY Left 06-12-13   benign fibroadenoma    Home Medications:  Allergies as of 01/01/2020   No Known Allergies     Medication List       Accurate as of January 01, 2020 11:59 PM. If you have any questions, ask your nurse or doctor.        STOP taking these medications   acetaminophen 500 MG tablet Commonly known as: TYLENOL Stopped by: Hollice Espy, MD   ibuprofen 400 MG tablet Commonly known as: ADVIL Stopped by: Hollice Espy, MD     TAKE these medications   medroxyPROGESTERone 150 MG/ML injection Commonly known as: DEPO-PROVERA Inject 1 mL (150 mg total) into the muscle every 3 (three) months.       Allergies: No Known Allergies  Family History: Family History  Problem Relation Age of Onset  . Breast cancer Other   . Breast cancer Paternal Aunt        great aunt  . Diabetes Mother   .  Diabetes Maternal Grandfather   . Ovarian cancer Neg Hx   . Colon cancer Neg Hx     Social History:  reports that she has quit smoking. Her smoking use included cigarettes. She smoked 0.50 packs per day. She has never used smokeless tobacco. She reports that she does not drink alcohol or use drugs.   Physical Exam: BP 129/67   Pulse 78   Ht 5\' 2"  (1.575 m)   Wt 129 lb (58.5 kg)   LMP  (LMP Unknown)   BMI 23.59 kg/m   Constitutional:  Alert and oriented, No acute distress. HEENT: [place] AT, moist mucus membranes.  Trachea midline, no masses. Cardiovascular: No clubbing, cyanosis, or edema. Respiratory: Normal respiratory effort, no increased work of breathing. Skin: No rashes, bruises or suspicious lesions. Neurologic: Grossly intact, no focal deficits, moving all 4 extremities. Psychiatric: Normal mood and affect.  Laboratory Data:  Urinalysis  Pertinent Imaging: Patient Name: Genet Fischl DOB: 1996-07-01 MRN: [id number] ULTRASOUND REPORT  Location: [facility]  Date of Service: 12/18/2019     Indications:Pelvic Pain Findings:  The uterus is anteverted and measures 9.3 x 4.2 x 3.8 cm. Echo texture is homogenous without evidence of focal masses.  The Endometrium measures 5 mm.  Right Ovary measures 2.0 x 2.4 x 1.4 cm. It is normal in appearance. Left Ovary measures 3.4 x  3.0 x 2.0 cm. It is normal in appearance. Simple hypoechoic lesion with good through transmission measuring 2.2 x 1.7 x 2.4 cm Survey of the adnexa demonstrates no adnexal masses. There is no free fluid in the cul de sac.  There appears to be  echogenic particles seen within the bladder .  Impression: 1. Lt ovarian simple cyst. 2. Debris In bladder as described above.   Recommendations: 1.Clinical correlation with the patient's History and Physical Exam.   Jenine M. Albertine Grates    RDMS  Consider referral back to urology as discussed. The ultrasound images and findings were  reviewed by me and I agree with the above report.  Finis Bud, M.D. 12/25/2019 12:50 PM   I have personally reviewed the images and agree with radiologist interpretation.  Assessment & Plan:    1. OAB Pt reports of frequent urgency to urinate  Recommended Kegel exercises to pt, pt agreeable to plan to meet with PT  Trial of Myrbetriq 25 mg daily, # 28 samples given for 4 weeks; pt will call back to let us know how Myrbetriq is working Discussed possibility of IC as below  2. Interstitial cystitis We discussed the diease pathophysiology is poorly understood therefore treatment has been focused primarily on symptomatic relief as well as dietary and behavioral modification. Information pamphlets were reviewed and given today discussing the current understanding of the syndrome as well as treatment options. IC dietary information also given today as many patients experience relief with simple lifestyle modifications. We also discussed intermittent use of over the counter pyridium (no greater than 3 days at at time) for intermittent relief.  If conservative management fails, will consider further work up with cystoscopy to access for Hunter's ulcers or other more aggressive treatments, however, response to each of these interventions is highly variable.   Return for Will follow up MyChart.  Hollice Espy, MD  [facility] [address] [place], [place] [address] [phone]  I, Lucas Mallow, am acting as a scribe for Dr. Hollice Espy,  I have reviewed the above documentation for accuracy and completeness, and I agree with the above.   Hollice Espy, MD

## 2020-01-01 NOTE — Telephone Encounter (Signed)
Left patient VM to return my call.

## 2020-01-04 ENCOUNTER — Encounter: Payer: Medicaid Other | Admitting: Certified Nurse Midwife

## 2020-01-28 ENCOUNTER — Ambulatory Visit: Payer: Medicaid Other | Attending: Certified Nurse Midwife | Admitting: Physical Therapy

## 2020-02-07 ENCOUNTER — Encounter: Payer: Medicaid Other | Admitting: Physical Therapy

## 2020-02-21 ENCOUNTER — Encounter: Payer: Medicaid Other | Admitting: Physical Therapy

## 2020-03-06 ENCOUNTER — Encounter: Payer: Medicaid Other | Admitting: Physical Therapy

## 2020-03-13 ENCOUNTER — Encounter: Payer: Medicaid Other | Admitting: Physical Therapy

## 2020-03-18 ENCOUNTER — Encounter: Payer: Medicaid Other | Admitting: Physical Therapy

## 2020-03-25 ENCOUNTER — Encounter: Payer: Medicaid Other | Admitting: Physical Therapy

## 2020-04-22 ENCOUNTER — Ambulatory Visit: Payer: Medicaid Other | Attending: Internal Medicine

## 2020-04-22 DIAGNOSIS — Z23 Encounter for immunization: Secondary | ICD-10-CM

## 2020-04-22 NOTE — Progress Notes (Signed)
   Covid-19 Vaccination Clinic  Name:  Kathleen Glass    MRN: [id number] DOB: 22-Aug-1996  04/22/2020  [name] was observed post Covid-19 immunization for 15 minutes without incident. She was provided with Vaccine Information Sheet and instruction to access the V-Safe system.   [name] was instructed to call 911 with any severe reactions post vaccine: Marland Kitchen Difficulty breathing  . Swelling of face and throat  . A fast heartbeat  . A bad rash all over body  . Dizziness and weakness   Immunizations Administered    Name Date Dose VIS Date Route   Pfizer COVID-19 Vaccine 04/22/2020  3:05 PM 0.3 mL 12/26/2018 Intramuscular   Manufacturer: [place]   Lot: [id number]   [place]: [address]-9

## 2020-05-13 ENCOUNTER — Ambulatory Visit: Payer: Medicaid Other | Attending: Internal Medicine

## 2020-05-19 ENCOUNTER — Encounter: Payer: Medicaid Other | Admitting: Certified Nurse Midwife

## 2020-05-26 ENCOUNTER — Encounter: Payer: Self-pay | Admitting: Certified Nurse Midwife

## 2020-05-26 ENCOUNTER — Ambulatory Visit (INDEPENDENT_AMBULATORY_CARE_PROVIDER_SITE_OTHER): Payer: Medicaid Other | Admitting: Certified Nurse Midwife

## 2020-05-26 ENCOUNTER — Other Ambulatory Visit: Payer: Self-pay

## 2020-05-26 VITALS — BP 129/85 | HR 60 | Ht 62.0 in | Wt 125.2 lb

## 2020-05-26 DIAGNOSIS — R399 Unspecified symptoms and signs involving the genitourinary system: Secondary | ICD-10-CM | POA: Diagnosis not present

## 2020-05-26 DIAGNOSIS — Z87440 Personal history of urinary (tract) infections: Secondary | ICD-10-CM | POA: Diagnosis not present

## 2020-05-26 DIAGNOSIS — F419 Anxiety disorder, unspecified: Secondary | ICD-10-CM

## 2020-05-26 DIAGNOSIS — Z3009 Encounter for other general counseling and advice on contraception: Secondary | ICD-10-CM

## 2020-05-26 DIAGNOSIS — F329 Major depressive disorder, single episode, unspecified: Secondary | ICD-10-CM

## 2020-05-26 MED ORDER — BUSPIRONE HCL 5 MG PO TABS: 5.0000 mg | ORAL_TABLET | Freq: Three times a day (TID) | ORAL | 1 refills | Status: DC

## 2020-05-26 NOTE — Progress Notes (Signed)
Pt present for birth control. Pt stated that she would like to stop taking the depo and change to oral pills. Pt stated that she also would like to speak to provider concerning her increase in anxiety. GAD-7=14.

## 2020-05-26 NOTE — Progress Notes (Signed)
GYN ENCOUNTER NOTE  Subjective:       Kathleen Glass is a 24 y.o. G68P1001 female here to switch to OCPs and discuss medication for anxiety.   No longer wishes to use Depo provera, would like to try low dose pill.   Reports increased anxiety over the last few months. Considering medication.   Denies difficulty breathing or respiratory distress, chest pain, abdominal pain, excessive vaginal bleeding, dysuria, and leg pain or swelling.    Gynecologic History  Patient's last menstrual period was 05/24/2020. Period Duration (Days): 7 Period Pattern: Regular Menstrual Flow: Heavy Menstrual Control: Tampon Menstrual Control Change Freq (Hours): 2-3 Dysmenorrhea: (!) Severe Dysmenorrhea Symptoms: Cramping, Headache, Nausea  Contraception: Depo-Provera injections   Last Pap: due  Obstetric History  OB History  Gravida Para Term Preterm AB Living  1 1 1     1   SAB TAB Ectopic Multiple Live Births        0 1    # Outcome Date GA Lbr Len/2nd Weight Sex Delivery Anes PTL Lv  1 Term 10/07/17 [redacted]w[redacted]d 18:47 / 01:49 6 lb 15.5 oz (3.16 kg) F Vag-Spont EPI  LIV    Obstetric Comments  1st Menstrual Cycle: 12    Past Medical History:  Diagnosis Date  . Urinary tract infection     Past Surgical History:  Procedure Laterality Date  . BREAST SURGERY Left 06-12-13   benign fibroadenoma    Current Outpatient Medications on File Prior to Visit  Medication Sig Dispense Refill  . medroxyPROGESTERone (DEPO-PROVERA) 150 MG/ML injection Inject 1 mL (150 mg total) into the muscle every 3 (three) months. 1 mL 4   No current facility-administered medications on file prior to visit.    No Known Allergies  Social History   Socioeconomic History  . Marital status: Single    Spouse name: Not on file  . Number of children: Not on file  . Years of education: Not on file  . Highest education level: Not on file  Occupational History  . Not on file  Tobacco Use  . Smoking status: Former  Smoker    Packs/day: 0.50    Types: Cigarettes  . Smokeless tobacco: Never Used  Vaping Use  . Vaping Use: Never used  Substance and Sexual Activity  . Alcohol use: No  . Drug use: No  . Sexual activity: Yes    Birth control/protection: Condom, Injection  Other Topics Concern  . Not on file  Social History Narrative  . Not on file   Social Determinants of Health   Financial Resource Strain:   . Difficulty of Paying Living Expenses:   Food Insecurity:   . Worried About Charity fundraiser in the Last Year:   . Arboriculturist in the Last Year:   Transportation Needs:   . Film/video editor (Medical):   Marland Kitchen Lack of Transportation (Non-Medical):   Physical Activity:   . Days of Exercise per Week:   . Minutes of Exercise per Session:   Stress:   . Feeling of Stress :   Social Connections:   . Frequency of Communication with Friends and Family:   . Frequency of Social Gatherings with Friends and Family:   . Attends Religious Services:   . Active Member of Clubs or Organizations:   . Attends Archivist Meetings:   Marland Kitchen Marital Status:   Intimate Partner Violence:   . Fear of Current or Ex-Partner:   . Emotionally Abused:   .  Physically Abused:   . Sexually Abused:     Family History  Problem Relation Age of Onset  . Breast cancer Other   . Breast cancer Paternal Aunt        great aunt  . Diabetes Mother   . Diabetes Maternal Grandfather   . Bipolar disorder Father   . Diabetes Maternal Grandmother   . Ovarian cancer Neg Hx   . Colon cancer Neg Hx     The following portions of the patient's history were reviewed and updated as appropriate: allergies, current medications, past family history, past medical history, past social history, past surgical history and problem list.  Review of Systems  ROS negative except as noted above. Information obtained from patient.   Objective:   BP (!) 129/85   Pulse 60   Ht 5\' 2"  (1.575 m)   Wt 125 lb 3.2 oz (56.8  kg)   LMP 05/24/2020   BMI 22.90 kg/m    CONSTITUTIONAL: Well-developed, well-nourished female in no acute distress.   PHYSICAL EXAM: Not indicated.   GAD 7 : Generalized Anxiety Score 05/26/2020 06/28/2019 01/04/2019  Nervous, Anxious, on Edge 3 0 1  Control/stop worrying 2 0 3  Worry too much - different things 2 0 3  Trouble relaxing 2 0 3  Restless 2 0 3  Easily annoyed or irritable 3 3 3   Afraid - awful might happen 0 0 0  Total GAD 7 Score 14 3 16   Anxiety Difficulty Somewhat difficult Not difficult at all Somewhat difficult    Assessment:   1. Birth control counseling   2. UTI symptoms  - Urine Culture  3. Anxiety and depression   4. History of UTI  - Urine Culture   Plan:   Due to history of UTIs, patient request urine cultures; see orders.   Rx BuSpar, see orders.   Samples of Lo loestrin given.   RTC x 6-8 weeks for follow up or sooner if needed.    Dani Gobble, CNM [facility], [facility] 05/26/20 5:36 PM

## 2020-05-26 NOTE — Patient Instructions (Signed)
Generalized Anxiety Disorder, Adult Generalized anxiety disorder (GAD) is a mental health disorder. People with this condition constantly worry about everyday events. Unlike normal anxiety, worry related to GAD is not triggered by a specific event. These worries also do not fade or get better with time. GAD interferes with life functions, including relationships, work, and school. GAD can vary from mild to severe. People with severe GAD can have intense waves of anxiety with physical symptoms (panic attacks). What are the causes? The exact cause of GAD is not known. What increases the risk? This condition is more likely to develop in:  Women.  People who have a family history of anxiety disorders.  People who are very shy.  People who experience very stressful life events, such as the death of a loved one.  People who have a very stressful family environment. What are the signs or symptoms? People with GAD often worry excessively about many things in their lives, such as their health and family. They may also be overly concerned about:  Doing well at work.  Being on time.  Natural disasters.  Friendships. Physical symptoms of GAD include:  Fatigue.  Muscle tension or having muscle twitches.  Trembling or feeling shaky.  Being easily startled.  Feeling like your heart is pounding or racing.  Feeling out of breath or like you cannot take a deep breath.  Having trouble falling asleep or staying asleep.  Sweating.  Nausea, diarrhea, or irritable bowel syndrome (IBS).  Headaches.  Trouble concentrating or remembering facts.  Restlessness.  Irritability. How is this diagnosed? Your health care provider can diagnose GAD based on your symptoms and medical history. You will also have a physical exam. The health care provider will ask specific questions about your symptoms, including how severe they are, when they started, and if they come and go. Your health care  provider may ask you about your use of alcohol or drugs, including prescription medicines. Your health care provider may refer you to a mental health specialist for further evaluation. Your health care provider will do a thorough examination and may perform additional tests to rule out other possible causes of your symptoms. To be diagnosed with GAD, a person must have anxiety that:  Is out of his or her control.  Affects several different aspects of his or her life, such as work and relationships.  Causes distress that makes him or her unable to take part in normal activities.  Includes at least three physical symptoms of GAD, such as restlessness, fatigue, trouble concentrating, irritability, muscle tension, or sleep problems. Before your health care provider can confirm a diagnosis of GAD, these symptoms must be present more days than they are not, and they must last for six months or longer. How is this treated? The following therapies are usually used to treat GAD:  Medicine. Antidepressant medicine is usually prescribed for long-term daily control. Antianxiety medicines may be added in severe cases, especially when panic attacks occur.  Talk therapy (psychotherapy). Certain types of talk therapy can be helpful in treating GAD by providing support, education, and guidance. Options include: ? Cognitive behavioral therapy (CBT). People learn coping skills and techniques to ease their anxiety. They learn to identify unrealistic or negative thoughts and behaviors and to replace them with positive ones. ? Acceptance and commitment therapy (ACT). This treatment teaches people how to be mindful as a way to cope with unwanted thoughts and feelings. ? Biofeedback. This process trains you to manage your body's response (  physiological response) through breathing techniques and relaxation methods. You will work with a therapist while machines are used to monitor your physical symptoms.  Stress  management techniques. These include yoga, meditation, and exercise. A mental health specialist can help determine which treatment is best for you. Some people see improvement with one type of therapy. However, other people require a combination of therapies. Follow these instructions at home:  Take over-the-counter and prescription medicines only as told by your health care provider.  Try to maintain a normal routine.  Try to anticipate stressful situations and allow extra time to manage them.  Practice any stress management or self-calming techniques as taught by your health care provider.  Do not punish yourself for setbacks or for not making progress.  Try to recognize your accomplishments, even if they are small.  Keep all follow-up visits as told by your health care provider. This is important. Contact a health care provider if:  Your symptoms do not get better.  Your symptoms get worse.  You have signs of depression, such as: ? A persistently sad, cranky, or irritable mood. ? Loss of enjoyment in activities that used to bring you joy. ? Change in weight or eating. ? Changes in sleeping habits. ? Avoiding friends or family members. ? Loss of energy for normal tasks. ? Feelings of guilt or worthlessness. Get help right away if:  You have serious thoughts about hurting yourself or others. If you ever feel like you may hurt yourself or others, or have thoughts about taking your own life, get help right away. You can go to your nearest emergency department or call:  Your local emergency services (911 in the [place]  A suicide crisis helpline, such as the [facility] at 712-158-7503. This is open 24 hours a day. Summary  Generalized anxiety disorder (GAD) is a mental health disorder that involves worry that is not triggered by a specific event.  People with GAD often worry excessively about many things in their lives, such as their health and  family.  GAD may cause physical symptoms such as restlessness, trouble concentrating, sleep problems, frequent sweating, nausea, diarrhea, headaches, and trembling or muscle twitching.  A mental health specialist can help determine which treatment is best for you. Some people see improvement with one type of therapy. However, other people require a combination of therapies. This information is not intended to replace advice given to you by your health care provider. Make sure you discuss any questions you have with your health care provider. Document Revised: 09/30/2017 Document Reviewed: 09/07/2016 Elsevier Patient Education  [employer].    Buspirone tablets What is this medicine? BUSPIRONE (byoo SPYE rone) is used to treat anxiety disorders. This medicine may be used for other purposes; ask your health care provider or pharmacist if you have questions. COMMON BRAND NAME(S): BuSpar What should I tell my health care provider before I take this medicine? They need to know if you have any of these conditions:  kidney or liver disease  an unusual or allergic reaction to buspirone, other medicines, foods, dyes, or preservatives  pregnant or trying to get pregnant  breast-feeding How should I use this medicine? Take this medicine by mouth with a glass of water. Follow the directions on the prescription label. You may take this medicine with or without food. To ensure that this medicine always works the same way for you, you should take it either always with or always without food. Take your doses  at regular intervals. Do not take your medicine more often than directed. Do not stop taking except on the advice of your doctor or health care professional. Talk to your pediatrician regarding the use of this medicine in children. Special care may be needed. Overdosage: If you think you have taken too much of this medicine contact a poison control center or emergency room at once. NOTE:  This medicine is only for you. Do not share this medicine with others. What if I miss a dose? If you miss a dose, take it as soon as you can. If it is almost time for your next dose, take only that dose. Do not take double or extra doses. What may interact with this medicine? Do not take this medicine with any of the following medications:  linezolid  MAOIs like Carbex, Eldepryl, Marplan, Nardil, and Parnate  methylene blue  procarbazine This medicine may also interact with the following medications:  diazepam  digoxin  diltiazem  erythromycin  grapefruit juice  haloperidol  medicines for mental depression or mood problems  medicines for seizures like carbamazepine, phenobarbital and phenytoin  nefazodone  other medications for anxiety  rifampin  ritonavir  some antifungal medicines like itraconazole, ketoconazole, and voriconazole  verapamil  warfarin This list may not describe all possible interactions. Give your health care provider a list of all the medicines, herbs, non-prescription drugs, or dietary supplements you use. Also tell them if you smoke, drink alcohol, or use illegal drugs. Some items may interact with your medicine. What should I watch for while using this medicine? Visit your doctor or health care professional for regular checks on your progress. It may take 1 to 2 weeks before your anxiety gets better. You may get drowsy or dizzy. Do not drive, use machinery, or do anything that needs mental alertness until you know how this drug affects you. Do not stand or sit up quickly, especially if you are an older patient. This reduces the risk of dizzy or fainting spells. Alcohol can make you more drowsy and dizzy. Avoid alcoholic drinks. What side effects may I notice from receiving this medicine? Side effects that you should report to your doctor or health care professional as soon as possible:  blurred vision or other vision changes  chest  pain  confusion  difficulty breathing  feelings of hostility or anger  muscle aches and pains  numbness or tingling in hands or feet  ringing in the ears  skin rash and itching  vomiting  weakness Side effects that usually do not require medical attention (report to your doctor or health care professional if they continue or are bothersome):  disturbed dreams, nightmares  headache  nausea  restlessness or nervousness  sore throat and nasal congestion  stomach upset This list may not describe all possible side effects. Call your doctor for medical advice about side effects. You may report side effects to FDA at 1-800-FDA-1088. Where should I keep my medicine? Keep out of the reach of children. Store at room temperature below 30 degrees C (86 degrees F). Protect from light. Keep container tightly closed. Throw away any unused medicine after the expiration date. NOTE: This sheet is a summary. It may not cover all possible information. If you have questions about this medicine, talk to your doctor, pharmacist, or health care provider.  2020 Elsevier/Gold Standard (2010-05-28 18:06:11)   Ethinyl Estradiol; Norethindrone Acetate; Ferrous fumarate tablets or capsules What is this medicine? ETHINYL ESTRADIOL; NORETHINDRONE ACETATE; FERROUS FUMARATE (ETH in  il es tra DYE ole; nor eth IN drone AS e tate; FER [place] FUE ma rate) is an oral contraceptive. The products combine two types of female hormones, an estrogen and a progestin. They are used to prevent ovulation and pregnancy. Some products are also used to treat acne in females. This medicine may be used for other purposes; ask your health care provider or pharmacist if you have questions. COMMON BRAND NAME(S): Aurovela [address] 1/20, Aurovela Fe, [place] [address], [address] Fe, [place] Fe, Gildess 24 Fe, Gildess Fe 1.5/30, Gildess Fe 1/20, Hailey 24 Fe, Hailey Fe 1.5/30, Junel Fe 1.5/30, Junel Fe 1/20, Jun[place] Fe 24, [place] Fe, Lo  Loestrin Fe, Loestrin 24 Fe, Loestrin FE 1.5/30, Loestrin FE 1/20, [place] 24 Fe, Microgestin 24 Fe, Microgestin Fe 1.5/30, Microgestin Fe 1/20, Tarina 24 Fe, Tarina Fe 1/20, [place], Tilia Fe, Tri-Legest Fe What should I tell my health care provider before I take this medicine? They need to know if you have any of these conditions:  abnormal vaginal bleeding  blood vessel disease  breast, cervical, endometrial, ovarian, liver, or uterine cancer  diabetes  gallbladder disease  heart disease or recent heart attack  high blood pressure  high cholesterol  history of blood clots  kidney disease  liver disease  migraine headaches  smoke tobacco  stroke  systemic lupus erythematosus (SLE)  an unusual or allergic reaction to estrogens, progestins, other medicines, foods, dyes, or preservatives  pregnant or trying to get pregnant  breast-feeding How should I use this medicine? Take this medicine by mouth. To reduce nausea, this medicine may be taken with food. Follow the directions on the prescription label. Take this medicine at the same time each day and in the order directed on the package. Do not take your medicine more often than directed. A patient package insert for the product will be given with each prescription and refill. Read this sheet carefully each time. The sheet may change frequently. Contact your pediatrician regarding the use of this medicine in children. Special care may be needed. This medicine has been used in female children who have started having menstrual periods. Overdosage: If you think you have taken too much of this medicine contact a poison control center or emergency room at once. NOTE: This medicine is only for you. Do not share this medicine with others. What if I miss a dose? If you miss a dose, refer to the patient information sheet you received with your medicine for direction. If you miss more than one pill, this medicine may not be as  effective and you may need to use another form of birth control. What may interact with this medicine? Do not take this medicine with the following medication:  dasabuvir; ombitasvir; paritaprevir; ritonavir  ombitasvir; paritaprevir; ritonavir This medicine may also interact with the following medications:  acetaminophen  antibiotics or medicines for infections, especially rifampin, rifabutin, rifapentine, and griseofulvin, and possibly penicillins or tetracyclines  aprepitant  ascorbic acid (vitamin C)  atorvastatin  barbiturate medicines, such as phenobarbital  bosentan  carbamazepine  caffeine  clofibrate  cyclosporine  dantrolene  doxercalciferol  felbamate  grapefruit juice  hydrocortisone  medicines for anxiety or sleeping problems, such as diazepam or temazepam  medicines for diabetes, including pioglitazone  mineral oil  modafinil  mycophenolate  nefazodone  oxcarbazepine  phenytoin  prednisolone  ritonavir or other medicines for HIV infection or AIDS  rosuvastatin  selegiline  soy isoflavones supplements  St. John's wort  tamoxifen or raloxifene  theophylline  thyroid hormones  topiramate  warfarin This list may not describe all possible interactions. Give your health care provider a list of all the medicines, herbs, non-prescription drugs, or dietary supplements you use. Also tell them if you smoke, drink alcohol, or use illegal drugs. Some items may interact with your medicine. What should I watch for while using this medicine? Visit your doctor or health care professional for regular checks on your progress. You will need a regular breast and pelvic exam and Pap smear while on this medicine. Use an additional method of contraception during the first cycle that you take these tablets. If you have any reason to think you are pregnant, stop taking this medicine right away and contact your doctor or health care  professional. If you are taking this medicine for hormone related problems, it may take several cycles of use to see improvement in your condition. Smoking increases the risk of getting a blood clot or having a stroke while you are taking birth control pills, especially if you are more than 23 years old. You are strongly advised not to smoke. This medicine can make your body retain fluid, making your fingers, hands, or ankles swell. Your blood pressure can go up. Contact your doctor or health care professional if you feel you are retaining fluid. This medicine can make you more sensitive to the sun. Keep out of the sun. If you cannot avoid being in the sun, wear protective clothing and use sunscreen. Do not use sun lamps or tanning beds/booths. If you wear contact lenses and notice visual changes, or if the lenses begin to feel uncomfortable, consult your eye care specialist. In some women, tenderness, swelling, or minor bleeding of the gums may occur. Notify your dentist if this happens. Brushing and flossing your teeth regularly may help limit this. See your dentist regularly and inform your dentist of the medicines you are taking. If you are going to have elective surgery, you may need to stop taking this medicine before the surgery. Consult your health care professional for advice. This medicine does not protect you against HIV infection (AIDS) or any other sexually transmitted diseases. What side effects may I notice from receiving this medicine? Side effects that you should report to your doctor or health care professional as soon as possible:  allergic reactions like skin rash, itching or hives, swelling of the face, lips, or tongue  breast tissue changes or discharge  changes in vaginal bleeding during your period or between your periods  changes in vision  chest pain  confusion  coughing up blood  dizziness  feeling faint or lightheaded  headaches or migraines  leg, arm or  groin pain  loss of balance or coordination  severe or sudden headaches  stomach pain (severe)  sudden shortness of breath  sudden numbness or weakness of the face, arm or leg  symptoms of vaginal infection like itching, irritation or unusual discharge  tenderness in the upper abdomen  trouble speaking or understanding  vomiting  yellowing of the eyes or skin Side effects that usually do not require medical attention (report to your doctor or health care professional if they continue or are bothersome):  breakthrough bleeding and spotting that continues beyond the 3 initial cycles of pills  breast tenderness  mood changes, anxiety, depression, frustration, anger, or emotional outbursts  increased sensitivity to sun or ultraviolet light  nausea  skin rash, acne, or brown spots on the skin  weight gain (slight) This list  may not describe all possible side effects. Call your doctor for medical advice about side effects. You may report side effects to FDA at 1-800-FDA-1088. Where should I keep my medicine? Keep out of the reach of children. Store at room temperature between 15 and 30 degrees C (59 and 86 degrees F). Throw away any unused medicine after the expiration date. NOTE: This sheet is a summary. It may not cover all possible information. If you have questions about this medicine, talk to your doctor, pharmacist, or health care provider.  2020 Elsevier/Gold Standard (2016-06-28 08:04:41)

## 2020-05-29 ENCOUNTER — Telehealth: Payer: Self-pay | Admitting: Certified Nurse Midwife

## 2020-05-29 NOTE — Telephone Encounter (Signed)
Please apologize, urine culture NOT sent after last visit. May come in for drop off and will send today. Thanks, Dani Gobble, CNM

## 2020-05-29 NOTE — Telephone Encounter (Signed)
Patient called in stating that she is awaiting her urine culture results. Patient would like to know if they have came in yet.  Could you please advise?

## 2020-05-29 NOTE — Telephone Encounter (Signed)
Called patient. Apologized for what happened with her urine culture. Offered patient to come in today to drop off a urine sample- patient requested to come in tomorrow. Appointment has been made. Patient was very pleasant and expressed understanding.

## 2020-05-30 ENCOUNTER — Other Ambulatory Visit: Payer: Medicaid Other

## 2020-05-30 DIAGNOSIS — R399 Unspecified symptoms and signs involving the genitourinary system: Secondary | ICD-10-CM

## 2020-05-30 NOTE — Telephone Encounter (Signed)
Spoke with patient and she could not come in this morning due to work. She is going to come by this afternoon to drop off urine.

## 2020-06-01 LAB — URINE CULTURE

## 2020-07-11 ENCOUNTER — Ambulatory Visit (INDEPENDENT_AMBULATORY_CARE_PROVIDER_SITE_OTHER): Payer: Medicaid Other | Admitting: Certified Nurse Midwife

## 2020-07-11 ENCOUNTER — Other Ambulatory Visit: Payer: Self-pay

## 2020-07-11 ENCOUNTER — Encounter: Payer: Self-pay | Admitting: Certified Nurse Midwife

## 2020-07-11 VITALS — BP 122/68 | Wt 126.0 lb

## 2020-07-11 DIAGNOSIS — F419 Anxiety disorder, unspecified: Secondary | ICD-10-CM | POA: Diagnosis not present

## 2020-07-11 DIAGNOSIS — Z79899 Other long term (current) drug therapy: Secondary | ICD-10-CM | POA: Diagnosis not present

## 2020-07-11 DIAGNOSIS — F329 Major depressive disorder, single episode, unspecified: Secondary | ICD-10-CM | POA: Diagnosis not present

## 2020-07-11 DIAGNOSIS — F32A Depression, unspecified: Secondary | ICD-10-CM

## 2020-07-11 MED ORDER — SERTRALINE HCL 50 MG PO TABS: ORAL_TABLET | ORAL | 1 refills | Status: DC

## 2020-07-11 NOTE — Patient Instructions (Addendum)
Sertraline tablets What is this medicine? SERTRALINE (SER tra leen) is used to treat depression. It may also be used to treat obsessive compulsive disorder, panic disorder, post-trauma stress, premenstrual dysphoric disorder (PMDD) or social anxiety. This medicine may be used for other purposes; ask your health care provider or pharmacist if you have questions. COMMON BRAND NAME(S): Zoloft What should I tell my health care provider before I take this medicine? They need to know if you have any of these conditions:  bleeding disorders  bipolar disorder or a family history of bipolar disorder  glaucoma  heart disease  high blood pressure  history of irregular heartbeat  history of low levels of calcium, magnesium, or potassium in the blood  if you often drink alcohol  liver disease  receiving electroconvulsive therapy  seizures  suicidal thoughts, plans, or attempt; a previous suicide attempt by you or a family member  take medicines that treat or prevent blood clots  thyroid disease  an unusual or allergic reaction to sertraline, other medicines, foods, dyes, or preservatives  pregnant or trying to get pregnant  breast-feeding How should I use this medicine? Take this medicine by mouth with a glass of water. Follow the directions on the prescription label. You can take it with or without food. Take your medicine at regular intervals. Do not take your medicine more often than directed. Do not stop taking this medicine suddenly except upon the advice of your doctor. Stopping this medicine too quickly may cause serious side effects or your condition may worsen. A special MedGuide will be given to you by the pharmacist with each prescription and refill. Be sure to read this information carefully each time. Talk to your pediatrician regarding the use of this medicine in children. While this drug may be prescribed for children as young as 7 years for selected conditions,  precautions do apply. Overdosage: If you think you have taken too much of this medicine contact a poison control center or emergency room at once. NOTE: This medicine is only for you. Do not share this medicine with others. What if I miss a dose? If you miss a dose, take it as soon as you can. If it is almost time for your next dose, take only that dose. Do not take double or extra doses. What may interact with this medicine? Do not take this medicine with any of the following medications:  cisapride  dronedarone  linezolid  MAOIs like Carbex, Eldepryl, Marplan, Nardil, and Parnate  methylene blue (injected into a vein)  pimozide  thioridazine This medicine may also interact with the following medications:  alcohol  amphetamines  aspirin and aspirin-like medicines  certain medicines for depression, anxiety, or psychotic disturbances  certain medicines for fungal infections like ketoconazole, fluconazole, posaconazole, and itraconazole  certain medicines for irregular heart beat like flecainide, quinidine, propafenone  certain medicines for migraine headaches like almotriptan, eletriptan, frovatriptan, naratriptan, rizatriptan, sumatriptan, zolmitriptan  certain medicines for sleep  certain medicines for seizures like carbamazepine, valproic acid, phenytoin  certain medicines that treat or prevent blood clots like warfarin, enoxaparin, dalteparin  cimetidine  digoxin  diuretics  fentanyl  isoniazid  lithium  NSAIDs, medicines for pain and inflammation, like ibuprofen or naproxen  other medicines that prolong the QT interval (cause an abnormal heart rhythm) like dofetilide  rasagiline  safinamide  supplements like St. John's wort, kava kava, valerian  tolbutamide  tramadol  tryptophan This list may not describe all possible interactions. Give your health care provider  a list of all the medicines, herbs, non-prescription drugs, or dietary supplements  you use. Also tell them if you smoke, drink alcohol, or use illegal drugs. Some items may interact with your medicine. What should I watch for while using this medicine? Tell your doctor if your symptoms do not get better or if they get worse. Visit your doctor or health care professional for regular checks on your progress. Because it may take several weeks to see the full effects of this medicine, it is important to continue your treatment as prescribed by your doctor. Patients and their families should watch out for new or worsening thoughts of suicide or depression. Also watch out for sudden changes in feelings such as feeling anxious, agitated, panicky, irritable, hostile, aggressive, impulsive, severely restless, overly excited and hyperactive, or not being able to sleep. If this happens, especially at the beginning of treatment or after a change in dose, call your health care professional. Dennis Bast may get drowsy or dizzy. Do not drive, use machinery, or do anything that needs mental alertness until you know how this medicine affects you. Do not stand or sit up quickly, especially if you are an older patient. This reduces the risk of dizzy or fainting spells. Alcohol may interfere with the effect of this medicine. Avoid alcoholic drinks. Your mouth may get dry. Chewing sugarless gum or sucking hard candy, and drinking plenty of water may help. Contact your doctor if the problem does not go away or is severe. What side effects may I notice from receiving this medicine? Side effects that you should report to your doctor or health care professional as soon as possible:  allergic reactions like skin rash, itching or hives, swelling of the face, lips, or tongue  anxious  black, tarry stools  changes in vision  confusion  elevated mood, decreased need for sleep, racing thoughts, impulsive behavior  eye pain  fast, irregular heartbeat  feeling faint or lightheaded, falls  feeling agitated,  angry, or irritable  hallucination, loss of contact with reality  loss of balance or coordination  loss of memory  painful or prolonged erections  restlessness, pacing, inability to keep still  seizures  stiff muscles  suicidal thoughts or other mood changes  trouble sleeping  unusual bleeding or bruising  unusually weak or tired  vomiting Side effects that usually do not require medical attention (report to your doctor or health care professional if they continue or are bothersome):  change in appetite or weight  change in sex drive or performance  diarrhea  increased sweating  indigestion, nausea  tremors This list may not describe all possible side effects. Call your doctor for medical advice about side effects. You may report side effects to FDA at 1-800-FDA-1088. Where should I keep my medicine? Keep out of the reach of children. Store at room temperature between 15 and 30 degrees C (59 and 86 degrees F). Throw away any unused medicine after the expiration date. NOTE: This sheet is a summary. It may not cover all possible information. If you have questions about this medicine, talk to your doctor, pharmacist, or health care provider.  2020 Elsevier/Gold Standard (2018-10-10 10:09:27)   Generalized Anxiety Disorder, Adult Generalized anxiety disorder (GAD) is a mental health disorder. People with this condition constantly worry about everyday events. Unlike normal anxiety, worry related to GAD is not triggered by a specific event. These worries also do not fade or get better with time. GAD interferes with life functions, including relationships, work,  and school. GAD can vary from mild to severe. People with severe GAD can have intense waves of anxiety with physical symptoms (panic attacks). What are the causes? The exact cause of GAD is not known. What increases the risk? This condition is more likely to develop in:  Women.  People who have a family  history of anxiety disorders.  People who are very shy.  People who experience very stressful life events, such as the death of a loved one.  People who have a very stressful family environment. What are the signs or symptoms? People with GAD often worry excessively about many things in their lives, such as their health and family. They may also be overly concerned about:  Doing well at work.  Being on time.  Natural disasters.  Friendships. Physical symptoms of GAD include:  Fatigue.  Muscle tension or having muscle twitches.  Trembling or feeling shaky.  Being easily startled.  Feeling like your heart is pounding or racing.  Feeling out of breath or like you cannot take a deep breath.  Having trouble falling asleep or staying asleep.  Sweating.  Nausea, diarrhea, or irritable bowel syndrome (IBS).  Headaches.  Trouble concentrating or remembering facts.  Restlessness.  Irritability. How is this diagnosed? Your health care provider can diagnose GAD based on your symptoms and medical history. You will also have a physical exam. The health care provider will ask specific questions about your symptoms, including how severe they are, when they started, and if they come and go. Your health care provider may ask you about your use of alcohol or drugs, including prescription medicines. Your health care provider may refer you to a mental health specialist for further evaluation. Your health care provider will do a thorough examination and may perform additional tests to rule out other possible causes of your symptoms. To be diagnosed with GAD, a person must have anxiety that:  Is out of his or her control.  Affects several different aspects of his or her life, such as work and relationships.  Causes distress that makes him or her unable to take part in normal activities.  Includes at least three physical symptoms of GAD, such as restlessness, fatigue, trouble  concentrating, irritability, muscle tension, or sleep problems. Before your health care provider can confirm a diagnosis of GAD, these symptoms must be present more days than they are not, and they must last for six months or longer. How is this treated? The following therapies are usually used to treat GAD:  Medicine. Antidepressant medicine is usually prescribed for long-term daily control. Antianxiety medicines may be added in severe cases, especially when panic attacks occur.  Talk therapy (psychotherapy). Certain types of talk therapy can be helpful in treating GAD by providing support, education, and guidance. Options include: ? Cognitive behavioral therapy (CBT). People learn coping skills and techniques to ease their anxiety. They learn to identify unrealistic or negative thoughts and behaviors and to replace them with positive ones. ? Acceptance and commitment therapy (ACT). This treatment teaches people how to be mindful as a way to cope with unwanted thoughts and feelings. ? Biofeedback. This process trains you to manage your body's response (physiological response) through breathing techniques and relaxation methods. You will work with a therapist while machines are used to monitor your physical symptoms.  Stress management techniques. These include yoga, meditation, and exercise. A mental health specialist can help determine which treatment is best for you. Some people see improvement with one type of  therapy. However, other people require a combination of therapies. Follow these instructions at home:  Take over-the-counter and prescription medicines only as told by your health care provider.  Try to maintain a normal routine.  Try to anticipate stressful situations and allow extra time to manage them.  Practice any stress management or self-calming techniques as taught by your health care provider.  Do not punish yourself for setbacks or for not making progress.  Try to  recognize your accomplishments, even if they are small.  Keep all follow-up visits as told by your health care provider. This is important. Contact a health care provider if:  Your symptoms do not get better.  Your symptoms get worse.  You have signs of depression, such as: ? A persistently sad, cranky, or irritable mood. ? Loss of enjoyment in activities that used to bring you joy. ? Change in weight or eating. ? Changes in sleeping habits. ? Avoiding friends or family members. ? Loss of energy for normal tasks. ? Feelings of guilt or worthlessness. Get help right away if:  You have serious thoughts about hurting yourself or others. If you ever feel like you may hurt yourself or others, or have thoughts about taking your own life, get help right away. You can go to your nearest emergency department or call:  Your local emergency services (911 in the [place]  A suicide crisis helpline, such as the [facility] at 423-881-8293. This is open 24 hours a day. Summary  Generalized anxiety disorder (GAD) is a mental health disorder that involves worry that is not triggered by a specific event.  People with GAD often worry excessively about many things in their lives, such as their health and family.  GAD may cause physical symptoms such as restlessness, trouble concentrating, sleep problems, frequent sweating, nausea, diarrhea, headaches, and trembling or muscle twitching.  A mental health specialist can help determine which treatment is best for you. Some people see improvement with one type of therapy. However, other people require a combination of therapies. This information is not intended to replace advice given to you by your health care provider. Make sure you discuss any questions you have with your health care provider. Document Revised: 09/30/2017 Document Reviewed: 09/07/2016 Elsevier Patient Education  2020 [employer].

## 2020-07-11 NOTE — Progress Notes (Signed)
GYN ENCOUNTER NOTE  Subjective:       Kathleen Glass is a 24 y.o. G50P1001 female here for management of anxiety medications while trying to conceive.   Notes Buspar only works for "like an hour" after dose taken.   Denies difficulty breathing or respiratory distress, chest pain, abdominal pain, excessive vaginal bleeding, dysuria, and leg pain or swelling.     Gynecologic History  Patient's last menstrual period was 06/03/2020 (approximate).  Contraception: Lo loestrin  Last Pap: Due  Obstetric History  OB History  Gravida Para Term Preterm AB Living  1 1 1     1   SAB TAB Ectopic Multiple Live Births        0 1    # Outcome Date GA Lbr Len/2nd Weight Sex Delivery Anes PTL Lv  1 Term 10/07/17 [redacted]w[redacted]d 18:47 / 01:49 6 lb 15.5 oz (3.16 kg) F Vag-Spont EPI  LIV    Obstetric Comments  1st Menstrual Cycle: 12    Past Medical History:  Diagnosis Date  . Urinary tract infection     Past Surgical History:  Procedure Laterality Date  . BREAST SURGERY Left 06-12-13   benign fibroadenoma    Current Outpatient Medications on File Prior to Visit  Medication Sig Dispense Refill  . busPIRone (BUSPAR) 5 MG tablet Take 1 tablet (5 mg total) by mouth 3 (three) times daily. 90 tablet 1  . medroxyPROGESTERone (DEPO-PROVERA) 150 MG/ML injection Inject 1 mL (150 mg total) into the muscle every 3 (three) months. (Patient not taking: Reported on 07/11/2020) 1 mL 4   No current facility-administered medications on file prior to visit.    No Known Allergies  Social History   Socioeconomic History  . Marital status: Single    Spouse name: Not on file  . Number of children: Not on file  . Years of education: Not on file  . Highest education level: Not on file  Occupational History  . Not on file  Tobacco Use  . Smoking status: Former Smoker    Packs/day: 0.50    Types: Cigarettes  . Smokeless tobacco: Never Used  Vaping Use  . Vaping Use: Never used  Substance and Sexual Activity   . Alcohol use: No  . Drug use: No  . Sexual activity: Yes    Birth control/protection: Condom, Pill  Other Topics Concern  . Not on file  Social History Narrative  . Not on file   Social Determinants of Health   Financial Resource Strain:   . Difficulty of Paying Living Expenses: Not on file  Food Insecurity:   . Worried About Charity fundraiser in the Last Year: Not on file  . Ran Out of Food in the Last Year: Not on file  Transportation Needs:   . Lack of Transportation (Medical): Not on file  . Lack of Transportation (Non-Medical): Not on file  Physical Activity:   . Days of Exercise per Week: Not on file  . Minutes of Exercise per Session: Not on file  Stress:   . Feeling of Stress : Not on file  Social Connections:   . Frequency of Communication with Friends and Family: Not on file  . Frequency of Social Gatherings with Friends and Family: Not on file  . Attends Religious Services: Not on file  . Active Member of Clubs or Organizations: Not on file  . Attends Archivist Meetings: Not on file  . Marital Status: Not on file  Intimate Partner Violence:   .  Fear of Current or Ex-Partner: Not on file  . Emotionally Abused: Not on file  . Physically Abused: Not on file  . Sexually Abused: Not on file    Family History  Problem Relation Age of Onset  . Breast cancer Other   . Breast cancer Paternal Aunt        great aunt  . Diabetes Mother   . Diabetes Maternal Grandfather   . Bipolar disorder Father   . Diabetes Maternal Grandmother   . Ovarian cancer Neg Hx   . Colon cancer Neg Hx     The following portions of the patient's history were reviewed and updated as appropriate: allergies, current medications, past family history, past medical history, past social history, past surgical history and problem list.  Review of Systems  ROS negative except as noted above. Information obtained from patient.   Objective:   BP 122/68   Wt 126 lb (57.2 kg)    LMP 06/03/2020 (Approximate)   BMI 23.05 kg/m    CONSTITUTIONAL: Well-developed, well-nourished female in no acute distress.   PHYSICAL EXAM: Not indicated.   GAD 7 : Generalized Anxiety Score 07/11/2020 05/26/2020 06/28/2019 01/04/2019  Nervous, Anxious, on Edge 3 3 0 1  Control/stop worrying 2 2 0 3  Worry too much - different things 2 2 0 3  Trouble relaxing 3 2 0 3  Restless 3 2 0 3  Easily annoyed or irritable 3 3 3 3   Afraid - awful might happen 3 0 0 0  Total GAD 7 Score 19 14 3 16   Anxiety Difficulty Somewhat difficult Somewhat difficult Not difficult at all Somewhat difficult   Depression screen [facility] 2/9 07/11/2020 06/28/2019 01/04/2019 11/04/2017  Decreased Interest 2 0 3 3  Down, Depressed, Hopeless 1 0 2 0  PHQ - 2 Score 3 0 5 3  Altered sleeping 2 0 2 0  Tired, decreased energy 2 0 3 0  Change in appetite 2 1 2  0  Feeling bad or failure about yourself  2 0 1 0  Trouble concentrating 2 0 0 0  Moving slowly or fidgety/restless 2 0 0 0  Suicidal thoughts 0 0 0 0  PHQ-9 Score 15 1 13 3   Difficult doing work/chores Somewhat difficult Not difficult at all Somewhat difficult -    Assessment:   1. Anxiety and depression   2. Medication management   Plan:   Negative rapid mood screener negative. PHQ-9 and GAD-7 results discussed with patient.   Dr. Marcelline Mates consulted.   Rx Zoloft, see orders.   Continue Buspar as prescribed.   Reviewed red flag symptoms and when to call.   RTC x 4-6 weeks for mood check or sooner if needed.    Dani Gobble, CNM [facility], [facility] 07/11/20 3:22 PM   I spent 20 minutes dedicated to the care of this patient on the date of this encounter to include pre-visit review of records, physician consultation, face to face time with the patient, and post visit ordering.

## 2020-07-13 DIAGNOSIS — F419 Anxiety disorder, unspecified: Secondary | ICD-10-CM | POA: Insufficient documentation

## 2020-07-13 DIAGNOSIS — F32A Depression, unspecified: Secondary | ICD-10-CM | POA: Insufficient documentation

## 2020-08-22 ENCOUNTER — Encounter: Payer: Medicaid Other | Admitting: Certified Nurse Midwife

## 2020-09-10 ENCOUNTER — Telehealth: Payer: Self-pay

## 2020-09-10 NOTE — Telephone Encounter (Signed)
Pt requesting a referral to see a therapist. Informed her she can schedule an appointment with Sharyn Lull. Pt states the zoloft that was prescribed at her last visit is making her depression worse. States feeling empty, alone and not well.  PHQ-9 and GAD-7 screenings done. PHQ-9: 18 GAD-7: 20 Pt states she is constantly worrying about different things and easily gets irritable. Denies suicidal thoughts or thoughts of harming herself. Offered her the soonest appointment with [facility] on 09/22/20. Pt stated that is too far out. Informed her I would send a message to [facility]. Pt verbalized understanding.

## 2020-09-10 NOTE — Telephone Encounter (Signed)
Patient called in requesting a referral be placed for her to see a therapist for her anxiety.  Could you please advise?

## 2020-09-12 ENCOUNTER — Telehealth: Payer: Self-pay

## 2020-09-12 NOTE — Telephone Encounter (Signed)
Offered pt an appointment Monday 09/15/20 at 8:45 am. Pt agreed however, requested a televist since she is working. Added her to schedule. Pt verbalized understanding.

## 2020-09-12 NOTE — Telephone Encounter (Signed)
Go ahead and move up appointment. May use pump block. Thanks, JML

## 2020-09-15 ENCOUNTER — Encounter: Payer: Self-pay | Admitting: Certified Nurse Midwife

## 2020-09-15 ENCOUNTER — Ambulatory Visit (INDEPENDENT_AMBULATORY_CARE_PROVIDER_SITE_OTHER): Payer: Medicaid Other | Admitting: Certified Nurse Midwife

## 2020-09-15 ENCOUNTER — Other Ambulatory Visit: Payer: Self-pay

## 2020-09-15 DIAGNOSIS — F419 Anxiety disorder, unspecified: Secondary | ICD-10-CM

## 2020-09-15 DIAGNOSIS — F32A Depression, unspecified: Secondary | ICD-10-CM

## 2020-09-15 MED ORDER — BUSPIRONE HCL 10 MG PO TABS: 10.0000 mg | ORAL_TABLET | Freq: Three times a day (TID) | ORAL | 1 refills | Status: DC

## 2020-09-15 NOTE — Patient Instructions (Signed)
Buspirone tablets What is this medicine? BUSPIRONE (byoo SPYE rone) is used to treat anxiety disorders. This medicine may be used for other purposes; ask your health care provider or pharmacist if you have questions. COMMON BRAND NAME(S): BuSpar What should I tell my health care provider before I take this medicine? They need to know if you have any of these conditions:  kidney or liver disease  an unusual or allergic reaction to buspirone, other medicines, foods, dyes, or preservatives  pregnant or trying to get pregnant  breast-feeding How should I use this medicine? Take this medicine by mouth with a glass of water. Follow the directions on the prescription label. You may take this medicine with or without food. To ensure that this medicine always works the same way for you, you should take it either always with or always without food. Take your doses at regular intervals. Do not take your medicine more often than directed. Do not stop taking except on the advice of your doctor or health care professional. Talk to your pediatrician regarding the use of this medicine in children. Special care may be needed. Overdosage: If you think you have taken too much of this medicine contact a poison control center or emergency room at once. NOTE: This medicine is only for you. Do not share this medicine with others. What if I miss a dose? If you miss a dose, take it as soon as you can. If it is almost time for your next dose, take only that dose. Do not take double or extra doses. What may interact with this medicine? Do not take this medicine with any of the following medications:  linezolid  MAOIs like Carbex, Eldepryl, Marplan, Nardil, and Parnate  methylene blue  procarbazine This medicine may also interact with the following medications:  diazepam  digoxin  diltiazem  erythromycin  grapefruit juice  haloperidol  medicines for mental depression or mood problems  medicines  for seizures like carbamazepine, phenobarbital and phenytoin  nefazodone  other medications for anxiety  rifampin  ritonavir  some antifungal medicines like itraconazole, ketoconazole, and voriconazole  verapamil  warfarin This list may not describe all possible interactions. Give your health care provider a list of all the medicines, herbs, non-prescription drugs, or dietary supplements you use. Also tell them if you smoke, drink alcohol, or use illegal drugs. Some items may interact with your medicine. What should I watch for while using this medicine? Visit your doctor or health care professional for regular checks on your progress. It may take 1 to 2 weeks before your anxiety gets better. You may get drowsy or dizzy. Do not drive, use machinery, or do anything that needs mental alertness until you know how this drug affects you. Do not stand or sit up quickly, especially if you are an older patient. This reduces the risk of dizzy or fainting spells. Alcohol can make you more drowsy and dizzy. Avoid alcoholic drinks. What side effects may I notice from receiving this medicine? Side effects that you should report to your doctor or health care professional as soon as possible:  blurred vision or other vision changes  chest pain  confusion  difficulty breathing  feelings of hostility or anger  muscle aches and pains  numbness or tingling in hands or feet  ringing in the ears  skin rash and itching  vomiting  weakness Side effects that usually do not require medical attention (report to your doctor or health care professional if they continue or   are bothersome):  disturbed dreams, nightmares  headache  nausea  restlessness or nervousness  sore throat and nasal congestion  stomach upset This list may not describe all possible side effects. Call your doctor for medical advice about side effects. You may report side effects to FDA at 1-800-FDA-1088. Where should I  keep my medicine? Keep out of the reach of children. Store at room temperature below 30 degrees C (86 degrees F). Protect from light. Keep container tightly closed. Throw away any unused medicine after the expiration date. NOTE: This sheet is a summary. It may not cover all possible information. If you have questions about this medicine, talk to your doctor, pharmacist, or health care provider.  2020 Elsevier/Gold Standard (2010-05-28 18:06:11)  

## 2020-09-15 NOTE — Progress Notes (Signed)
Virtual Visit via Telephone Note  I connected with Kathleen Glass on 09/15/20 at  8:45 AM EST by telephone and verified that I am speaking with the correct person using two identifiers.  Location:  Patient: Kathleen Glass (home)  Provider: Dani Gobble, CNM ([facility], [facility])   I discussed the limitations, risks, security and privacy concerns of performing an evaluation and management service by telephone and the availability of in person appointments. I also discussed with the patient that there may be a patient responsible charge related to this service. The patient expressed understanding and agreed to proceed.   History of Present Illness:  Patient called for follow up mood check. Last seen in office for management of anxiety and depression on 07/11/2020; for further details, please see previous note.   No longer taking Zoloft due to "feelings of despair". Taking Buspar 10-15 mg PO two (2) to three (3) times per day.   Requests referral for counseling. Denies HI/SI.    Observations/Objective:  Depression screen [facility] 2/9 09/15/2020 07/11/2020 06/28/2019 01/04/2019 11/04/2017  Decreased Interest 2 2 0 3 3  Down, Depressed, Hopeless 1 1 0 2 0  PHQ - 2 Score 3 3 0 5 3  Altered sleeping 1 2 0 2 0  Tired, decreased energy 2 2 0 3 0  Change in appetite 2 2 1 2  0  Feeling bad or failure about yourself  1 2 0 1 0  Trouble concentrating 1 2 0 0 0  Moving slowly or fidgety/restless 1 2 0 0 0  Suicidal thoughts 0 0 0 0 0  PHQ-9 Score 11 15 1 13 3   Difficult doing work/chores - Somewhat difficult Not difficult at all Somewhat difficult -   GAD 7 : Generalized Anxiety Score 09/15/2020 07/11/2020 05/26/2020 06/28/2019  Nervous, Anxious, on Edge 1 3 3  0  Control/stop worrying 2 2 2  0  Worry too much - different things 2 2 2  0  Trouble relaxing 2 3 2  0  Restless 2 3 2  0  Easily annoyed or irritable 2 3 3 3   Afraid - awful might happen 2 3 0 0  Total GAD 7 Score 13 19 14 3    Anxiety Difficulty - Somewhat difficult Somewhat difficult Not difficult at all     Assessment:  Anxiety and Depression  Plan:  Rx Buspar, see orders.   Referral to [facility], see orders.   Reviewed red flag symptoms and when to call.   RTC if symptoms worsen or fail to improve.   Follow Up Instructions:    I discussed the assessment and treatment plan with the patient. The patient was provided an opportunity to ask questions and all were answered. The patient agreed with the plan and demonstrated an understanding of the instructions.   The patient was advised to call back or seek an in-person evaluation if the symptoms worsen or if the condition fails to improve as anticipated.  I provided 10 minutes of non-face-to-face time during this encounter.   Dani Gobble, CNM [facility], [facility] 09/15/20 9:09 AM

## 2020-09-22 ENCOUNTER — Encounter: Payer: Self-pay | Admitting: Certified Nurse Midwife

## 2020-11-01 NOTE — L&D Delivery Note (Signed)
       Delivery Note   Shanira Guster is a 25 y.o. G2P1001 at 7w0dEstimated Date of Delivery: 07/14/21  PRE-OPERATIVE DIAGNOSIS:  1) 451w0dregnancy.    POST-OPERATIVE DIAGNOSIS:  1) 4046w0degnancy s/p Vaginal, Spontaneous    Delivery Type: Vaginal, Spontaneous    Delivery Anesthesia: Epidural   Labor Complications: none     ESTIMATED BLOOD LOSS: 200  ml    FINDINGS:   1) female infant, Apgar scores of 8   at 1 minute and 9   at 5 minutes and a birthweight of 127.34  ounces.    2) Nuchal cord: no  SPECIMENS:   PLACENTA:   Appearance: Intact , 3 vessel cord   Removal: Spontaneous      Disposition:  per protocol   DISPOSITION:  Infant to left in stable condition in the delivery room, with L&D personnel and mother,  NARRATIVE SUMMARY: Labor course:  Ms. TaySanii Gagen a G2P1001 at 40w43w0d presented for PROM.  She progressed well in labor with pitocin.  She received the appropriate epidural anesthesia and proceeded to complete dilation. She evidenced good maternal expulsive effort during the second stage. She went on to deliver a viable female infant. The placenta delivered without problems and was noted to be complete. A perineal and vaginal examination was performed. Episiotomy/Lacerations: Periurethral abrasions hemostatic not in need of repair. Pt and family bonding with infant.    AnniPhilip AspenM  07/14/2021 6:18 PM

## 2020-11-18 ENCOUNTER — Ambulatory Visit: Payer: Medicaid Other

## 2020-11-24 ENCOUNTER — Other Ambulatory Visit: Payer: Self-pay

## 2020-11-24 ENCOUNTER — Encounter: Payer: Self-pay | Admitting: Certified Nurse Midwife

## 2020-11-24 ENCOUNTER — Ambulatory Visit (INDEPENDENT_AMBULATORY_CARE_PROVIDER_SITE_OTHER): Payer: Medicaid Other | Admitting: Certified Nurse Midwife

## 2020-11-24 VITALS — BP 116/54 | HR 99 | Ht 62.0 in | Wt 114.0 lb

## 2020-11-24 DIAGNOSIS — Z789 Other specified health status: Secondary | ICD-10-CM | POA: Diagnosis not present

## 2020-11-24 DIAGNOSIS — N926 Irregular menstruation, unspecified: Secondary | ICD-10-CM | POA: Diagnosis not present

## 2020-11-24 DIAGNOSIS — F419 Anxiety disorder, unspecified: Secondary | ICD-10-CM

## 2020-11-24 DIAGNOSIS — Z3201 Encounter for pregnancy test, result positive: Secondary | ICD-10-CM | POA: Diagnosis not present

## 2020-11-24 DIAGNOSIS — F32A Depression, unspecified: Secondary | ICD-10-CM

## 2020-11-24 LAB — POCT URINE PREGNANCY: Preg Test, Ur: POSITIVE — AB

## 2020-11-24 NOTE — Progress Notes (Signed)
GYN ENCOUNTER NOTE  Subjective:       Kathleen Glass is a 25 y.o. G94P1001 female here for pregnancy confirmation.   Endorses nausea without vomiting and breast tenderness.   No longer taking anxiety medication. Reports decreased life stressors.   Taking a prenatal vitamin with folic acid and DHA.   Denies difficulty breathing or respiratory distress, chest paib, abdominal pain, vaginal bleeding, dysuria, and leg pain or swelling.    Gynecologic History  Patient's last menstrual period was 09/28/2020 (within weeks).   Estimated date of birth: 07/05/2020  Gestational age: 68 weeks 1 day  Contraception: none  Last Pap: due  Obstetric History  OB History  Gravida Para Term Preterm AB Living  1 1 1     1   SAB IAB Ectopic Multiple Live Births        0 1    # Outcome Date GA Lbr Len/2nd Weight Sex Delivery Anes PTL Lv  1 Term 10/07/17 [redacted]w[redacted]d 18:47 / 01:49 6 lb 15.5 oz (3.16 kg) F Vag-Spont EPI  LIV    Obstetric Comments  1st Menstrual Cycle: 12    Past Medical History:  Diagnosis Date  . Urinary tract infection     Past Surgical History:  Procedure Laterality Date  . BREAST SURGERY Left 06-12-13   benign fibroadenoma    Current Outpatient Medications on File Prior to Visit  Medication Sig Dispense Refill  . Prenatal Vit-Fe Fumarate-FA (PRENATAL VITAMINS PO) Take by mouth.    . busPIRone (BUSPAR) 10 MG tablet Take 1 tablet (10 mg total) by mouth 3 (three) times daily. (Patient not taking: Reported on 11/24/2020) 90 tablet 1  . sertraline (ZOLOFT) 50 MG tablet Take 1 tablet (50 mg total) by mouth daily for 7 days, THEN 2 tablets (100 mg total) daily for 21 days. (Patient not taking: Reported on 11/24/2020) 45 tablet 1   No current facility-administered medications on file prior to visit.    No Known Allergies  Social History   Socioeconomic History  . Marital status: Single    Spouse name: Not on file  . Number of children: Not on file  . Years of education: Not  on file  . Highest education level: Not on file  Occupational History  . Not on file  Tobacco Use  . Smoking status: Former Smoker    Packs/day: 0.50    Types: Cigarettes  . Smokeless tobacco: Never Used  Vaping Use  . Vaping Use: Never used  Substance and Sexual Activity  . Alcohol use: No  . Drug use: No  . Sexual activity: Yes  Other Topics Concern  . Not on file  Social History Narrative  . Not on file   Social Determinants of Health   Financial Resource Strain: Not on file  Food Insecurity: Not on file  Transportation Needs: Not on file  Physical Activity: Not on file  Stress: Not on file  Social Connections: Not on file  Intimate Partner Violence: Not on file    Family History  Problem Relation Age of Onset  . Breast cancer Other   . Breast cancer Paternal Aunt        great aunt  . Diabetes Mother   . Diabetes Maternal Grandfather   . Bipolar disorder Father   . Diabetes Maternal Grandmother   . Ovarian cancer Neg Hx   . Colon cancer Neg Hx     The following portions of the patient's history were reviewed and updated as appropriate: allergies, current  medications, past family history, past medical history, past social history, past surgical history and problem list.  Review of Systems  ROS negative except as noted above. Information obtained from patient.   Objective:   BP (!) 116/54   Pulse 99   Ht 5\' 2"  (1.575 m)   Wt 114 lb (51.7 kg)   LMP  (LMP Unknown)   BMI 20.85 kg/m    CONSTITUTIONAL: Well-developed, well-nourished female   PHYSICAL EXAM: Not indicated.   Recent Results (from the past 2160 hour(s))  POCT urine pregnancy     Status: Abnormal   Collection Time: 11/24/20  2:01 PM  Result Value Ref Range   Preg Test, Ur Positive (A) Negative   Depression screen [facility] 2/9 11/24/2020 09/15/2020 07/11/2020 06/28/2019 01/04/2019  Decreased Interest 1 2 2  0 3  Down, Depressed, Hopeless 0 1 1 0 2  PHQ - 2 Score 1 3 3  0 5  Altered sleeping 1 1 2  0  2  Tired, decreased energy 1 2 2  0 3  Change in appetite 0 2 2 1 2   Feeling bad or failure about yourself  0 1 2 0 1  Trouble concentrating 0 1 2 0 0  Moving slowly or fidgety/restless 0 1 2 0 0  Suicidal thoughts 0 0 0 0 0  PHQ-9 Score 3 11 15 1 13   Difficult doing work/chores Not difficult at all - Somewhat difficult Not difficult at all Somewhat difficult   GAD 7 : Generalized Anxiety Score 11/24/2020 09/15/2020 07/11/2020 05/26/2020  Nervous, Anxious, on Edge 0 1 3 3   Control/stop worrying 0 2 2 2   Worry too much - different things 0 2 2 2   Trouble relaxing 0 2 3 2   Restless 1 2 3 2   Easily annoyed or irritable 0 2 3 3   Afraid - awful might happen 0 2 3 0  Total GAD 7 Score 1 13 19 14   Anxiety Difficulty - - Somewhat difficult Somewhat difficult     Assessment:   1. Missed menses  - POCT urine pregnancy - Beta hCG quant (ref lab) - US OB LESS THAN 14 WEEKS WITH OB TRANSVAGINAL; Future  2. Date of last menstrual period (LMP) unknown  - US OB LESS THAN 14 WEEKS WITH OB TRANSVAGINAL; Future  3. Positive pregnancy test  - Beta hCG quant (ref lab) - US OB LESS THAN 14 WEEKS WITH OB TRANSVAGINAL; Future   4. Anxiety and depression   Plan:   First trimester education, see AVS.   Beta today, due to unsure LMP.   Reviewed red flag symptoms and when to call.   RTC x 2 weeks for dating/viability ultrasound and intake.   RTC x 4 weeks for NOB PE with JML or sooner if needed.    Dani Gobble, CNM [facility], [facility] 11/24/20 2:35 PM

## 2020-11-24 NOTE — Patient Instructions (Addendum)
[email or web address]">  First Trimester of Pregnancy  The first trimester of pregnancy starts on the first day of your last menstrual period until the end of week 12. This is also called months 1 through 3 of pregnancy. Body changes during your first trimester Your body goes through many changes during pregnancy. The changes usually return to normal after your baby is born. Physical changes  You may gain or lose weight.  Your breasts may grow larger and hurt. The area around your nipples may get darker.  Dark spots or blotches may develop on your face.  You may have changes in your hair. Health changes  You may feel like you might vomit (nauseous), and you may vomit.  You may have heartburn.  You may have headaches.  You may have trouble pooping (constipation).  Your gums may bleed. Other changes  You may get tired easily.  You may pee (urinate) more often.  Your menstrual periods will stop.  You may not feel hungry.  You may want to eat certain kinds of food.  You may have changes in your emotions from day to day.  You may have more dreams. Follow these instructions at home: Medicines  Take over-the-counter and prescription medicines only as told by your doctor. Some medicines are not safe during pregnancy.  Take a prenatal vitamin that contains at least 600 micrograms (mcg) of folic acid. Eating and drinking  Eat healthy meals that include: ? Fresh fruits and vegetables. ? Whole grains. ? Good sources of protein, such as meat, eggs, or tofu. ? Low-fat dairy products.  Avoid raw meat and unpasteurized juice, milk, and cheese.  If you feel like you may vomit, or you vomit: ? Eat 4 or 5 small meals a day instead of 3 large meals. ? Try eating a few soda crackers. ? Drink liquids between meals instead of during meals.  You may need to take these actions to prevent or treat trouble pooping: ? Drink enough fluids to keep your pee  (urine) pale yellow. ? Eat foods that are high in fiber. These include beans, whole grains, and fresh fruits and vegetables. ? Limit foods that are high in fat and sugar. These include fried or sweet foods. Activity  Exercise only as told by your doctor. Most people can do their usual exercise routine during pregnancy.  Stop exercising if you have cramps or pain in your lower belly (abdomen) or low back.  Do not exercise if it is too hot or too humid, or if you are in a place of great height (high altitude).  Avoid heavy lifting.  If you choose to, you may have sex unless your doctor tells you not to. Relieving pain and discomfort  Wear a good support bra if your breasts are sore.  Rest with your legs raised (elevated) if you have leg cramps or low back pain.  If you have bulging veins (varicose veins) in your legs: ? Wear support hose as told by your doctor. ? Raise your feet for 15 minutes, 3-4 times a day. ? Limit salt in your food. Safety  Wear your seat belt at all times when you are in a car.  Talk with your doctor if someone is hurting you or yelling at you.  Talk with your doctor if you are feeling sad or have thoughts of hurting yourself. Lifestyle  Do not use hot tubs, steam rooms, or saunas.  Do not douche. Do not use tampons or scented sanitary pads.  Do not  use herbal medicines, illegal drugs, or medicines that are not approved by your doctor. Do not drink alcohol. °· Do not smoke or use any products that contain nicotine or tobacco. If you need help quitting, ask your doctor. °· Avoid cat litter boxes and soil that is used by cats. These carry germs that can cause harm to the baby and can cause a loss of your baby by miscarriage or stillbirth. °General instructions °· Keep all follow-up visits. This is important. °· Ask for help if you need counseling or if you need help with nutrition. Your doctor can give you advice or tell you where to go for help. °· Visit your  dentist. At home, brush your teeth with a soft toothbrush. Floss gently. °· Write down your questions. Take them to your prenatal visits. °Where to find more information °· American Pregnancy Association: americanpregnancy.org °· American College of Obstetricians and Gynecologists: www.acog.org °· Office on Women's Health: womenshealth.gov/pregnancy °Contact a doctor if: °· You are dizzy. °· You have a fever. °· You have mild cramps or pressure in your lower belly. °· You have a nagging pain in your belly area. °· You continue to feel like you may vomit, you vomit, or you have watery poop (diarrhea) for 24 hours or longer. °· You have a bad-smelling fluid coming from your vagina. °· You have pain when you pee. °· You are exposed to a disease that spreads from person to person, such as chickenpox, measles, Zika virus, HIV, or hepatitis. °Get help right away if: °· You have spotting or bleeding from your vagina. °· You have very bad belly cramping or pain. °· You have shortness of breath or chest pain. °· You have any kind of injury, such as from a fall or a car crash. °· You have new or increased pain, swelling, or redness in an arm or leg. °Summary °· The first trimester of pregnancy starts on the first day of your last menstrual period until the end of week 12 (months 1 through 3). °· Eat 4 or 5 small meals a day instead of 3 large meals. °· Do not smoke or use any products that contain nicotine or tobacco. If you need help quitting, ask your doctor. °· Keep all follow-up visits. °This information is not intended to replace advice given to you by your health care provider. Make sure you discuss any questions you have with your health care provider. °Document Revised: 03/26/2020 Document Reviewed: 01/31/2020 °Elsevier Patient Education © 2021 Elsevier Inc[employer] ° ° ° °Morning Sickness ° °Morning sickness is when you feel like you may vomit (feel nauseous) during pregnancy. Sometimes, you may vomit. Morning sickness most  often happens in the morning, but it can also happen at any time of the day. Some women may have morning sickness that makes them vomit all the time. This is a more serious problem that needs treatment. °What are the causes? °The cause of this condition is not known. °What increases the risk? °· You had vomiting or a feeling like you may vomit before your pregnancy. °· You had morning sickness in another pregnancy. °· You are pregnant with more than one baby, such as twins. °What are the signs or symptoms? °· Feeling like you may vomit. °· Vomiting. °How is this treated? °Treatment is usually not needed for this condition. You may only need to change what you eat. In some cases, your doctor may give you some things to take for your condition. These include: °· Vitamin   B6 supplements. °· Medicines to treat the feeling that you may vomit. °· Ginger. °Follow these instructions at home: °Medicines °· Take over-the-counter and prescription medicines only as told by your doctor. Do not take any medicines until you talk with your doctor about them first. °· Take multivitamins before you get pregnant. These can stop or lessen the symptoms of morning sickness. °Eating and drinking °· Eat dry toast or crackers before getting out of bed. °· Eat 5 or 6 small meals a day. °· Eat dry and bland foods like rice and baked potatoes. °· Do not eat greasy, fatty, or spicy foods. °· Have someone cook for you if the smell of food causes you to vomit or to feel like you may vomit. °· If you feel like you may vomit after taking prenatal vitamins, take them at night or with a snack. °· Eat protein foods when you need a snack. Nuts, yogurt, and cheese are good choices. °· Drink fluids throughout the day. °· Try ginger ale made with real ginger, ginger tea made from fresh grated ginger, or ginger candies. °General instructions °· Do not smoke or use any products that contain nicotine or tobacco. If you need help quitting, ask your  doctor. °· Use an air purifier to keep the air in your house free of smells. °· Get lots of fresh air. °· Try to avoid smells that make you feel sick. °· Try wearing an acupressure wristband. This is a wristband that is used to treat seasickness. °· Try a treatment called acupuncture. In this treatment, a doctor puts needles into certain areas of your body to make you feel better. °Contact a doctor if: °· You need medicine to feel better. °· You feel dizzy or light-headed. °· You are losing weight. °Get help right away if: °· The feeling that you may vomit will not go away, or you cannot stop vomiting. °· You faint. °· You have very bad pain in your belly. °Summary °· Morning sickness is when you feel like you may vomit (feel nauseous) during pregnancy. °· You may feel sick in the morning, but you can feel this way at any time of the day. °· Making some changes to what you eat may help your symptoms go away. °This information is not intended to replace advice given to you by your health care provider. Make sure you discuss any questions you have with your health care provider. °Document Revised: 06/02/2020 Document Reviewed: 05/12/2020 °Elsevier Patient Education © 2021 Elsevier Inc. ° ° °Common Medications Safe in Pregnancy ° °Acne:      Constipation: ° Benzoyl Peroxide     Colace ° Clindamycin      Dulcolax Suppository ° Topica Erythromycin     Fibercon ° Salicylic Acid      Metamucil °        Miralax °AVOID:        Senakot  ° Accutane    Cough: ° Retin-A       Cough Drops ° Tetracycline      Phenergan w/ Codeine if Rx ° Minocycline      Robitussin (Plain & DM) ° °Antibiotics:     Crabs/Lice: ° Ceclor       RID ° Cephalosporins    AVOID: ° E-Mycins      Kwell ° Keflex ° Macrobid/Macrodantin   Diarrhea: ° Penicillin      Kao-Pectate ° Zithromax      Imodium AD °        PUSH   FLUIDS °AVOID:      ° Cipro     Fever: ° Tetracycline      Tylenol (Regular or Extra ° Minocycline       Strength) ° Levaquin      Extra  Strength-Do not          Exceed 8 tabs/24 hrs °Caffeine:       ° <200mg/day (equiv. To 1 cup of coffee or ° approx. 3 12 oz sodas)   °      Gas: °Cold/Hayfever:       Gas-X ° Benadryl      Mylicon ° Claritin       Phazyme ° **Claritin-D       ° Chlor-Trimeton    Headaches: ° Dimetapp      ASA-Free Excedrin ° Drixoral-Non-Drowsy     Cold Compress ° Mucinex (Guaifenasin)     Tylenol (Regular or Extra ° Sudafed/Sudafed-12 Hour     Strength) ° **Sudafed PE Pseudoephedrine  ° Tylenol Cold & Sinus    ° Vicks Vapor Rub ° Zyrtec ° °**AVOID if Problems With Blood Pressure ° ° ° ° ° ° ° ° °Heartburn: Avoid lying down for at least 1 hour after meals ° Aciphex     ° Maalox     Rash: ° Milk of Magnesia     Benadryl   ° Mylanta       1% Hydrocortisone Cream ° Pepcid ° Pepcid Complete   Sleep Aids: ° Prevacid      Ambien  ° Prilosec       Benadryl ° Rolaids       Chamomile Tea ° Tums (Limit 4/day)     Unisom °        Tylenol PM °        Warm milk-add vanilla or  °Hemorrhoids:       Sugar for taste ° Anusol/Anusol H.C. ° (RX: Analapram 2.5%)  Sugar Substitutes: ° Hydrocortisone OTC     Ok in moderation ° Preparation H     ° Tucks       ° Vaseline lotion applied to tissue with wiping   ° °Herpes:     Throat: ° Acyclovir      Oragel ° Famvir ° Valtrex     Vaccines: °        Flu Shot °Leg Cramps:       *Gardasil ° Benadryl      Hepatitis A °        Hepatitis B °Nasal Spray:       Pneumovax ° Saline Nasal Spray     Polio Booster °        Tetanus °Nausea:       Tuberculosis test or PPD ° Vitamin B6 25 mg TID   AVOID:   ° Dramamine      *Gardasil ° Emetrol       Live Poliovirus ° Ginger Root 250 mg QID    MMR (measles, mumps & ° High Complex Carbs @ Bedtime    rebella) ° Sea Bands-Accupressure    Varicella (Chickenpox) ° Unisom 1/2 tab TID     *No known complications  °         If received before °Pain:         Known pregnancy;  ° Darvocet       Resume series  after ° Lortab        Delivery ° Percocet    Yeast:  ° Tramadol        Gyne-lotrimin  Ultram       Monistat  Vicodin           MISC:         All Sunscreens           Hair Coloring/highlights          Insect Repellant's          (Including DEET)         Mystic Tans  

## 2020-11-24 NOTE — Progress Notes (Signed)
PT is present today for confirmation of pregnancy. Pt LMP unknown. UPT done today results were positive. Pt c/o morning sickness.  PHQ-9=3  GAD-7=1.

## 2020-11-25 LAB — BETA HCG QUANT (REF LAB): hCG Quant: 46277 m[IU]/mL

## 2020-12-09 ENCOUNTER — Other Ambulatory Visit: Payer: Self-pay

## 2020-12-09 ENCOUNTER — Ambulatory Visit (INDEPENDENT_AMBULATORY_CARE_PROVIDER_SITE_OTHER): Payer: Medicaid Other

## 2020-12-09 DIAGNOSIS — Z3481 Encounter for supervision of other normal pregnancy, first trimester: Secondary | ICD-10-CM | POA: Diagnosis not present

## 2020-12-09 DIAGNOSIS — Z789 Other specified health status: Secondary | ICD-10-CM | POA: Diagnosis not present

## 2020-12-09 DIAGNOSIS — N926 Irregular menstruation, unspecified: Secondary | ICD-10-CM

## 2020-12-09 DIAGNOSIS — Z3201 Encounter for pregnancy test, result positive: Secondary | ICD-10-CM | POA: Diagnosis not present

## 2020-12-09 DIAGNOSIS — Z3A09 9 weeks gestation of pregnancy: Secondary | ICD-10-CM | POA: Diagnosis not present

## 2020-12-23 ENCOUNTER — Telehealth: Payer: Self-pay | Admitting: Radiology

## 2020-12-23 ENCOUNTER — Encounter: Payer: Medicaid Other | Admitting: Obstetrics and Gynecology

## 2020-12-23 NOTE — Telephone Encounter (Signed)
Called patient to confirm that she will be at [facility] appointment today with Dr Ilda Basset. Patient is also scheduled at [facility] for prenatal care

## 2020-12-29 ENCOUNTER — Encounter: Payer: Medicaid Other | Admitting: Certified Nurse Midwife

## 2021-02-11 ENCOUNTER — Encounter: Payer: Medicaid Other | Admitting: Advanced Practice Midwife

## 2021-02-23 ENCOUNTER — Other Ambulatory Visit (HOSPITAL_COMMUNITY)
Admission: RE | Admit: 2021-02-23 | Discharge: 2021-02-23 | Disposition: A | Discharge status: Home / Self Care | Payer: Medicaid Other | Source: Ambulatory Visit | Attending: Certified Nurse Midwife | Admitting: Certified Nurse Midwife

## 2021-02-23 ENCOUNTER — Encounter: Payer: Self-pay | Admitting: Certified Nurse Midwife

## 2021-02-23 ENCOUNTER — Other Ambulatory Visit: Payer: Self-pay

## 2021-02-23 ENCOUNTER — Ambulatory Visit (INDEPENDENT_AMBULATORY_CARE_PROVIDER_SITE_OTHER): Payer: Medicaid Other | Admitting: Certified Nurse Midwife

## 2021-02-23 VITALS — BP 125/81 | HR 77 | Wt 130.9 lb

## 2021-02-23 DIAGNOSIS — O0930 Supervision of pregnancy with insufficient antenatal care, unspecified trimester: Secondary | ICD-10-CM | POA: Insufficient documentation

## 2021-02-23 DIAGNOSIS — Z3689 Encounter for other specified antenatal screening: Secondary | ICD-10-CM

## 2021-02-23 DIAGNOSIS — Z3482 Encounter for supervision of other normal pregnancy, second trimester: Secondary | ICD-10-CM | POA: Insufficient documentation

## 2021-02-23 DIAGNOSIS — Z113 Encounter for screening for infections with a predominantly sexual mode of transmission: Secondary | ICD-10-CM | POA: Diagnosis present

## 2021-02-23 DIAGNOSIS — Z130 Encounter for screening for diseases of the blood and blood-forming organs and certain disorders involving the immune mechanism: Secondary | ICD-10-CM

## 2021-02-23 DIAGNOSIS — Z87440 Personal history of urinary (tract) infections: Secondary | ICD-10-CM | POA: Diagnosis not present

## 2021-02-23 DIAGNOSIS — Z1379 Encounter for other screening for genetic and chromosomal anomalies: Secondary | ICD-10-CM

## 2021-02-23 DIAGNOSIS — Z3A19 19 weeks gestation of pregnancy: Secondary | ICD-10-CM

## 2021-02-23 DIAGNOSIS — Z124 Encounter for screening for malignant neoplasm of cervix: Secondary | ICD-10-CM | POA: Insufficient documentation

## 2021-02-23 MED ORDER — NITROFURANTOIN MONOHYD MACRO 100 MG PO CAPS: 100.0000 mg | ORAL_CAPSULE | Freq: Every day | ORAL | 6 refills | Status: DC

## 2021-02-23 NOTE — Patient Instructions (Addendum)
WHAT OB PATIENTS CAN EXPECT   Confirmation of pregnancy and ultrasound ordered if medically indicated-[redacted] weeks gestation  New OB (NOB) intake with nurse and New OB (NOB) labs- [redacted] weeks gestation  New OB (NOB) physical examination with provider- 11/[redacted] weeks gestation  Flu vaccine-[redacted] weeks gestation  Anatomy scan-[redacted] weeks gestation  Glucose tolerance test, blood work to test for anemia, T-dap vaccine-[redacted] weeks gestation  Vaginal swabs/cultures-STD/Group B strep-[redacted] weeks gestation  Appointments every 4 weeks until 28 weeks  Every 2 weeks from 28 weeks until 36 weeks  Weekly visits from 36 weeks until delivery    Common Medications Safe in Pregnancy  Acne:      Constipation:  Benzoyl Peroxide     Colace  Clindamycin      Dulcolax Suppository  Topica Erythromycin     Fibercon  Salicylic Acid      Metamucil         Miralax AVOID:        Senakot   Accutane    Cough:  Retin-A       Cough Drops  Tetracycline      Phenergan w/ Codeine if Rx  Minocycline      Robitussin (Plain & DM)  Antibiotics:     Crabs/Lice:  Ceclor       RID  Cephalosporins    AVOID:  E-Mycins      Kwell  Keflex  Macrobid/Macrodantin   Diarrhea:  Penicillin      Kao-Pectate  Zithromax      Imodium AD         PUSH FLUIDS AVOID:       Cipro     Fever:  Tetracycline      Tylenol (Regular or Extra  Minocycline       Strength)  Levaquin      Extra Strength-Do not          Exceed 8 tabs/24 hrs Caffeine:        <298m/day (equiv. To 1 cup of coffee or  approx. 3 12 oz sodas)         Gas: Cold/Hayfever:       Gas-X  Benadryl      Mylicon  Claritin       Phazyme  **Claritin-D        Chlor-Trimeton    Headaches:  Dimetapp      ASA-Free Excedrin  Drixoral-Non-Drowsy     Cold Compress  Mucinex (Guaifenasin)     Tylenol (Regular or Extra  Sudafed/Sudafed-12 Hour     Strength)  **Sudafed PE Pseudoephedrine   Tylenol Cold & Sinus     Vicks Vapor Rub  Zyrtec  **AVOID if Problems With Blood  Pressure         Heartburn: Avoid lying down for at least 1 hour after meals  Aciphex      Maalox     Rash:  Milk of Magnesia     Benadryl    Mylanta       1% Hydrocortisone Cream  Pepcid  Pepcid Complete   Sleep Aids:  Prevacid      Ambien   Prilosec       Benadryl  Rolaids       Chamomile Tea  Tums (Limit 4/day)     Unisom         Tylenol PM         Warm milk-add vanilla or  Hemorrhoids:       Sugar for taste  Anusol/Anusol H.C.  (RX:  Analapram 2.5%)  Sugar Substitutes:  Hydrocortisone OTC     Ok in moderation  Preparation H      Tucks        Vaseline lotion applied to tissue with wiping    Herpes:     Throat:  Acyclovir      Oragel  Famvir  Valtrex     Vaccines:         Flu Shot Leg Cramps:       *Gardasil  Benadryl      Hepatitis A         Hepatitis B Nasal Spray:       Pneumovax  Saline Nasal Spray     Polio Booster         Tetanus Nausea:       Tuberculosis test or PPD  Vitamin B6 25 mg TID   AVOID:    Dramamine      *Gardasil  Emetrol       Live Poliovirus  Ginger Root 250 mg QID    MMR (measles, mumps &  High Complex Carbs @ Bedtime    rebella)  Sea Bands-Accupressure    Varicella (Chickenpox)  Unisom 1/2 tab TID     *No known complications           If received before Pain:         Known pregnancy;   Darvocet       Resume series after  Lortab        Delivery  Percocet    Yeast:   Tramadol      Femstat  Tylenol 3      Gyne-lotrimin  Ultram       Monistat  Vicodin           MISC:         All Sunscreens           Hair Coloring/highlights          Insect Repellant's          (Including DEET)         Mystic Tans   Second Trimester of Pregnancy  The second trimester of pregnancy is from week 13 through week 27. This is also called months 4 through 6 of pregnancy. This is often the time when you feel your best. During the second trimester:  Morning sickness is less or has stopped.  You may have more energy.  You may feel hungry more  often. At this time, your unborn baby (fetus) is growing very fast. At the end of the sixth month, the unborn baby may be up to 12 inches long and weigh about 1 pounds. You will likely start to feel the baby move between 16 and 20 weeks of pregnancy. Body changes during your second trimester Your body continues to go through many changes during this time. The changes vary and generally return to normal after the baby is born. Physical changes  You will gain more weight.  You may start to get stretch marks on your hips, belly (abdomen), and breasts.  Your breasts will grow and may hurt.  Dark spots or blotches may develop on your face.  A dark line from your belly button to the pubic area (linea nigra) may appear.  You may have changes in your hair. Health changes  You may have headaches.  You may have heartburn.  You may have trouble pooping (constipation).  You may have hemorrhoids or swollen, bulging veins (varicose veins).  Your   gums may bleed.  You may pee (urinate) more often.  You may have back pain. Follow these instructions at home: Medicines  Take over-the-counter and prescription medicines only as told by your doctor. Some medicines are not safe during pregnancy.  Take a prenatal vitamin that contains at least 600 micrograms (mcg) of folic acid. Eating and drinking  Eat healthy meals that include: ? Fresh fruits and vegetables. ? Whole grains. ? Good sources of protein, such as meat, eggs, or tofu. ? Low-fat dairy products.  Avoid raw meat and unpasteurized juice, milk, and cheese.  You may need to take these actions to prevent or treat trouble pooping: ? Drink enough fluids to keep your pee (urine) pale yellow. ? Eat foods that are high in fiber. These include beans, whole grains, and fresh fruits and vegetables. ? Limit foods that are high in fat and sugar. These include fried or sweet foods. Activity  Exercise only as told by your doctor. Most  people can do their usual exercise during pregnancy. Try to exercise for 30 minutes at least 5 days a week.  Stop exercising if you have pain or cramps in your belly or lower back.  Do not exercise if it is too hot or too humid, or if you are in a place of great height (high altitude).  Avoid heavy lifting.  If you choose to, you may have sex unless your doctor tells you not to. Relieving pain and discomfort  Wear a good support bra if your breasts are sore.  Take warm water baths (sitz baths) to soothe pain or discomfort caused by hemorrhoids. Use hemorrhoid cream if your doctor approves.  Rest with your legs raised (elevated) if you have leg cramps or low back pain.  If you develop bulging veins in your legs: ? Wear support hose as told by your doctor. ? Raise your feet for 15 minutes, 3-4 times a day. ? Limit salt in your food. Safety  Wear your seat belt at all times when you are in a car.  Talk with your doctor if someone is hurting you or yelling at you a lot. Lifestyle  Do not use hot tubs, steam rooms, or saunas.  Do not douche. Do not use tampons or scented sanitary pads.  Avoid cat litter boxes and soil used by cats. These carry germs that can harm your baby and can cause a loss of your baby by miscarriage or stillbirth.  Do not use herbal medicines, illegal drugs, or medicines that are not approved by your doctor. Do not drink alcohol.  Do not smoke or use any products that contain nicotine or tobacco. If you need help quitting, ask your doctor. General instructions  Keep all follow-up visits. This is important.  Ask your doctor about local prenatal classes.  Ask your doctor about the right foods to eat or for help finding a counselor. Where to find more information  American Pregnancy Association: americanpregnancy.org  American College of Obstetricians and Gynecologists: www.acog.org  Office on Women's Health: womenshealth.gov/pregnancy Contact a doctor  if:  You have a headache that does not go away when you take medicine.  You have changes in how you see, or you see spots in front of your eyes.  You have mild cramps, pressure, or pain in your lower belly.  You continue to feel like you may vomit (nauseous), you vomit, or you have watery poop (diarrhea).  You have bad-smelling fluid coming from your vagina.  You have pain when you   pee or your pee smells bad.  You have very bad swelling of your face, hands, ankles, feet, or legs.  You have a fever. Get help right away if:  You are leaking fluid from your vagina.  You have spotting or bleeding from your vagina.  You have very bad belly cramping or pain.  You have trouble breathing.  You have chest pain.  You faint.  You have not felt your baby move for the time period told by your doctor.  You have new or increased pain, swelling, or redness in an arm or leg. Summary  The second trimester of pregnancy is from week 13 through week 27 (months 4 through 6).  Eat healthy meals.  Exercise as told by your doctor. Most people can do their usual exercise during pregnancy.  Do not use herbal medicines, illegal drugs, or medicines that are not approved by your doctor. Do not drink alcohol.  Call your doctor if you get sick or if you notice anything unusual about your pregnancy. This information is not intended to replace advice given to you by your health care provider. Make sure you discuss any questions you have with your health care provider. Document Revised: 03/26/2020 Document Reviewed: 01/31/2020 Elsevier Patient Education  2021 Elsevier Inc.  

## 2021-02-23 NOTE — Progress Notes (Signed)
NOB: doing well no concerns.

## 2021-02-23 NOTE — Progress Notes (Signed)
Leticia Clas presents for NOB nurse interview visit. Pregnancy confirmation done 11/24/2020. G2. P1001. Pregnancy education material explained and given. 0 cats in home. NOB labs ordered.  HIV labs and drug screen were explained and ordered. PNV encouraged. Genetic screening options discussed. Genetic testing: Ordered. Patient may discuss with the provider. Patient to follow up with provider NOB physical. All questions answered.

## 2021-02-23 NOTE — Progress Notes (Signed)
NEW OB HISTORY AND PHYSICAL  SUBJECTIVE:       Kathleen Glass is a 25 y.o. [id number] female, Patient's last menstrual period was 09/28/2020 (within weeks)., Estimated Date of Delivery: 07/14/21, [redacted]w[redacted]d, presents today for establishment of Prenatal Care.  Notes resolving breast tenderness and occasional breast leakage.   Reports missing appointments due to new job without flexibility, no longer working.   Denies difficulty breathing or respiratory distress, chest pain, abdominal pain, vaginal bleeding, and leg pain or swelling.   Desires genetic screening.    Gynecologic History  Patient's last menstrual period was 09/28/2020 (within weeks).   Contraception: none   Last Pap: due.   Obstetric History  OB History  Gravida Para Term Preterm AB Living  2 1 1     1   SAB IAB Ectopic Multiple Live Births        0 1    # Outcome Date GA Lbr Len/2nd Weight Sex Delivery Anes PTL Lv  2 Current           1 Term 10/07/17 [redacted]w[redacted]d 18:47 / 01:49 6 lb 15.5 oz (3.16 kg) F Vag-Spont EPI  LIV    Obstetric Comments  1st Menstrual Cycle: 12    Past Medical History:  Diagnosis Date  . Urinary tract infection     Past Surgical History:  Procedure Laterality Date  . BREAST SURGERY Left 06-12-13   benign fibroadenoma    Current Outpatient Medications on File Prior to Visit  Medication Sig Dispense Refill  . Prenatal Vit-Fe Fumarate-FA (PRENATAL VITAMINS PO) Take by mouth.    . busPIRone (BUSPAR) 10 MG tablet Take 1 tablet (10 mg total) by mouth 3 (three) times daily. (Patient not taking: No sig reported) 90 tablet 1  . sertraline (ZOLOFT) 50 MG tablet Take 1 tablet (50 mg total) by mouth daily for 7 days, THEN 2 tablets (100 mg total) daily for 21 days. (Patient not taking: Reported on 11/24/2020) 45 tablet 1   No current facility-administered medications on file prior to visit.    No Known Allergies  Social History   Socioeconomic History  . Marital status: Single    Spouse name: Not on  file  . Number of children: Not on file  . Years of education: Not on file  . Highest education level: Not on file  Occupational History  . Not on file  Tobacco Use  . Smoking status: Former Smoker    Packs/day: 0.50    Types: Cigarettes  . Smokeless tobacco: Never Used  Vaping Use  . Vaping Use: Never used  Substance and Sexual Activity  . Alcohol use: No  . Drug use: No  . Sexual activity: Yes  Other Topics Concern  . Not on file  Social History Narrative  . Not on file   Social Determinants of Health   Financial Resource Strain: Not on file  Food Insecurity: Not on file  Transportation Needs: Not on file  Physical Activity: Not on file  Stress: Not on file  Social Connections: Not on file  Intimate Partner Violence: Not on file    Family History  Problem Relation Age of Onset  . Breast cancer Other   . Breast cancer Paternal Aunt        great aunt  . Diabetes Mother   . Diabetes Maternal Grandfather   . Bipolar disorder Father   . Diabetes Maternal Grandmother   . Ovarian cancer Neg Hx   . Colon cancer Neg Hx  The following portions of the patient's history were reviewed and updated as appropriate: allergies, current medications, past OB history, past medical history, past surgical history, past family history, past social history, and problem list.  Review of Systems:  ROS negative except as noted above. Information obtained from patient.   OBJECTIVE:  BP 125/81   Pulse 77   Wt 130 lb 14.4 oz (59.4 kg)   LMP 09/28/2020 (Within Weeks)   BMI 23.94 kg/m   Initial Physical Exam (New OB)  GENERAL APPEARANCE: alert, well appearing, in no apparent distress  HEAD: normocephalic, atraumatic  MOUTH: deferred, due to COVID-19 pandemic   THYROID: no thyromegaly or masses present  BREASTS: no masses noted, no significant tenderness, no palpable axillary nodes, no skin changes, lactating, bilateral nipple rings present  LUNGS: clear to auscultation,  no wheezes, rales or rhonchi, symmetric air entry  HEART: regular rate and rhythm, no murmurs  ABDOMEN: soft, nontender, nondistended, no abnormal masses, no epigastric pain, fundus soft, nontender 20 cm and FHT present  EXTREMITIES: no redness or tenderness in the calves or thighs, no edema  SKIN: normal coloration and turgor, no rashes; professional tattoos present  LYMPH NODES: no adenopathy palpable  NEUROLOGIC: alert, oriented, normal speech, no focal findings or movement disorder noted  PELVIC EXAM EXTERNAL GENITALIA: normal appearing vulva with no masses, tenderness or lesions VAGINA: no abnormal discharge or lesions CERVIX: no lesions or cervical motion tenderness and Pap collected  ASSESSMENT:  Normal pregnancy, [redacted] weeks gestation  Late prenatal care  Screening cervical cancer  History UTI, urine dip positive nitrates  Genetic screening  Screening anemia  Screening veneral disease  PLAN: Prenatal care Rx Macrobid New OB counseling: The patient has been given an overview regarding routine prenatal care. Recommendations regarding diet, weight gain, and exercise in pregnancy were given. Prenatal testing, optional genetic testing, and ultrasound use in pregnancy were reviewed.  Benefits of Breast Feeding were discussed. The patient is encouraged to consider nursing her baby post partum. See orders

## 2021-02-24 ENCOUNTER — Telehealth: Payer: Self-pay | Admitting: Certified Nurse Midwife

## 2021-02-24 LAB — CBC WITH DIFFERENTIAL/PLATELET
Basophils Absolute: 0 10*3/uL (ref 0.0–0.2)
Basos: 0 %
EOS (ABSOLUTE): 0.1 10*3/uL (ref 0.0–0.4)
Eos: 1 %
Hematocrit: 32.4 % — ABNORMAL LOW (ref 34.0–46.6)
Hemoglobin: 10.7 g/dL — ABNORMAL LOW (ref 11.1–15.9)
Immature Grans (Abs): 0 10*3/uL (ref 0.0–0.1)
Immature Granulocytes: 1 %
Lymphocytes Absolute: 2.2 10*3/uL (ref 0.7–3.1)
Lymphs: 26 %
MCH: 27.4 pg (ref 26.6–33.0)
MCHC: 33 g/dL (ref 31.5–35.7)
MCV: 83 fL (ref 79–97)
Monocytes Absolute: 0.6 10*3/uL (ref 0.1–0.9)
Monocytes: 7 %
Neutrophils Absolute: 5.6 10*3/uL (ref 1.4–7.0)
Neutrophils: 65 %
Platelets: 306 10*3/uL (ref 150–450)
RBC: 3.9 x10E6/uL (ref 3.77–5.28)
RDW: 13.1 % (ref 11.7–15.4)
WBC: 8.6 10*3/uL (ref 3.4–10.8)

## 2021-02-24 LAB — ABO AND RH: Rh Factor: POSITIVE

## 2021-02-24 LAB — RUBELLA SCREEN: Rubella Antibodies, IGG: 1.35 index (ref >0.99)

## 2021-02-24 LAB — RPR: RPR Ser Ql: NONREACTIVE

## 2021-02-24 LAB — ANTIBODY SCREEN: Antibody Screen: NEGATIVE

## 2021-02-24 LAB — HCV INTERPRETATION

## 2021-02-24 LAB — URINALYSIS, ROUTINE W REFLEX MICROSCOPIC

## 2021-02-24 LAB — VARICELLA ZOSTER ANTIBODY, IGG: Varicella zoster IgG: 359 index (ref >165)

## 2021-02-24 LAB — HIV ANTIBODY (ROUTINE TESTING W REFLEX): HIV Screen 4th Generation wRfx: NONREACTIVE

## 2021-02-24 LAB — VIRAL HEPATITIS HBV, HCV
HCV Ab: <0.1 s/co ratio (ref 0.0–0.9)
Hep B Core Total Ab: NEGATIVE
Hep B Surface Ab, Qual: NONREACTIVE
Hepatitis B Surface Ag: NEGATIVE

## 2021-02-24 LAB — HGB SOLU + RFLX FRAC: Sickle Solubility Test - HGBRFX: NEGATIVE

## 2021-02-24 NOTE — Telephone Encounter (Signed)
Patient called in and states she would like to know the gender results.  Patient states she received notifications her labs were ready but when she looked at the labs, she couldn't find it.  Please advise.

## 2021-02-25 LAB — CYTOLOGY - PAP
Chlamydia: NEGATIVE
Comment: NEGATIVE
Comment: NORMAL
Diagnosis: NEGATIVE
Neisseria Gonorrhea: NEGATIVE

## 2021-02-26 LAB — URINE CULTURE, OB REFLEX

## 2021-02-26 LAB — CULTURE, OB URINE

## 2021-02-26 NOTE — Telephone Encounter (Signed)
Pt aware it could take up to 15 business days for the results to come back. Pt voiced understanding.

## 2021-03-11 LAB — NICOTINE SCREEN, URINE: Cotinine Ql Scrn, Ur: POSITIVE ng/mL — AB

## 2021-03-11 LAB — MONITOR DRUG PROFILE 14(MW)
Amphetamine Scrn, Ur: NEGATIVE ng/mL
BARBITURATE SCREEN URINE: NEGATIVE ng/mL
BENZODIAZEPINE SCREEN, URINE: NEGATIVE ng/mL
Buprenorphine, Urine: NEGATIVE ng/mL
Cocaine (Metab) Scrn, Ur: NEGATIVE ng/mL
Creatinine(Crt), U: 145.1 mg/dL (ref 20.0–300.0)
Fentanyl, Urine: NEGATIVE pg/mL
Meperidine Screen, Urine: NEGATIVE ng/mL
Methadone Screen, Urine: NEGATIVE ng/mL
OXYCODONE+OXYMORPHONE UR QL SCN: NEGATIVE ng/mL
Opiate Scrn, Ur: NEGATIVE ng/mL
Ph of Urine: 7.5 (ref 4.5–8.9)
Phencyclidine Qn, Ur: NEGATIVE ng/mL
Propoxyphene Scrn, Ur: NEGATIVE ng/mL
SPECIFIC GRAVITY: 1.021
Tramadol Screen, Urine: NEGATIVE ng/mL

## 2021-03-11 LAB — CANNABINOID (GC/MS), URINE
Cannabinoid: POSITIVE — AB
Carboxy THC (GC/MS): 18 ng/mL

## 2021-03-12 ENCOUNTER — Encounter: Payer: Self-pay | Admitting: Certified Nurse Midwife

## 2021-03-12 DIAGNOSIS — Z87898 Personal history of other specified conditions: Secondary | ICD-10-CM | POA: Insufficient documentation

## 2021-03-12 DIAGNOSIS — F1291 Cannabis use, unspecified, in remission: Secondary | ICD-10-CM | POA: Insufficient documentation

## 2021-03-18 ENCOUNTER — Ambulatory Visit
Admission: RE | Admit: 2021-03-18 | Discharge: 2021-03-18 | Disposition: A | Discharge status: Home / Self Care | Payer: Medicaid Other | Source: Ambulatory Visit | Attending: Certified Nurse Midwife | Admitting: Certified Nurse Midwife

## 2021-03-18 ENCOUNTER — Other Ambulatory Visit: Payer: Self-pay

## 2021-03-18 DIAGNOSIS — Z3A19 19 weeks gestation of pregnancy: Secondary | ICD-10-CM | POA: Diagnosis present

## 2021-03-18 DIAGNOSIS — Z3482 Encounter for supervision of other normal pregnancy, second trimester: Secondary | ICD-10-CM

## 2021-03-23 ENCOUNTER — Encounter: Payer: Medicaid Other | Admitting: Certified Nurse Midwife

## 2021-03-26 ENCOUNTER — Encounter: Payer: Medicaid Other | Admitting: Certified Nurse Midwife

## 2021-03-26 DIAGNOSIS — Z3A24 24 weeks gestation of pregnancy: Secondary | ICD-10-CM

## 2021-03-26 DIAGNOSIS — Z3402 Encounter for supervision of normal first pregnancy, second trimester: Secondary | ICD-10-CM

## 2021-04-02 ENCOUNTER — Encounter: Payer: Self-pay | Admitting: Certified Nurse Midwife

## 2021-04-07 ENCOUNTER — Encounter: Payer: Medicaid Other | Admitting: Certified Nurse Midwife

## 2021-04-20 ENCOUNTER — Other Ambulatory Visit: Payer: Self-pay

## 2021-04-20 ENCOUNTER — Ambulatory Visit (INDEPENDENT_AMBULATORY_CARE_PROVIDER_SITE_OTHER): Payer: Medicaid Other | Admitting: Certified Nurse Midwife

## 2021-04-20 ENCOUNTER — Encounter: Payer: Self-pay | Admitting: Certified Nurse Midwife

## 2021-04-20 VITALS — BP 125/83 | HR 109 | Wt 139.7 lb

## 2021-04-20 DIAGNOSIS — Z23 Encounter for immunization: Secondary | ICD-10-CM | POA: Diagnosis not present

## 2021-04-20 DIAGNOSIS — Z3A27 27 weeks gestation of pregnancy: Secondary | ICD-10-CM

## 2021-04-20 DIAGNOSIS — Z3482 Encounter for supervision of other normal pregnancy, second trimester: Secondary | ICD-10-CM

## 2021-04-20 LAB — POCT URINALYSIS DIPSTICK OB
Spec Grav, UA: 1.01 (ref 1.010–1.025)
pH, UA: 7.5 (ref 5.0–8.0)

## 2021-04-20 NOTE — Progress Notes (Signed)
ROB: She has concerns about the amount of pressure she is having. She has had some Braxton Hick's. She also has concerns about swelling and the amount of fluid she retains.

## 2021-04-20 NOTE — Progress Notes (Signed)
ROB-Notes family has fully recovered from stomach bug. Questions swelling and BHCs while working. Discussed home treatment measures including adequate hydration, use of abdominal support and compression stockings and use of hydrotherapy; verbalized understanding. TDaP given, see chart. Blood transfusion completed. Reviewed red flag symptoms and when to call. RTC x 2 week for 28 week labs and ROB or sooner if needed.

## 2021-04-20 NOTE — Patient Instructions (Signed)
Fetal Movement Counts Patient Name: ________________________________________________ Patient DueDate: ____________________ What is a fetal movement count?  A fetal movement count is the number of times that you feel your baby move during a certain amount of time. This may also be called a fetal kick count. A fetal movement count is recommended for every pregnant woman. You may be askedto start counting fetal movements as early as week 28 of your pregnancy. Pay attention to when your baby is most active. You may notice your baby's sleep and wake cycles. You may also notice things that make your baby move more. You should do a fetal movement count: When your baby is normally most active. At the same time each day. A good time to count movements is while you are resting, after having somethingto eat and drink. How do I count fetal movements? Find a quiet, comfortable area. Sit, or lie down on your side. Write down the date, the start time and stop time, and the number of movements that you felt between those two times. Take this information with you to your health care visits. Write down your start time when you feel the first movement. Count kicks, flutters, swishes, rolls, and jabs. You should feel at least 10 movements. You may stop counting after you have felt 10 movements, or if you have been counting for 2 hours. Write down the stop time. If you do not feel 10 movements in 2 hours, contact your health care provider for further instructions. Your health care provider may want to do additional tests to assess your baby's well-being. Contact a health care provider if: You feel fewer than 10 movements in 2 hours. Your baby is not moving like he or she usually does. Date: ____________ Start time: ____________ Stop time: ____________ Movements:____________ Date: ____________ Start time: ____________ Stop time: ____________ Movements:____________ Date: ____________ Start time: ____________ Stop  time: ____________ Movements:____________ Date: ____________ Start time: ____________ Stop time: ____________ Movements:____________ Date: ____________ Start time: ____________ Stop time: ____________ Movements:____________ Date: ____________ Start time: ____________ Stop time: ____________ Movements:____________ Date: ____________ Start time: ____________ Stop time: ____________ Movements:____________ Date: ____________ Start time: ____________ Stop time: ____________ Movements:____________ Date: ____________ Start time: ____________ Stop time: ____________ Movements:____________ This information is not intended to replace advice given to you by your health care provider. Make sure you discuss any questions you have with your healthcare provider. Document Revised: 06/07/2019 Document Reviewed: 06/07/2019 Elsevier Patient Education  [employer].

## 2021-05-05 ENCOUNTER — Other Ambulatory Visit: Payer: Medicaid Other

## 2021-05-07 ENCOUNTER — Ambulatory Visit (INDEPENDENT_AMBULATORY_CARE_PROVIDER_SITE_OTHER): Payer: Medicaid Other | Admitting: Certified Nurse Midwife

## 2021-05-07 ENCOUNTER — Other Ambulatory Visit: Payer: Self-pay

## 2021-05-07 ENCOUNTER — Encounter: Payer: Self-pay | Admitting: Certified Nurse Midwife

## 2021-05-07 ENCOUNTER — Other Ambulatory Visit: Payer: Medicaid Other

## 2021-05-07 VITALS — BP 97/62 | HR 86 | Wt 145.1 lb

## 2021-05-07 DIAGNOSIS — Z3A30 30 weeks gestation of pregnancy: Secondary | ICD-10-CM

## 2021-05-07 DIAGNOSIS — Z113 Encounter for screening for infections with a predominantly sexual mode of transmission: Secondary | ICD-10-CM

## 2021-05-07 DIAGNOSIS — Z130 Encounter for screening for diseases of the blood and blood-forming organs and certain disorders involving the immune mechanism: Secondary | ICD-10-CM

## 2021-05-07 DIAGNOSIS — Z3483 Encounter for supervision of other normal pregnancy, third trimester: Secondary | ICD-10-CM

## 2021-05-07 DIAGNOSIS — Z131 Encounter for screening for diabetes mellitus: Secondary | ICD-10-CM

## 2021-05-07 LAB — POCT URINALYSIS DIPSTICK OB
Blood, UA: NEGATIVE
Glucose, UA: NEGATIVE
Leukocytes, UA: NEGATIVE
POC,PROTEIN,UA: NEGATIVE
Spec Grav, UA: 1.015 (ref 1.010–1.025)
pH, UA: 7 (ref 5.0–8.0)

## 2021-05-07 NOTE — Patient Instructions (Signed)
Postpartum Tubal Ligation Postpartum tubal ligation (PPTL) is a procedure to close the fallopian tubes. This is done to prevent pregnancy. When the fallopian tubes are closed, the eggs that the ovaries release cannot enter the uterus, and sperm cannot reach the eggs. PPTL is done right after childbirth or 1-2 days after childbirth, before the uterus returns to its normal position. If you have a cesarean section, it can be performed at the same time as the procedure. Having thisdone after childbirth does not make your stay in the hospital longer. PPTL is sometimes called "getting your tubes tied." You should not have this procedure if you want to get pregnant again or if you are unsure about havingmore children. Tell a health care provider about: Any allergies you have. All medicines you are taking, including vitamins, herbs, eye drops, creams, and over-the-counter medicines. Any problems you or family members have had with anesthetic medicines. Any blood disorders you have. Any surgeries you have had. Any medical conditions you have or have had. Any past pregnancies. What are the risks? Generally, this is a safe procedure. However, problems may occur, including: Infection. Bleeding. Damage to other organs in the abdomen. Side effects from anesthetic medicines. Failure of the procedure. If this happens, you could get pregnant. Ectopic pregnancy. This is a pregnancy in which the egg attaches outside the uterus. What happens before the procedure? Ask your health care provider about: How much pain you can expect to have. What medicines you will be given for pain, especially if you are breastfeeding. What happens during the procedure? If you had a vaginal delivery: An IV will be inserted into one of your veins. You will be given one or more of the following: A medicine to help you relax (sedative). A medicine to numb the area (local anesthetic). A medicine to make you fall asleep (general  anesthetic). A medicine that is injected into an area of your body to numb everything below the injection site (regional anesthetic). If you have been given a general anesthetic, a tube will be put down your throat to help you breathe. Your bladder may be emptied with a small tube (catheter). An incision will be made just below your belly button. Your fallopian tubes will be located and brought up through the incision. Your fallopian tubes will be tied off, burned (cauterized), or blocked with a clip, ring, or clamp. A small part in the center of each fallopian tube may be removed. The incision will be closed with stitches (sutures). A bandage (dressing) will be placed over the incision. If you had a cesarean delivery: Tubal ligation will be done through the incision that was used for the cesarean delivery of your baby. The incision will be closed with sutures. A dressing will be placed over the incision. The procedure may vary among health care providers and hospitals. What happens after the procedure? Your blood pressure, heart rate, breathing rate, and blood oxygen level will be monitored until you leave the hospital. You will be given pain medicine as needed. You will be encouraged to get up early and walk to prevent blood clots. If you were given a sedative during the procedure, it can affect you for several hours. Do not drive or operate machinery until your health care provider says that it is safe. Summary Postpartum tubal ligation is a procedure that closes the fallopian tubes to prevent pregnancy. This procedure is done while you are still in the hospital after childbirth. If you have a cesarean section, it  can be performed at the same time. Having this done after childbirth does not make your stay in the hospital longer. Postpartum tubal ligation is considered permanent. You should not have this procedure if you want to get pregnant again or if you are unsure about having more  children. Talk to your health care provider to see if this procedure is right for you. This information is not intended to replace advice given to you by your health care provider. Make sure you discuss any questions you have with your healthcare provider. Document Revised: 07/04/2020 Document Reviewed: 07/04/2020 Elsevier Patient Education  2022 [employer].

## 2021-05-07 NOTE — Progress Notes (Signed)
ROB: She has been having Braxton Hick's. She rates the pain an 8/10, pain comes and goes, and is worse in the afternoon. She feel really sick to her stomach today and she gets dizzy when she stands up.

## 2021-05-08 LAB — CBC
Hematocrit: 32.8 % — ABNORMAL LOW (ref 34.0–46.6)
Hemoglobin: 10.7 g/dL — ABNORMAL LOW (ref 11.1–15.9)
MCH: 27 pg (ref 26.6–33.0)
MCHC: 32.6 g/dL (ref 31.5–35.7)
MCV: 83 fL (ref 79–97)
Platelets: 305 10*3/uL (ref 150–450)
RBC: 3.97 x10E6/uL (ref 3.77–5.28)
RDW: 11.9 % (ref 11.7–15.4)
WBC: 11.8 10*3/uL — ABNORMAL HIGH (ref 3.4–10.8)

## 2021-05-08 LAB — GLUCOSE, 1 HOUR GESTATIONAL: Gestational Diabetes Screen: 90 mg/dL (ref 65–139)

## 2021-05-08 LAB — RPR: RPR Ser Ql: NONREACTIVE

## 2021-05-08 NOTE — Progress Notes (Signed)
ROB-Reports increased pelvic pressure and pain with while working; noticed creamy, thin, white discharge. Pregnancy precaution note given, see Communications. Speculum exam completed: cervix closed, thick, and long on inspection. Taking Macrobid daily as prescribed. 28 week labs today, see orders. Blood transfusion consent signed. TDaP given, see MAR. Breastfeeding education completed. Overview and Birth Plan discussed with patient, see chart. Anticipatory guidance regarding course of prenatal care. Reviewed red flag symptoms and when to call. RTC x 2 weeks for ROB with ANNIE or sooner if needed.

## 2021-06-08 ENCOUNTER — Telehealth: Payer: Self-pay | Admitting: Certified Nurse Midwife

## 2021-06-08 NOTE — Telephone Encounter (Signed)
Pt states that she thinks she lost her mucus plug.  Since she has noticed that she is having a lot of pressure and sharpe pains in her lower abdomen...please advise

## 2021-06-09 ENCOUNTER — Encounter: Payer: Self-pay | Admitting: Certified Nurse Midwife

## 2021-06-09 ENCOUNTER — Ambulatory Visit (INDEPENDENT_AMBULATORY_CARE_PROVIDER_SITE_OTHER): Payer: Medicaid Other | Admitting: Certified Nurse Midwife

## 2021-06-09 ENCOUNTER — Other Ambulatory Visit: Payer: Self-pay

## 2021-06-09 VITALS — BP 139/84 | HR 84 | Wt 152.6 lb

## 2021-06-09 DIAGNOSIS — Z3A35 35 weeks gestation of pregnancy: Secondary | ICD-10-CM

## 2021-06-09 DIAGNOSIS — Z3483 Encounter for supervision of other normal pregnancy, third trimester: Secondary | ICD-10-CM

## 2021-06-09 LAB — POCT URINALYSIS DIPSTICK OB
Bilirubin, UA: NEGATIVE
Blood, UA: NEGATIVE
Glucose, UA: NEGATIVE
Ketones, UA: NEGATIVE
Leukocytes, UA: NEGATIVE
Nitrite, UA: NEGATIVE
POC,PROTEIN,UA: NEGATIVE
Spec Grav, UA: 1.015 (ref 1.010–1.025)
Urobilinogen, UA: 0.2 E.U./dL
pH, UA: 7.5 (ref 5.0–8.0)

## 2021-06-09 NOTE — Progress Notes (Signed)
ROB doing well, feels good movement. Has lots of pressure. Discussed loss of elasticity of round ligaments given this is not her first pregnancy. Discussed use of belly band. She has some braxton hicks ctx around 3 hr. Reassurance given. Discussed avoidance of SVE until next week. She verbalizes and agrees. Discussed GBS testing next visit. She verbalizes and agrees ROB 1 wk.   Philip Aspen, CNM

## 2021-06-09 NOTE — Patient Instructions (Signed)

## 2021-06-12 ENCOUNTER — Encounter: Payer: Medicaid Other | Admitting: Certified Nurse Midwife

## 2021-06-19 ENCOUNTER — Ambulatory Visit (INDEPENDENT_AMBULATORY_CARE_PROVIDER_SITE_OTHER): Payer: Medicaid Other | Admitting: Certified Nurse Midwife

## 2021-06-19 ENCOUNTER — Other Ambulatory Visit: Payer: Self-pay

## 2021-06-19 VITALS — BP 128/80 | HR 86 | Wt 152.3 lb

## 2021-06-19 DIAGNOSIS — Z3483 Encounter for supervision of other normal pregnancy, third trimester: Secondary | ICD-10-CM

## 2021-06-19 DIAGNOSIS — Z3A36 36 weeks gestation of pregnancy: Secondary | ICD-10-CM

## 2021-06-19 DIAGNOSIS — Z113 Encounter for screening for infections with a predominantly sexual mode of transmission: Secondary | ICD-10-CM

## 2021-06-19 LAB — POCT URINALYSIS DIPSTICK OB
Bilirubin, UA: NEGATIVE
Blood, UA: NEGATIVE
Glucose, UA: NEGATIVE
Ketones, UA: NEGATIVE
Leukocytes, UA: NEGATIVE
Nitrite, UA: NEGATIVE
POC,PROTEIN,UA: NEGATIVE
Spec Grav, UA: 1.01 (ref 1.010–1.025)
Urobilinogen, UA: 0.2 E.U./dL
pH, UA: 7 (ref 5.0–8.0)

## 2021-06-19 NOTE — Progress Notes (Signed)
ROB doing well. Feeling good movement. GBS and culture today. Discussed MD's covering call while on vacation on my days off. She verbalizes understanding. Herbal prep hand out given. Discussed birth plan. Wants epidural. Plans to breast feed and is having BTL -consent form signe and scanned in chart in media 05/27/21. Follow up 1 wk for ROB.  Philip Aspen, CNM

## 2021-06-19 NOTE — Patient Instructions (Signed)
Rosen's Emergency Medicine: Concepts and Clinical Practice (9th ed., pp. 2296- 2312). Elsevier.">  Braxton Hicks Contractions  Contractions of the uterus can occur throughout pregnancy, but they are not always a sign that you are in labor. You may have practice contractions called Braxton Hicks contractions. These false labor contractions are sometimesconfused with true labor. What are [place] contractions? Braxton Hicks contractions are tightening movements that occur in the muscles of the uterus before labor. Unlike true labor contractions, these contractions do not result in opening (dilation) and thinning of the lowest part of the uterus (cervix). Toward the end of pregnancy (32-34 weeks), Braxton Hicks contractions can happen more often and may become stronger. These contractions are sometimesdifficult to tell apart from true labor because they can be very uncomfortable. How to tell the difference between true labor and false labor True labor Contractions last 30-70 seconds. Contractions become very regular. Discomfort is usually felt in the top of the uterus, and it spreads to the lower abdomen and low back. Contractions do not go away with walking. Contractions usually become stronger and more frequent. The cervix dilates and gets thinner. False labor Contractions are usually shorter, weaker, and farther apart than true labor contractions. Contractions are usually irregular. Contractions are often felt in the front of the lower abdomen and in the groin. Contractions may go away when you walk around or change positions while lying down. The cervix usually does not dilate or become thin. Sometimes, the only way to tell if you are in true labor is for your healthcare provider to look for changes in your cervix. Your health care provider will do a physical exam and may monitor your contractions. If you are in true labor, your health care provider will send youhome with instructions  about when to return to the hospital. You may continue to have Braxton Hicks contractions until you go into truelabor. Follow these instructions at home:  Take over-the-counter and prescription medicines only as told by your health care provider. If Braxton Hicks contractions are making you uncomfortable: Change your position from lying down or resting to walking, or change from walking to resting. Sit and rest in a tub of warm water. Drink enough fluid to keep your urine pale yellow. Dehydration may cause these contractions. Do slow and deep breathing several times an hour. Keep all follow-up visits. This is important. Contact a health care provider if: You have a fever. You have continuous pain in your abdomen. Your contractions become stronger, more regular, and closer together. You pass blood-tinged mucus. Get help right away if: You have fluid leaking or gushing from your vagina. You have bright red blood coming from your vagina. Your baby is not moving inside you as much as it used to. Summary You may have practice contractions called Braxton Hicks contractions. These false labor contractions are sometimes confused with true labor. Braxton Hicks contractions are usually shorter, weaker, farther apart, and less regular than true labor contractions. True labor contractions usually become stronger, more regular, and more frequent. Manage discomfort from [facility] contractions by changing position, resting in a warm bath, practicing deep breathing, and drinking plenty of water. Keep all follow-up visits. Contact your health care provider if your contractions become stronger, more regular, and closer together. This information is not intended to replace advice given to you by your health care provider. Make sure you discuss any questions you have with your healthcare provider. Document Revised: 08/25/2020 Document Reviewed: 08/25/2020 Elsevier Patient Education  2022 Elsevier  Inc.  

## 2021-06-21 LAB — STREP GP B NAA: Strep Gp B NAA: NEGATIVE

## 2021-06-24 LAB — GC/CHLAMYDIA PROBE AMP
Chlamydia trachomatis, NAA: NEGATIVE
Neisseria Gonorrhoeae by PCR: NEGATIVE

## 2021-06-24 IMAGING — US US OB COMP +14 WK
1 series · 14 of 28 positions shown · non-contrast
Comparison: none

CLINICAL DATA: Pregnancy.  Fetal anatomy evaluation.

EXAM:
OBSTETRICAL ULTRASOUND >14 WKS

[Series 1: us ob comp +14 wk · 0.23mm/px · 14 of 78 slices shown]
[im 3/78]
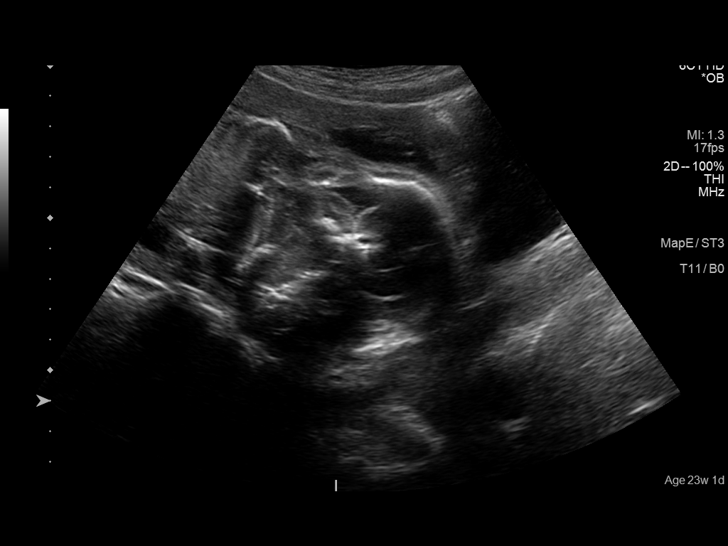
[im 9/78]
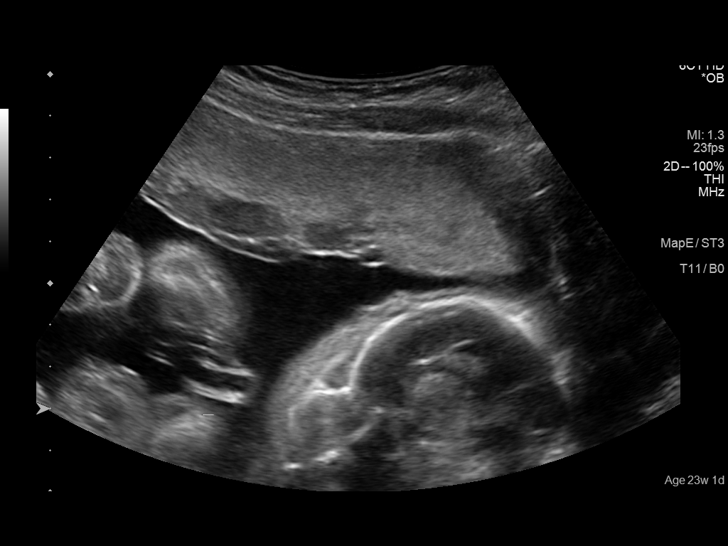
[im 15/78]
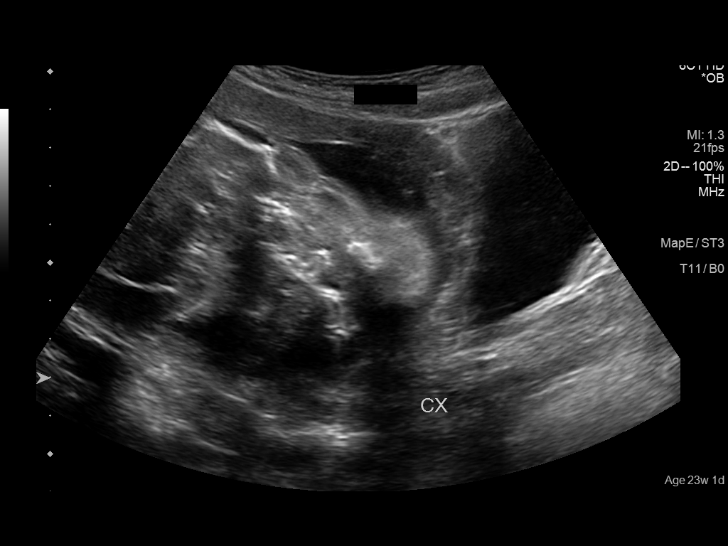
[im 20/78]
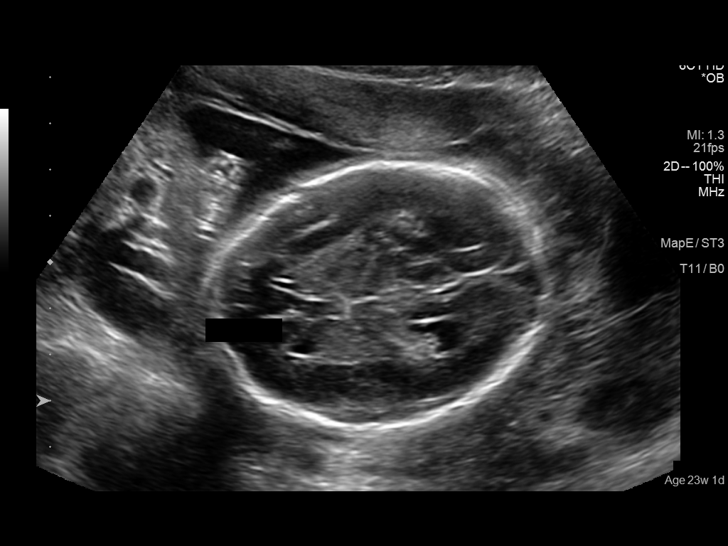
[im 26/78]
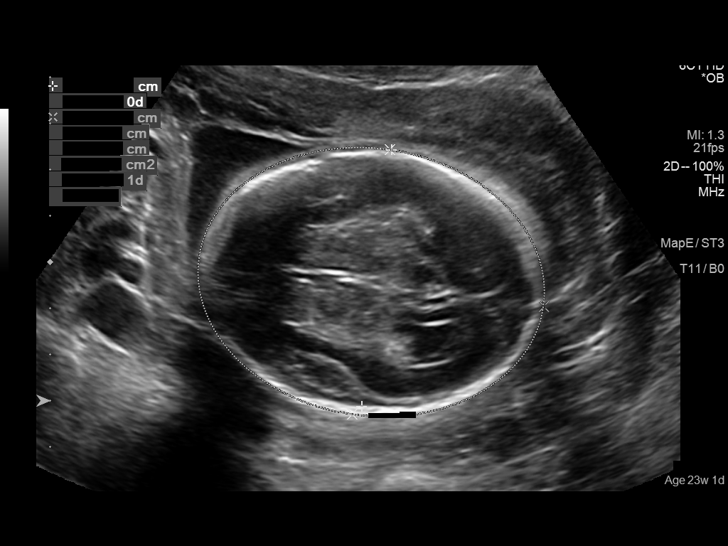
[im 32/78]
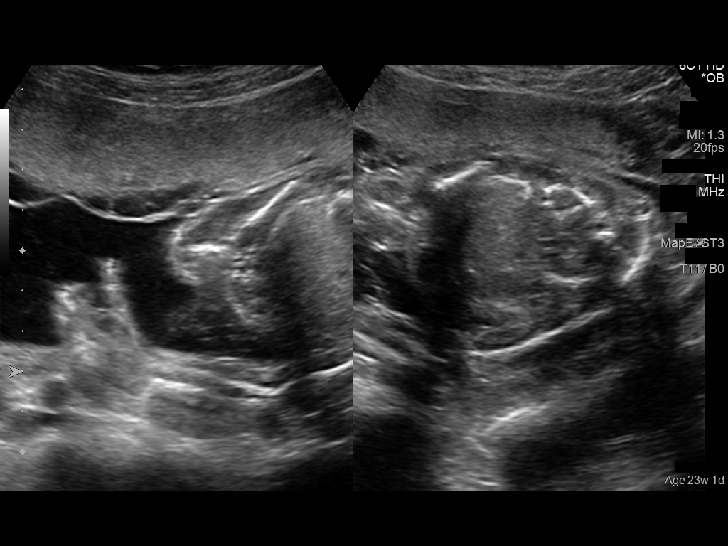
[im 38/78]
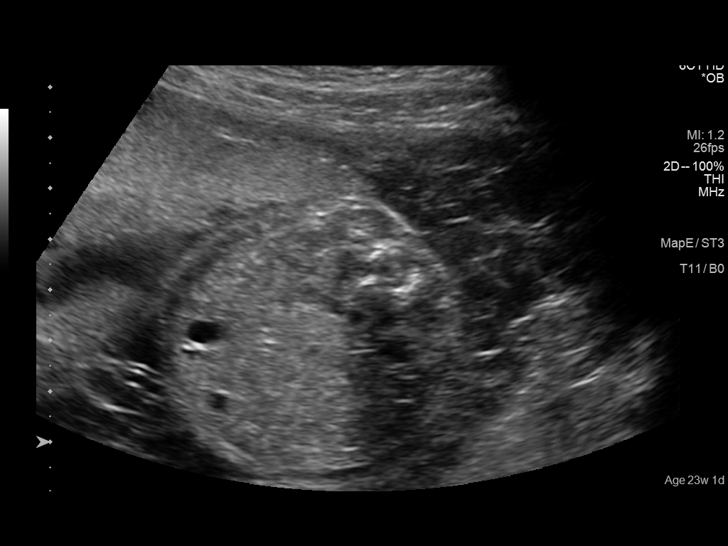
[im 43/78]
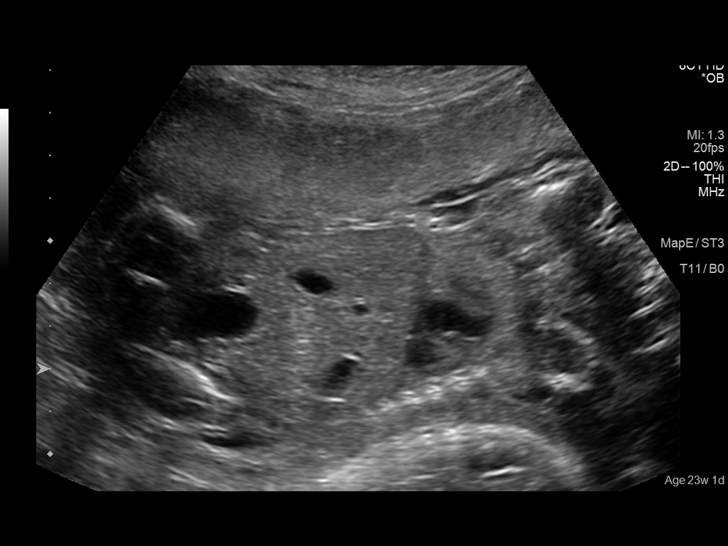
[im 49/78]
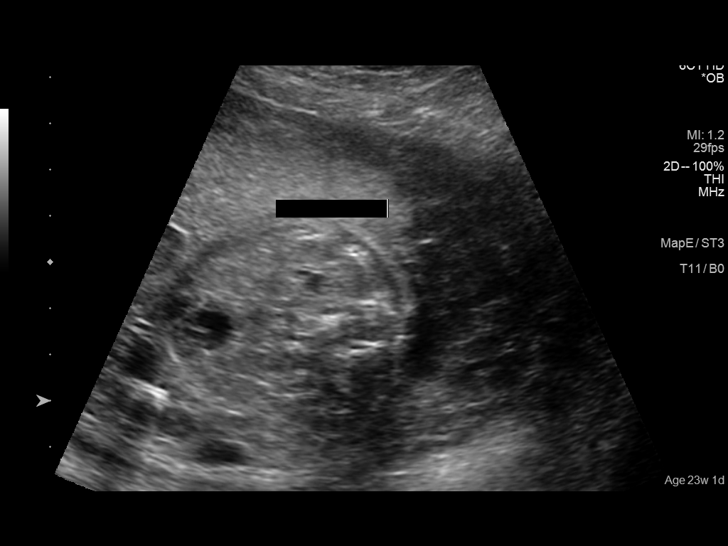
[im 55/78]
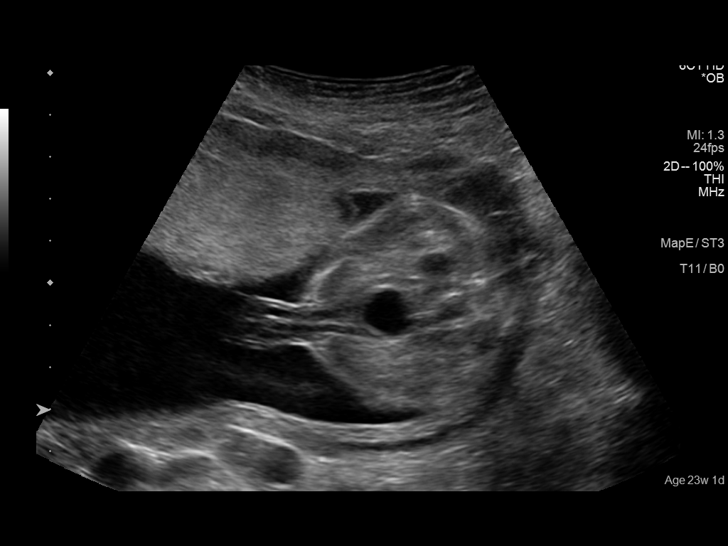
[im 60/78]
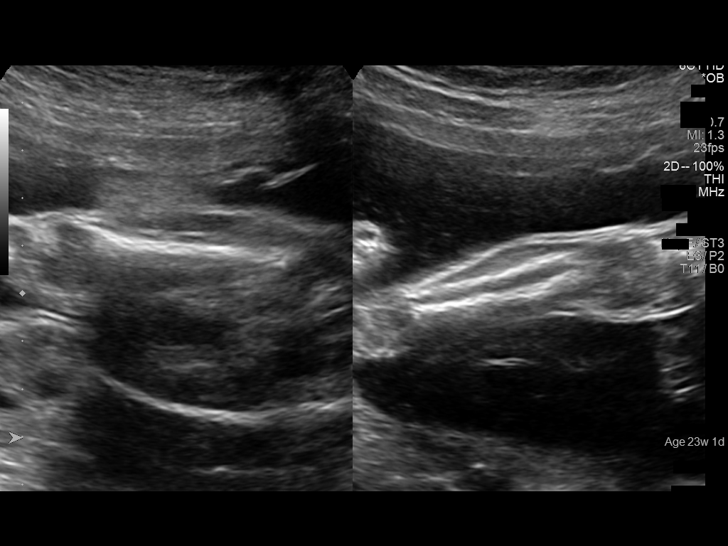
[im 66/78]
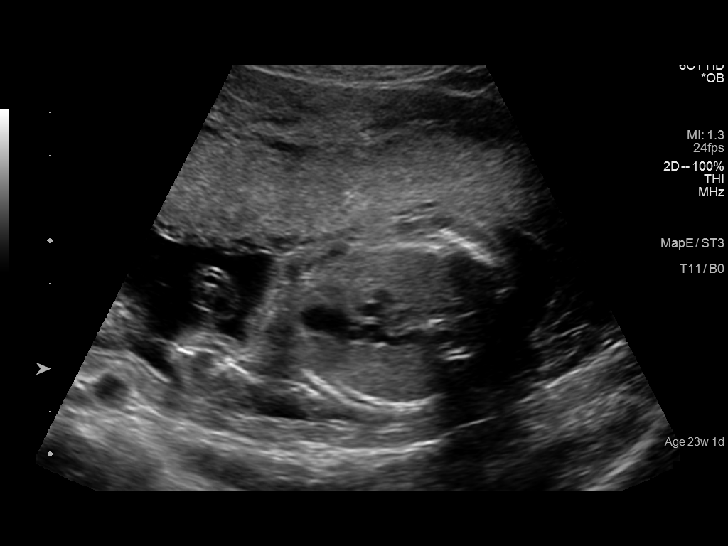
[im 72/78]
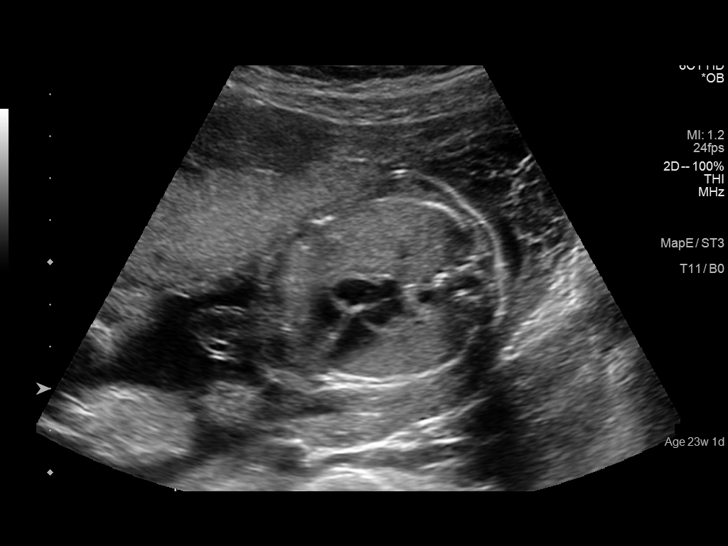
[im 78/78]
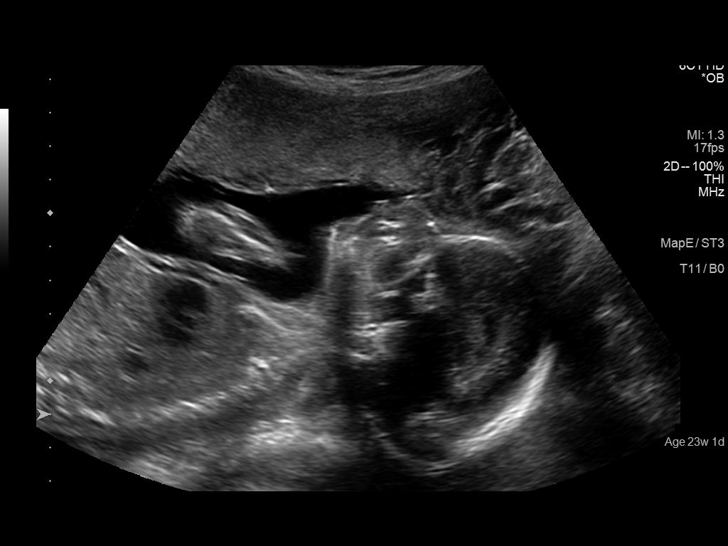

[14 of 28 positions shown; findings below may reference images not displayed]

FINDINGS: Number of Fetuses: 1

Heart Rate:  141 bpm

Movement: Present

Presentation: Cephalic

Previa: No

Placental Location: Anterior

Amniotic Fluid (Subjective): Normal

FETAL BIOMETRY

BPD: 5.6cm 23w 0d

HC:   21.0cm 23w 1d

AC:   18.5cm 23w 2d

FL:   4.1cm 23w 1d

Current Mean GA: 23w 1d                     US EDC: 07/14/2021

Assigned GA:  23w 1d            assigned EDC: 07/14/2021

FETAL ANATOMY

Lateral Ventricles: Appears normal

Thalami/CSP: Appears normal

Posterior Fossa:  Appears normal

Nuchal Region: Appears normal NFT=

Upper Lip: Appears normal

Spine: Appears norm

4 Chamber Heart on Left: Appears normal

LVOT: Appears normal

RVOT: Appears normal

Stomach on Left: Appears normal

3 Vessel Cord: Appears normal

Cord Insertion site: Appears normal

Kidneys: Appears normal

Bladder: Appears normal

Extremities: Appears normal

Maternal Findings:

Cervix:  3.5 cm and closed.
IMPRESSION: Single viable intrauterine pregnancy at 23 weeks 1 day. Cephalic
presentation. No focal fetal abnormality is identified.

## 2021-06-26 ENCOUNTER — Other Ambulatory Visit: Payer: Self-pay

## 2021-06-26 ENCOUNTER — Ambulatory Visit (INDEPENDENT_AMBULATORY_CARE_PROVIDER_SITE_OTHER): Payer: Medicaid Other | Admitting: Certified Nurse Midwife

## 2021-06-26 ENCOUNTER — Encounter: Payer: Self-pay | Admitting: Certified Nurse Midwife

## 2021-06-26 VITALS — BP 135/87 | HR 87 | Wt 156.8 lb

## 2021-06-26 DIAGNOSIS — Z3A37 37 weeks gestation of pregnancy: Secondary | ICD-10-CM

## 2021-06-26 DIAGNOSIS — Z3483 Encounter for supervision of other normal pregnancy, third trimester: Secondary | ICD-10-CM

## 2021-06-26 LAB — POCT URINALYSIS DIPSTICK OB
Bilirubin, UA: NEGATIVE
Blood, UA: NEGATIVE
Glucose, UA: NEGATIVE
Ketones, UA: NEGATIVE
Leukocytes, UA: NEGATIVE
Nitrite, UA: NEGATIVE
POC,PROTEIN,UA: NEGATIVE
Spec Grav, UA: 1.01 (ref 1.010–1.025)
Urobilinogen, UA: 0.2 E.U./dL
pH, UA: 6 (ref 5.0–8.0)

## 2021-06-26 NOTE — Progress Notes (Signed)
ROB doing well, feeling good movement. Has some contractions last night that resolved. Labor precautions reviewed.SVE per pt request.  1.5/50/-3.  Follow up 1 wk.   Philip Aspen, CNM

## 2021-06-26 NOTE — Patient Instructions (Signed)
[employer]'s Emergency Medicine: Concepts and Clinical Practice (9th ed., pp. 2296- 2312). Elsevier.">  Braxton Hicks Contractions  Contractions of the uterus can occur throughout pregnancy, but they are not always a sign that you are in labor. You may have practice contractions called Braxton Hicks contractions. These false labor contractions are sometimesconfused with true labor. What are [place] contractions? Braxton Hicks contractions are tightening movements that occur in the muscles of the uterus before labor. Unlike true labor contractions, these contractions do not result in opening (dilation) and thinning of the lowest part of the uterus (cervix). Toward the end of pregnancy (32-34 weeks), Braxton Hicks contractions can happen more often and may become stronger. These contractions are sometimesdifficult to tell apart from true labor because they can be very uncomfortable. How to tell the difference between true labor and false labor True labor Contractions last 30-70 seconds. Contractions become very regular. Discomfort is usually felt in the top of the uterus, and it spreads to the lower abdomen and low back. Contractions do not go away with walking. Contractions usually become stronger and more frequent. The cervix dilates and gets thinner. False labor Contractions are usually shorter, weaker, and farther apart than true labor contractions. Contractions are usually irregular. Contractions are often felt in the front of the lower abdomen and in the groin. Contractions may go away when you walk around or change positions while lying down. The cervix usually does not dilate or become thin. Sometimes, the only way to tell if you are in true labor is for your healthcare provider to look for changes in your cervix. Your health care provider will do a physical exam and may monitor your contractions. If you are in true labor, your health care provider will send youhome with instructions  about when to return to the hospital. You may continue to have Braxton Hicks contractions until you go into truelabor. Follow these instructions at home:  Take over-the-counter and prescription medicines only as told by your health care provider. If Braxton Hicks contractions are making you uncomfortable: Change your position from lying down or resting to walking, or change from walking to resting. Sit and rest in a tub of warm water. Drink enough fluid to keep your urine pale yellow. Dehydration may cause these contractions. Do slow and deep breathing several times an hour. Keep all follow-up visits. This is important. Contact a health care provider if: You have a fever. You have continuous pain in your abdomen. Your contractions become stronger, more regular, and closer together. You pass blood-tinged mucus. Get help right away if: You have fluid leaking or gushing from your vagina. You have bright red blood coming from your vagina. Your baby is not moving inside you as much as it used to. Summary You may have practice contractions called Braxton Hicks contractions. These false labor contractions are sometimes confused with true labor. Braxton Hicks contractions are usually shorter, weaker, farther apart, and less regular than true labor contractions. True labor contractions usually become stronger, more regular, and more frequent. Manage discomfort from [facility] contractions by changing position, resting in a warm bath, practicing deep breathing, and drinking plenty of water. Keep all follow-up visits. Contact your health care provider if your contractions become stronger, more regular, and closer together. This information is not intended to replace advice given to you by your health care provider. Make sure you discuss any questions you have with your healthcare provider. Document Revised: 08/25/2020 Document Reviewed: 08/25/2020 Elsevier Patient Education  2022 Elsevier  Inc.  

## 2021-07-01 ENCOUNTER — Other Ambulatory Visit: Payer: Self-pay

## 2021-07-01 ENCOUNTER — Ambulatory Visit (INDEPENDENT_AMBULATORY_CARE_PROVIDER_SITE_OTHER): Payer: Medicaid Other | Admitting: Certified Nurse Midwife

## 2021-07-01 VITALS — BP 130/79 | HR 98 | Wt 160.9 lb

## 2021-07-01 DIAGNOSIS — Z3A38 38 weeks gestation of pregnancy: Secondary | ICD-10-CM

## 2021-07-01 DIAGNOSIS — Z3483 Encounter for supervision of other normal pregnancy, third trimester: Secondary | ICD-10-CM

## 2021-07-01 LAB — POCT URINALYSIS DIPSTICK OB
Bilirubin, UA: NEGATIVE
Blood, UA: NEGATIVE
Glucose, UA: NEGATIVE
Ketones, UA: NEGATIVE
Leukocytes, UA: NEGATIVE
Nitrite, UA: NEGATIVE
POC,PROTEIN,UA: NEGATIVE
Spec Grav, UA: <=1.005 — AB (ref 1.010–1.025)
Urobilinogen, UA: 0.2 E.U./dL
pH, UA: 7 (ref 5.0–8.0)

## 2021-07-01 NOTE — Patient Instructions (Signed)
Braxton Hicks Contractions Contractions of the uterus can occur throughout pregnancy, but they are not always a sign that you are in labor. You may have practice contractions called Braxton Hicks contractions. These false labor contractions are sometimes confused with true labor. What are [place] contractions? Braxton Hicks contractions are tightening movements that occur in the muscles of the uterus before labor. Unlike true labor contractions, these contractions do not result in opening (dilation) and thinning of the lowest part of the uterus (cervix). Toward the end of pregnancy (32-34 weeks), Braxton Hicks contractions can happen more often and may become stronger. These contractions are sometimes difficult to tell apart from true labor because they can be very uncomfortable. How to tell the difference between true labor and false labor True labor Contractions last 30-70 seconds. Contractions become very regular. Discomfort is usually felt in the top of the uterus, and it spreads to the lower abdomen and low back. Contractions do not go away with walking. Contractions usually become stronger and more frequent. The cervix dilates and gets thinner. False labor Contractions are usually shorter, weaker, and farther apart than true labor contractions. Contractions are usually irregular. Contractions are often felt in the front of the lower abdomen and in the groin. Contractions may go away when you walk around or change positions while lying down. The cervix usually does not dilate or become thin. Sometimes, the only way to tell if you are in true labor is for your health care provider to look for changes in your cervix. Your health care provider will do a physical exam and may monitor your contractions. If you are in true labor, your health care provider will send you home with instructions about when to return to the hospital. You may continue to have Braxton Hicks contractions until you  go into true labor. Follow these instructions at home:  Take over-the-counter and prescription medicines only as told by your health care provider. If Braxton Hicks contractions are making you uncomfortable: Change your position from lying down or resting to walking, or change from walking to resting. Sit and rest in a tub of warm water. Drink enough fluid to keep your urine pale yellow. Dehydration may cause these contractions. Do slow and deep breathing several times an hour. Keep all follow-up visits. This is important. Contact a health care provider if: You have a fever. You have continuous pain in your abdomen. Your contractions become stronger, more regular, and closer together. You pass blood-tinged mucus. Get help right away if: You have fluid leaking or gushing from your vagina. You have bright red blood coming from your vagina. Your baby is not moving inside you as much as it used to. Summary You may have practice contractions called Braxton Hicks contractions. These false labor contractions are sometimes confused with true labor. Braxton Hicks contractions are usually shorter, weaker, farther apart, and less regular than true labor contractions. True labor contractions usually become stronger, more regular, and more frequent. Manage discomfort from [facility] contractions by changing position, resting in a warm bath, practicing deep breathing, and drinking plenty of water. Keep all follow-up visits. Contact your health care provider if your contractions become stronger, more regular, and closer together. This information is not intended to replace advice given to you by your health care provider. Make sure you discuss any questions you have with your health care provider. Document Revised: 08/25/2020 Document Reviewed: 08/25/2020 Elsevier Patient Education  2022 [employer].

## 2021-07-01 NOTE — Progress Notes (Signed)
ROB doing well, feeling good fetal movement. Discussed labor precautions . All questions answered.SVE per pt request. 2/60/-2.  Follow up 1 wk with MDs ( due to scheduled vacations time) . She verbalizes and agrees.   Philip Aspen, CNM

## 2021-07-02 ENCOUNTER — Encounter: Payer: Medicaid Other | Admitting: Certified Nurse Midwife

## 2021-07-07 ENCOUNTER — Other Ambulatory Visit: Payer: Self-pay

## 2021-07-07 ENCOUNTER — Ambulatory Visit (INDEPENDENT_AMBULATORY_CARE_PROVIDER_SITE_OTHER): Payer: Medicaid Other | Admitting: Obstetrics and Gynecology

## 2021-07-07 ENCOUNTER — Encounter: Payer: Self-pay | Admitting: Obstetrics and Gynecology

## 2021-07-07 VITALS — BP 154/96 | HR 92 | Wt 160.6 lb

## 2021-07-07 DIAGNOSIS — Z3A39 39 weeks gestation of pregnancy: Secondary | ICD-10-CM

## 2021-07-07 DIAGNOSIS — Z3483 Encounter for supervision of other normal pregnancy, third trimester: Secondary | ICD-10-CM

## 2021-07-07 LAB — POCT URINALYSIS DIPSTICK OB
Bilirubin, UA: NEGATIVE
Blood, UA: NEGATIVE
Glucose, UA: NEGATIVE
Ketones, UA: NEGATIVE
Leukocytes, UA: NEGATIVE
Nitrite, UA: NEGATIVE
Odor: NEGATIVE
POC,PROTEIN,UA: NEGATIVE
Spec Grav, UA: 1.01 (ref 1.010–1.025)
Urobilinogen, UA: 0.2 E.U./dL
pH, UA: 7 (ref 5.0–8.0)

## 2021-07-07 NOTE — Progress Notes (Signed)
ROB: No complaints.  Has daily contractions but not "regular".  Daily fetal movement.  Signs and symptoms of labor reviewed.  NST with next visit.  Discussed possible induction if patient goes beyond [facility].

## 2021-07-07 NOTE — Progress Notes (Signed)
Rob- no complaints. Wants cervix check.

## 2021-07-13 ENCOUNTER — Encounter: Payer: Self-pay | Admitting: Certified Nurse Midwife

## 2021-07-13 ENCOUNTER — Ambulatory Visit (INDEPENDENT_AMBULATORY_CARE_PROVIDER_SITE_OTHER): Payer: Medicaid Other | Admitting: Certified Nurse Midwife

## 2021-07-13 ENCOUNTER — Other Ambulatory Visit: Payer: Medicaid Other

## 2021-07-13 ENCOUNTER — Other Ambulatory Visit: Payer: Self-pay

## 2021-07-13 ENCOUNTER — Encounter: Payer: Self-pay | Admitting: Obstetrics and Gynecology

## 2021-07-13 ENCOUNTER — Inpatient Hospital Stay
Admission: EM | Admit: 2021-07-13 | Discharge: 2021-07-15 | DRG: 807 | Disposition: A | Discharge status: Home / Self Care | Payer: Medicaid Other | Attending: Certified Nurse Midwife | Admitting: Certified Nurse Midwife

## 2021-07-13 VITALS — BP 125/80 | HR 61 | Wt 163.1 lb

## 2021-07-13 DIAGNOSIS — Z3A40 40 weeks gestation of pregnancy: Secondary | ICD-10-CM

## 2021-07-13 DIAGNOSIS — Z3403 Encounter for supervision of normal first pregnancy, third trimester: Secondary | ICD-10-CM | POA: Diagnosis not present

## 2021-07-13 DIAGNOSIS — O4292 Full-term premature rupture of membranes, unspecified as to length of time between rupture and onset of labor: Principal | ICD-10-CM | POA: Diagnosis present

## 2021-07-13 DIAGNOSIS — Z20822 Contact with and (suspected) exposure to covid-19: Secondary | ICD-10-CM | POA: Diagnosis present

## 2021-07-13 DIAGNOSIS — Z3A39 39 weeks gestation of pregnancy: Secondary | ICD-10-CM

## 2021-07-13 DIAGNOSIS — Z87891 Personal history of nicotine dependence: Secondary | ICD-10-CM

## 2021-07-13 DIAGNOSIS — O480 Post-term pregnancy: Secondary | ICD-10-CM

## 2021-07-13 LAB — POCT URINALYSIS DIPSTICK OB
Bilirubin, UA: NEGATIVE
Blood, UA: NEGATIVE
Glucose, UA: NEGATIVE
Ketones, UA: NEGATIVE
Leukocytes, UA: NEGATIVE
Nitrite, UA: NEGATIVE
POC,PROTEIN,UA: NEGATIVE
Spec Grav, UA: 1.015 (ref 1.010–1.025)
Urobilinogen, UA: 0.2 E.U./dL
pH, UA: 6.5 (ref 5.0–8.0)

## 2021-07-13 LAB — RUPTURE OF MEMBRANE (ROM)PLUS: Rom Plus: POSITIVE

## 2021-07-13 NOTE — OB Triage Note (Signed)
Pt arrived to [facility] with complaints of ctx q2 min and LOF that occurred at 2200. Pt denies vaginal bleeding. Pt states positive fetal movement. Monitors applied and assessing. Initial FHT 125.

## 2021-07-13 NOTE — Progress Notes (Signed)
ROB: She is here today for routine prenatal visit and NST. She has been having pain and contractions.

## 2021-07-13 NOTE — Patient Instructions (Signed)
Braxton Hicks Contractions Contractions of the uterus can occur throughout pregnancy, but they are not always a sign that you are in labor. You may have practice contractions called Braxton Hicks contractions. These false labor contractions are sometimes confused with true labor. What are [place] contractions? Braxton Hicks contractions are tightening movements that occur in the muscles of the uterus before labor. Unlike true labor contractions, these contractions do not result in opening (dilation) and thinning of the lowest part of the uterus (cervix). Toward the end of pregnancy (32-34 weeks), Braxton Hicks contractions can happen more often and may become stronger. These contractions are sometimes difficult to tell apart from true labor because they can be very uncomfortable. How to tell the difference between true labor and false labor True labor Contractions last 30-70 seconds. Contractions become very regular. Discomfort is usually felt in the top of the uterus, and it spreads to the lower abdomen and low back. Contractions do not go away with walking. Contractions usually become stronger and more frequent. The cervix dilates and gets thinner. False labor Contractions are usually shorter, weaker, and farther apart than true labor contractions. Contractions are usually irregular. Contractions are often felt in the front of the lower abdomen and in the groin. Contractions may go away when you walk around or change positions while lying down. The cervix usually does not dilate or become thin. Sometimes, the only way to tell if you are in true labor is for your health care provider to look for changes in your cervix. Your health care provider will do a physical exam and may monitor your contractions. If you are in true labor, your health care provider will send you home with instructions about when to return to the hospital. You may continue to have Braxton Hicks contractions until you  go into true labor. Follow these instructions at home:  Take over-the-counter and prescription medicines only as told by your health care provider. If Braxton Hicks contractions are making you uncomfortable: Change your position from lying down or resting to walking, or change from walking to resting. Sit and rest in a tub of warm water. Drink enough fluid to keep your urine pale yellow. Dehydration may cause these contractions. Do slow and deep breathing several times an hour. Keep all follow-up visits. This is important. Contact a health care provider if: You have a fever. You have continuous pain in your abdomen. Your contractions become stronger, more regular, and closer together. You pass blood-tinged mucus. Get help right away if: You have fluid leaking or gushing from your vagina. You have bright red blood coming from your vagina. Your baby is not moving inside you as much as it used to. Summary You may have practice contractions called Braxton Hicks contractions. These false labor contractions are sometimes confused with true labor. Braxton Hicks contractions are usually shorter, weaker, farther apart, and less regular than true labor contractions. True labor contractions usually become stronger, more regular, and more frequent. Manage discomfort from [facility] contractions by changing position, resting in a warm bath, practicing deep breathing, and drinking plenty of water. Keep all follow-up visits. Contact your health care provider if your contractions become stronger, more regular, and closer together. This information is not intended to replace advice given to you by your health care provider. Make sure you discuss any questions you have with your health care provider. Document Revised: 08/25/2020 Document Reviewed: 08/25/2020 Elsevier Patient Education  2022 [employer].

## 2021-07-13 NOTE — Progress Notes (Signed)
ROB and NST for 39.6 wks. Discussed induction, pt is agreeable. L&D called only available this week is tomorrow. PT is in agreement. SVE per pt request. 3/70/-2.   NST: reactive Baseline: 120 Variability: moderate Accelerations: Present Decelerations: absent  CTX: occ  Philip Aspen, CNM

## 2021-07-13 NOTE — Addendum Note (Signed)
Addended by: Hildred Priest on: 07/13/2021 09:22 AM   Modules accepted: Orders, SmartSet

## 2021-07-14 ENCOUNTER — Encounter: Payer: Self-pay | Admitting: Certified Nurse Midwife

## 2021-07-14 ENCOUNTER — Inpatient Hospital Stay: Payer: Medicaid Other | Admitting: Certified Registered"

## 2021-07-14 DIAGNOSIS — Z87891 Personal history of nicotine dependence: Secondary | ICD-10-CM | POA: Diagnosis not present

## 2021-07-14 DIAGNOSIS — O4292 Full-term premature rupture of membranes, unspecified as to length of time between rupture and onset of labor: Secondary | ICD-10-CM | POA: Diagnosis present

## 2021-07-14 DIAGNOSIS — O42013 Preterm premature rupture of membranes, onset of labor within 24 hours of rupture, third trimester: Secondary | ICD-10-CM

## 2021-07-14 DIAGNOSIS — Z3A40 40 weeks gestation of pregnancy: Secondary | ICD-10-CM | POA: Diagnosis not present

## 2021-07-14 DIAGNOSIS — Z20822 Contact with and (suspected) exposure to covid-19: Secondary | ICD-10-CM | POA: Diagnosis present

## 2021-07-14 LAB — RPR: RPR Ser Ql: NONREACTIVE

## 2021-07-14 LAB — RESP PANEL BY RT-PCR (FLU A&B, COVID) ARPGX2
Influenza A by PCR: NEGATIVE
Influenza B by PCR: NEGATIVE
SARS Coronavirus 2 by RT PCR: NEGATIVE

## 2021-07-14 LAB — CBC
HCT: 29.8 % — ABNORMAL LOW (ref 36.0–46.0)
Hemoglobin: 9.7 g/dL — ABNORMAL LOW (ref 12.0–15.0)
MCH: 24.1 pg — ABNORMAL LOW (ref 26.0–34.0)
MCHC: 32.6 g/dL (ref 30.0–36.0)
MCV: 73.9 fL — ABNORMAL LOW (ref 80.0–100.0)
Platelets: 294 10*3/uL (ref 150–400)
RBC: 4.03 MIL/uL (ref 3.87–5.11)
RDW: 13.9 % (ref 11.5–15.5)
WBC: 13.7 10*3/uL — ABNORMAL HIGH (ref 4.0–10.5)
nRBC: 0 % (ref 0.0–0.2)

## 2021-07-14 LAB — TYPE AND SCREEN
ABO/RH(D): A POS
Antibody Screen: NEGATIVE

## 2021-07-14 MED ORDER — SIMETHICONE 80 MG PO CHEW: 80.0000 mg | CHEWABLE_TABLET | ORAL | Status: DC | PRN

## 2021-07-14 MED ORDER — ONDANSETRON HCL 4 MG/2ML IJ SOLN: 4.0000 mg | Freq: Four times a day (QID) | INTRAMUSCULAR | Status: DC | PRN

## 2021-07-14 MED ORDER — MISOPROSTOL 200 MCG PO TABS
ORAL_TABLET | ORAL | Status: AC
  Filled 2021-07-14: qty 4

## 2021-07-14 MED ORDER — ONDANSETRON HCL 4 MG/2ML IJ SOLN: 4.0000 mg | INTRAMUSCULAR | Status: DC | PRN

## 2021-07-14 MED ORDER — LIDOCAINE HCL (PF) 1 % IJ SOLN
INTRAMUSCULAR | Status: DC | PRN
  Administered 2021-07-14: 3 mL

## 2021-07-14 MED ORDER — OXYTOCIN BOLUS FROM INFUSION
333.0000 mL | Freq: Once | INTRAVENOUS | Status: AC
Start: 2021-07-14 — End: 2021-07-14
  Administered 2021-07-14: 333 mL via INTRAVENOUS

## 2021-07-14 MED ORDER — LACTATED RINGERS IV SOLN: INTRAVENOUS | Status: DC

## 2021-07-14 MED ORDER — TERBUTALINE SULFATE 1 MG/ML IJ SOLN
0.2500 mg | Freq: Once | INTRAMUSCULAR | Status: DC | PRN
Start: 2021-07-14 — End: 2021-07-15

## 2021-07-14 MED ORDER — BUPIVACAINE HCL (PF) 0.25 % IJ SOLN
INTRAMUSCULAR | Status: DC | PRN
  Administered 2021-07-14: 2 mL via EPIDURAL
  Administered 2021-07-14: 4 mL via EPIDURAL

## 2021-07-14 MED ORDER — OXYTOCIN-SODIUM CHLORIDE 30-0.9 UT/500ML-% IV SOLN
2.5000 [IU]/h | INTRAVENOUS | Status: DC
  Administered 2021-07-14: 2.5 [IU]/h via INTRAVENOUS
  Filled 2021-07-14 (×2): qty 500

## 2021-07-14 MED ORDER — FENTANYL-BUPIVACAINE-NACL 0.5-0.125-0.9 MG/250ML-% EP SOLN
EPIDURAL | Status: DC | PRN
  Administered 2021-07-14: 12 mL/h via EPIDURAL

## 2021-07-14 MED ORDER — IBUPROFEN 600 MG PO TABS
600.0000 mg | ORAL_TABLET | Freq: Four times a day (QID) | ORAL | Status: DC
  Administered 2021-07-14 – 2021-07-15 (×3): 600 mg via ORAL
  Filled 2021-07-14 (×3): qty 1

## 2021-07-14 MED ORDER — METHYLERGONOVINE MALEATE 0.2 MG PO TABS
0.2000 mg | ORAL_TABLET | ORAL | Status: DC | PRN
Start: 2021-07-14 — End: 2021-07-15

## 2021-07-14 MED ORDER — TERBUTALINE SULFATE 1 MG/ML IJ SOLN: 0.2500 mg | Freq: Once | INTRAMUSCULAR | Status: DC | PRN

## 2021-07-14 MED ORDER — PRENATAL MULTIVITAMIN CH
1.0000 | ORAL_TABLET | Freq: Every day | ORAL | Status: DC
  Administered 2021-07-14: 1 via ORAL
  Filled 2021-07-14: qty 1

## 2021-07-14 MED ORDER — BUTORPHANOL TARTRATE 1 MG/ML IJ SOLN
1.0000 mg | INTRAMUSCULAR | Status: DC | PRN
  Administered 2021-07-14 (×2): 1 mg via INTRAVENOUS
  Filled 2021-07-14 (×2): qty 1

## 2021-07-14 MED ORDER — OXYCODONE-ACETAMINOPHEN 5-325 MG PO TABS
2.0000 | ORAL_TABLET | ORAL | Status: DC | PRN
Start: 2021-07-14 — End: 2021-07-15

## 2021-07-14 MED ORDER — METHYLERGONOVINE MALEATE 0.2 MG/ML IJ SOLN
0.2000 mg | INTRAMUSCULAR | Status: DC | PRN
Start: 2021-07-14 — End: 2021-07-15

## 2021-07-14 MED ORDER — LIDOCAINE-EPINEPHRINE (PF) 1.5 %-1:200000 IJ SOLN
INTRAMUSCULAR | Status: DC | PRN
  Administered 2021-07-14: 3 mL via PERINEURAL

## 2021-07-14 MED ORDER — FENTANYL-BUPIVACAINE-NACL 0.5-0.125-0.9 MG/250ML-% EP SOLN
EPIDURAL | Status: AC
  Filled 2021-07-14: qty 250

## 2021-07-14 MED ORDER — WITCH HAZEL-GLYCERIN EX PADS: 1.0000 "application " | MEDICATED_PAD | CUTANEOUS | Status: DC | PRN

## 2021-07-14 MED ORDER — OXYTOCIN 10 UNIT/ML IJ SOLN
INTRAMUSCULAR | Status: AC
  Filled 2021-07-14: qty 2

## 2021-07-14 MED ORDER — SENNOSIDES-DOCUSATE SODIUM 8.6-50 MG PO TABS
2.0000 | ORAL_TABLET | ORAL | Status: DC
  Administered 2021-07-14: 2 via ORAL
  Filled 2021-07-14: qty 2

## 2021-07-14 MED ORDER — BENZOCAINE-MENTHOL 20-0.5 % EX AERO
1.0000 "application " | INHALATION_SPRAY | CUTANEOUS | Status: DC | PRN
  Administered 2021-07-14: 1 via TOPICAL
  Filled 2021-07-14: qty 56

## 2021-07-14 MED ORDER — LIDOCAINE HCL (PF) 1 % IJ SOLN
INTRAMUSCULAR | Status: AC
  Filled 2021-07-14: qty 30

## 2021-07-14 MED ORDER — ACETAMINOPHEN 325 MG PO TABS
650.0000 mg | ORAL_TABLET | ORAL | Status: DC | PRN
  Administered 2021-07-15: 650 mg via ORAL
  Filled 2021-07-14: qty 2

## 2021-07-14 MED ORDER — OXYCODONE-ACETAMINOPHEN 5-325 MG PO TABS: 1.0000 | ORAL_TABLET | ORAL | Status: DC | PRN

## 2021-07-14 MED ORDER — COCONUT OIL OIL
1.0000 "application " | TOPICAL_OIL | Status: DC | PRN
  Filled 2021-07-14: qty 120

## 2021-07-14 MED ORDER — DIBUCAINE (PERIANAL) 1 % EX OINT: 1.0000 "application " | TOPICAL_OINTMENT | CUTANEOUS | Status: DC | PRN

## 2021-07-14 MED ORDER — DOCUSATE SODIUM 100 MG PO CAPS
100.0000 mg | ORAL_CAPSULE | Freq: Two times a day (BID) | ORAL | Status: DC
  Administered 2021-07-15: 100 mg via ORAL
  Filled 2021-07-14: qty 1

## 2021-07-14 MED ORDER — LIDOCAINE HCL (PF) 1 % IJ SOLN
30.0000 mL | INTRAMUSCULAR | Status: DC | PRN
  Filled 2021-07-14: qty 30

## 2021-07-14 MED ORDER — FERROUS SULFATE 325 (65 FE) MG PO TABS
325.0000 mg | ORAL_TABLET | Freq: Every day | ORAL | Status: DC
  Administered 2021-07-15: 325 mg via ORAL
  Filled 2021-07-14: qty 1

## 2021-07-14 MED ORDER — OXYTOCIN-SODIUM CHLORIDE 30-0.9 UT/500ML-% IV SOLN
1.0000 m[IU]/min | INTRAVENOUS | Status: DC
Start: 2021-07-14 — End: 2021-07-14
  Administered 2021-07-14: 6 m[IU]/min via INTRAVENOUS

## 2021-07-14 MED ORDER — LACTATED RINGERS IV SOLN: 500.0000 mL | INTRAVENOUS | Status: DC | PRN

## 2021-07-14 MED ORDER — MISOPROSTOL 25 MCG QUARTER TABLET
50.0000 ug | ORAL_TABLET | ORAL | Status: DC | PRN
  Filled 2021-07-14: qty 2

## 2021-07-14 MED ORDER — ONDANSETRON HCL 4 MG PO TABS: 4.0000 mg | ORAL_TABLET | ORAL | Status: DC | PRN

## 2021-07-14 MED ORDER — SOD CITRATE-CITRIC ACID 500-334 MG/5ML PO SOLN: 30.0000 mL | ORAL | Status: DC | PRN

## 2021-07-14 MED ORDER — AMMONIA AROMATIC IN INHA
RESPIRATORY_TRACT | Status: AC
  Filled 2021-07-14: qty 10

## 2021-07-14 MED ORDER — OXYTOCIN-SODIUM CHLORIDE 30-0.9 UT/500ML-% IV SOLN
1.0000 m[IU]/min | INTRAVENOUS | Status: DC
  Administered 2021-07-14: 2 m[IU]/min via INTRAVENOUS
  Filled 2021-07-14: qty 500

## 2021-07-14 NOTE — Lactation Note (Signed)
This note was copied from a baby's chart. Lactation Consultation Note  Patient Name: Kathleen Glass [id number] Date: 07/14/2021 Reason for consult: L&D Initial assessment;Term Age:24 hours  Lactation at bedside with initial assessment. Baby in football hold at Left breast, latched well, flanged lips, no discomfort noted by mom.  Mom has bilateral nipple piercing's, she has removed the L set, but has not the R at this time. Discussed removing before any feedings due to choking hazard. Mom verbalizes understanding.   Encouraged feeding with cues/on demand, ensure deep latch positioning turned in towards mom, and use of hand to help move colostrum easier.   Maternal Data Has patient been taught Hand Expression?: Yes Does the patient have breastfeeding experience prior to this delivery?: Yes How long did the patient breastfeed?: not long  Feeding Mother's Current Feeding Choice: Breast Milk  LATCH Score Latch: Grasps breast easily, tongue down, lips flanged, rhythmical sucking.  Audible Swallowing: A few with stimulation  Type of Nipple: Everted at rest and after stimulation  Comfort (Breast/Nipple): Soft / non-tender (mom did have to remove nipple piercings)  Hold (Positioning): Assistance needed to correctly position infant at breast and maintain latch.  LATCH Score: 8   Lactation Tools Discussed/Used    Interventions Interventions: Breast feeding basics reviewed;Hand express;Education (BF and nipple piercings)  Discharge    Consult Status Consult Status: Follow-up Date: 07/14/21 Follow-up type: In-patient    Lavonia Drafts 07/14/2021, 3:33 PM

## 2021-07-14 NOTE — Anesthesia Procedure Notes (Signed)
Epidural Patient location during procedure: OB Start time: 07/14/2021 7:12 AM End time: 07/14/2021 7:30 AM  Staffing Anesthesiologist: Emmie Niemann, MD Resident/CRNA: Jerrye Noble, CRNA Performed: resident/CRNA   Preanesthetic Checklist Completed: patient identified, IV checked, site marked, risks and benefits discussed, surgical consent, monitors and equipment checked, pre-op evaluation and timeout performed  Epidural Patient position: sitting Prep: ChloraPrep Patient monitoring: heart rate, continuous pulse ox and blood pressure Approach: midline Location: L3-L4 Injection technique: LOR saline  Needle:  Needle type: Tuohy  Needle gauge: 17 G Needle length: 9 cm and 9 Needle insertion depth: 6 cm Catheter type: closed end flexible Catheter size: 19 Gauge Catheter at skin depth: 11 cm Test dose: negative and 1.5% lidocaine with Epi 1:200 K  Assessment Sensory level: T10 Events: blood not aspirated, injection not painful, no injection resistance, no paresthesia and negative IV test  Additional Notes 1 attempt Pt. Evaluated and documentation done after procedure finished. Patient identified. Risks/Benefits/Options discussed with patient including but not limited to bleeding, infection, nerve damage, paralysis, failed block, incomplete pain control, headache, blood pressure changes, nausea, vomiting, reactions to medication both or allergic, itching and postpartum back pain. Confirmed with bedside nurse the patient's most recent platelet count. Confirmed with patient that they are not currently taking any anticoagulation, have any bleeding history or any family history of bleeding disorders. Patient expressed understanding and wished to proceed. All questions were answered. Sterile technique was used throughout the entire procedure. Please see nursing notes for vital signs. Test dose was given through epidural catheter and negative prior to continuing to dose epidural or start  infusion. Warning signs of high block given to the patient including shortness of breath, tingling/numbness in hands, complete motor block, or any concerning symptoms with instructions to call for help. Patient was given instructions on fall risk and not to get out of bed. All questions and concerns addressed with instructions to call with any issues or inadequate analgesia.    Patient tolerated the insertion well without immediate complications.Reason for block:procedure for pain

## 2021-07-14 NOTE — H&P (Signed)
History and Physical   HPI  Kathleen Glass is a 25 y.o. G2P1001 at 39w0dEstimated Date of Delivery: 07/14/21 who is being admitted for PROM.    OB History  OB History  Gravida Para Term Preterm AB Living  '2 1 1 '$ 0 0 1  SAB IAB Ectopic Multiple Live Births  0 0 0 0 1    # Outcome Date GA Lbr Len/2nd Weight Sex Delivery Anes PTL Lv  2 Current           1 Term 10/07/17 427w4d8:47 / 01:49 3160 g F Vag-Spont EPI  LIV     Name: Kathleen Glass     Apgar1: 7  Apgar5: 8    Obstetric Comments  1st Menstrual Cycle: 11    PROBLEM LIST  Pregnancy complications or risks: Patient Active Problem List   Diagnosis Date Noted   Labor and delivery, indication for care 07/14/2021   History of marijuana use 03/12/2021   Late prenatal care 02/23/2021   Anxiety and depression 07/13/2020   UTI symptoms 05/30/2020   Pelvic pressure in female 12/22/2019   Back pain 12/22/2019   Dyspareunia, female 12/22/2019   Left ovarian cyst 12/22/2019   E. 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         Allergies  Patient has no known allergies.  Review of Systems  Constitutional: negative Eyes:  negative Ears, nose, mouth, throat, and face: negative Respiratory: negative Cardiovascular: negative Gastrointestinal: negative Genitourinary:negative Integument/breast: negative Hematologic/lymphatic: negative Musculoskeletal:negative Neurological: negative Behavioral/Psych: negative Endocrine: negative Allergic/Immunologic: negative  Physical Exam  BP 131/82   Pulse 87   Temp 98.1 F (36.7 C) (Oral)   Resp 18   Wt 74 kg   LMP 09/28/2020 (Within Weeks)   BMI 29.84 kg/m   Lungs:  CTA B Cardio: RRR without M/R/G Abd: Soft, gravid, NT Presentation: cephalic EXT: No C/C/ 1+ Edema DTRs: 2+ B CERVIX: Dilation: 4 Effacement (%): 80 Station: -2 Exam by:: K Allred RN  See Prenatal records for more detailed PE.     FHR:  Baseline: 130 bpm, Variability: Good {> 6 bpm), Accelerations: Reactive, and Decelerations: Absent  Toco: Uterine Contractions: Frequency: Every 2-4 minutes and Intensity: mild to moderate  Test Results  Results for orders placed or performed during the hospital encounter of 07/13/21 (from the past 24 hour(s))  ROM Plus (ARHardinnly)     Status: None   Collection Time: 07/13/21 11:24 PM  Result Value Ref Range   Rom Plus POSITIVE   Resp Panel by RT-PCR (Flu A&B, Covid) Nasopharyngeal Swab     Status: None   Collection Time: 07/13/21 11:31 PM  Specimen: Nasopharyngeal Swab; Nasopharyngeal(NP) swabs in vial transport medium  Result Value Ref Range   SARS Coronavirus 2 by RT PCR NEGATIVE NEGATIVE   Influenza A by PCR NEGATIVE NEGATIVE   Influenza B by PCR NEGATIVE NEGATIVE  Type and screen     Status: None   Collection Time: 07/14/21 12:22 AM  Result Value Ref Range   ABO/RH(D) A POS    Antibody Screen NEG    Sample Expiration      07/17/2021,2359 Performed at [facility], [address] [place],  [address]   CBC     Status: Abnormal   Collection Time: 07/14/21 12:22 AM  Result Value Ref Range   WBC 13.7 (H) 4.0 - 10.5  K/uL   RBC 4.03 3.87 - 5.11 MIL/uL   Hemoglobin 9.7 (L) 12.0 - 15.0 g/dL   HCT 29.8 (L) 36.0 - 46.0 %   MCV 73.9 (L) 80.0 - 100.0 fL   MCH 24.1 (L) 26.0 - 34.0 pg   MCHC 32.6 30.0 - 36.0 g/dL   RDW 13.9 11.5 - 15.5 %   Platelets 294 150 - 400 K/uL   nRBC 0.0 0.0 - 0.2 %   Group B Strep negative  Assessment   G2P1001 at 58w0dEstimated Date of Delivery: 07/14/21  The fetus is reassuring.   Patient Active Problem List   Diagnosis Date Noted   Labor and delivery, indication for care 07/14/2021   History of marijuana use 03/12/2021   Late prenatal care 02/23/2021   Anxiety and depression 07/13/2020   UTI symptoms 05/30/2020   Pelvic pressure in female 12/22/2019   Back pain 12/22/2019   Dyspareunia, female 12/22/2019   Left ovarian cyst 12/22/2019   E. 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 Admitted to L&D :   2. EFM:-- Category 1 3. Stadol or Epidural if desired.   4. Admission labs completed 5. Dr. EAmalia Haileynotified of admission 6. IV pitocin augmentation  APhilip Aspen CNM  07/14/2021 6:06 AM

## 2021-07-14 NOTE — Anesthesia Preprocedure Evaluation (Signed)
Anesthesia Evaluation  Patient identified by MRN, date of birth, ID band Patient awake    Reviewed: Allergy & Precautions, H&P , NPO status , Patient's Chart, lab work & pertinent test results  Airway Mallampati: II       Dental no notable dental hx.    Pulmonary former smoker,           Cardiovascular      Neuro/Psych Anxiety Depression negative neurological ROS     GI/Hepatic Neg liver ROS, GERD  ,  Endo/Other  negative endocrine ROS  Renal/GU negative Renal ROS  negative genitourinary   Musculoskeletal   Abdominal   Peds  Hematology negative hematology ROS (+)   Anesthesia Other Findings   Reproductive/Obstetrics (+) Pregnancy                             Anesthesia Physical Anesthesia Plan  ASA: 2  Anesthesia Plan: Epidural   Post-op Pain Management:    Induction:   PONV Risk Score and Plan:   Airway Management Planned:   Additional Equipment:   Intra-op Plan:   Post-operative Plan:   Informed Consent:   Plan Discussed with: Anesthesiologist and CRNA  Anesthesia Plan Comments:         Anesthesia Quick Evaluation

## 2021-07-15 LAB — CBC
HCT: 26.3 % — ABNORMAL LOW (ref 36.0–46.0)
Hemoglobin: 8.3 g/dL — ABNORMAL LOW (ref 12.0–15.0)
MCH: 23.4 pg — ABNORMAL LOW (ref 26.0–34.0)
MCHC: 31.6 g/dL (ref 30.0–36.0)
MCV: 74.3 fL — ABNORMAL LOW (ref 80.0–100.0)
Platelets: 261 10*3/uL (ref 150–400)
RBC: 3.54 MIL/uL — ABNORMAL LOW (ref 3.87–5.11)
RDW: 14.2 % (ref 11.5–15.5)
WBC: 11.4 10*3/uL — ABNORMAL HIGH (ref 4.0–10.5)
nRBC: 0 % (ref 0.0–0.2)

## 2021-07-15 MED ORDER — IBUPROFEN 600 MG PO TABS: 600.0000 mg | ORAL_TABLET | Freq: Four times a day (QID) | ORAL | 0 refills | Status: DC

## 2021-07-15 MED ORDER — FERROUS SULFATE 325 (65 FE) MG PO TABS: 325.0000 mg | ORAL_TABLET | Freq: Two times a day (BID) | ORAL | 3 refills | Status: AC

## 2021-07-15 NOTE — Final Progress Note (Signed)
Discharge Day SOAP Note:  Progress Note - Vaginal Delivery  Kathleen Glass is a 25 y.o. G2P2002 now PP day 1 s/p Vaginal, Spontaneous . Delivery was uncomplicated  Subjective  The patient has the following complaints: has no unusual complaints  Pain is controlled with current medications.   Patient is urinating without difficulty.  She is ambulating well.     Objective  Vital signs: BP 113/70 (BP Location: Left Arm)   Pulse 70   Temp 98.1 F (36.7 C) (Oral)   Resp 16   Wt 74 kg   LMP 09/28/2020 (Within Weeks)   SpO2 98%   Breastfeeding Unknown   BMI 29.84 kg/m   Physical Exam: Gen: NAD Fundus Fundal Tone: Firm  Lochia Amount: Small        Data Review Labs: Lab Results  Component Value Date   WBC 11.4 (H) 07/15/2021   HGB 8.3 (L) 07/15/2021   HCT 26.3 (L) 07/15/2021   MCV 74.3 (L) 07/15/2021   PLT 261 07/15/2021   CBC Latest Ref Rng & Units 07/15/2021 07/14/2021 05/07/2021  WBC 4.0 - 10.5 K/uL 11.4(H) 13.7(H) 11.8(H)  Hemoglobin 12.0 - 15.0 g/dL 8.3(L) 9.7(L) 10.7(L)  Hematocrit 36.0 - 46.0 % 26.3(L) 29.8(L) 32.8(L)  Platelets 150 - 400 K/uL 261 294 305   A POS  Edinburgh Score: Edinburgh Postnatal Depression Scale Screening Tool 07/14/2021  I have been able to laugh and see the funny side of things. (No Data)    Assessment/Plan  Active Problems:   Labor and delivery, indication for care    Plan for discharge today.  Discharge Instructions: Per After Visit Summary. Activity: Advance as tolerated. Pelvic rest for 6 weeks.  Also refer to After Visit Summary Diet: Regular Medications: Allergies as of 07/15/2021   No Known Allergies      Medication List     TAKE these medications    ibuprofen 600 MG tablet Commonly known as: ADVIL Take 1 tablet (600 mg total) by mouth every 6 (six) hours.   PRENATAL VITAMINS PO Take by mouth.       Outpatient follow up:  Postpartum contraception: Changed her mind about tubal. Will do IUD postpartum.    Discharged Condition: good  Discharged to: home  Newborn Data: Disposition:home with mother  Apgars: APGAR (1 MIN): 8   APGAR (5 MINS): 9   APGAR (10 MINS):    Baby Feeding: Breast   Philip Aspen, CNM  07/15/2021 7:22 AM

## 2021-07-15 NOTE — Progress Notes (Signed)
Patient discharged home with family.  Discharge instructions, when to follow up, and prescriptions reviewed with patient.  Patient verbalized understanding. Patient will be escorted out by auxiliary.   

## 2021-07-15 NOTE — Anesthesia Postprocedure Evaluation (Signed)
Anesthesia Post Note  Patient: Kathleen Glass  Procedure(s) Performed: AN AD HOC LABOR EPIDURAL  Anesthesia Type: Epidural Level of consciousness: awake and alert and oriented Pain management: pain level controlled Vital Signs Assessment: post-procedure vital signs reviewed and stable Respiratory status: respiratory function stable Cardiovascular status: stable Postop Assessment: no headache, no backache, spinal receding, patient able to bend at knees, no apparent nausea or vomiting, able to ambulate and adequate PO intake Anesthetic complications: no   No notable events documented.   Last Vitals:  Vitals:   07/15/21 0400 07/15/21 0755  BP: 113/70 121/85  Pulse: 70 62  Resp: 16 20  Temp: 36.7 C 36.6 C  SpO2: 98% 99%    Last Pain:  Vitals:   07/15/21 0755  TempSrc: Oral  PainSc:                  [name]

## 2021-07-15 NOTE — Lactation Note (Signed)
This note was copied from a baby's chart. Lactation Consultation Note  Patient Name: Kathleen Glass [id number] Date: 07/15/2021 Reason for consult: Follow-up assessment;Term Age:24 hours  Lactation follow-up. Parents desire discharge today, 24hr testing pending.  Baby has several documented feedings, several voids and poop diapers. Mom notes cluster feeding last night, but no pain/discomfort, and opposite breast leaking while baby feeds.  LC provided hand pump for capture abilities of leaking milk- reviewed pump set-up, use, cleaning, and milk storage. Provided storage bottles as well, however encouraged to delay introduction of artifical nipples for first 3-4 weeks for well established breastfeeding.  Reviewed with mom stomach size, feeding pattern and behaviors, cluster feeding, growth spurts, importance of feeding on demand, and benefits of skin to skin.  Guidance given for anticipated breast changes, management of breast fullness and engorgement and nipple care. Encouraged mom to delay insertion of piercing's for longer if possible.   Information given for outpatient lactation services and community breastfeeding support. Encouraged to call with any questions/concerns and for ongoing BF support as needed.  Maternal Data Has patient been taught Hand Expression?: Yes Does the patient have breastfeeding experience prior to this delivery?: Yes How long did the patient breastfeed?: minimal  Feeding Mother's Current Feeding Choice: Breast Milk  LATCH Score                    Lactation Tools Discussed/Used Tools: Pump Breast pump type: Manual Pump Education: Setup, frequency, and cleaning;Milk Storage Reason for Pumping: leaking/mom's choice Pumping frequency: as needed  Interventions Interventions: Breast feeding basics reviewed;Hand express;Pre-pump if needed;Hand pump;Education  Discharge Discharge Education: Engorgement and breast care;Warning signs for feeding  baby;Outpatient recommendation Pump: Manual (given by [facility]) [place] Program: Yes  Consult Status Consult Status: Complete Date: 07/15/21 Follow-up type: Call as needed    Lavonia Drafts 07/15/2021, 10:44 AM

## 2021-07-15 NOTE — Discharge Summary (Addendum)
      Patient Name: Kathleen Glass DOB: January 30, 1996 MRN: [id number]                            Discharge Summary  Date of Admission: 07/13/2021 Date of Discharge: 07/15/2021 Delivering Provider: Philip Aspen   Admitting Diagnosis: Labor and delivery, indication for care [O75.9] at 51w0dSecondary diagnosis:  Active Problems:   Labor and delivery, indication for care   Mode of Delivery: normal spontaneous vaginal delivery              Discharge diagnosis: Term Pregnancy Delivered      Intrapartum Procedures: epidural   Post partum procedures:  none  Complications: none                     Discharge Day SOAP Note:  Progress Note - Vaginal Delivery  Kathleen Surianois a 25y.o. G2P2002 now PP day 1 s/p Vaginal, Spontaneous . Delivery was uncomplicated  Subjective  The patient has the following complaints: has no unusual complaints  Pain is controlled with current medications.   Patient is urinating without difficulty.  She is ambulating well.     Objective  Vital signs: BP 113/70 (BP Location: Left Arm)   Pulse 70   Temp 98.1 F (36.7 C) (Oral)   Resp 16   Wt 74 kg   LMP 09/28/2020 (Within Weeks)   SpO2 98%   Breastfeeding Unknown   BMI 29.84 kg/m   Physical Exam: Gen: NAD Fundus Fundal Tone: Firm  Lochia Amount: Small        Data Review Labs: Lab Results  Component Value Date   WBC 11.4 (H) 07/15/2021   HGB 8.3 (L) 07/15/2021   HCT 26.3 (L) 07/15/2021   MCV 74.3 (L) 07/15/2021   PLT 261 07/15/2021   CBC Latest Ref Rng & Units 07/15/2021 07/14/2021 05/07/2021  WBC 4.0 - 10.5 K/uL 11.4(H) 13.7(H) 11.8(H)  Hemoglobin 12.0 - 15.0 g/dL 8.3(L) 9.7(L) 10.7(L)  Hematocrit 36.0 - 46.0 % 26.3(L) 29.8(L) 32.8(L)  Platelets 150 - 400 K/uL 261 294 305   A POS  Edinburgh Score: Edinburgh Postnatal Depression Scale Screening Tool 07/14/2021  I have been able to laugh and see the funny side of things. (No Data)    Assessment/Plan  Active Problems:   Labor  and delivery, indication for care    Plan for discharge today.  Discharge Instructions: Per After Visit Summary. Activity: Advance as tolerated. Pelvic rest for 6 weeks.  Also refer to After Visit Summary Diet: Regular Medications: Allergies as of 07/15/2021   No Known Allergies      Medication List     TAKE these medications    ferrous sulfate 325 (65 FE) MG tablet Take 1 tablet (325 mg total) by mouth 2 (two) times daily with a meal.   ibuprofen 600 MG tablet Commonly known as: ADVIL Take 1 tablet (600 mg total) by mouth every 6 (six) hours.   PRENATAL VITAMINS PO Take by mouth.       Outpatient follow up:  Postpartum contraception: Changed her mind about tubal. Will do IUD postpartum.   Discharged Condition: good  Discharged to: home  Newborn Data: Disposition:home with mother  Apgars: APGAR (1 MIN): 8   APGAR (5 MINS): 9   APGAR (10 MINS):    Baby Feeding: Breast   APhilip Aspen CNM  07/15/2021 7:25 AM

## 2021-08-05 ENCOUNTER — Encounter: Payer: Self-pay | Admitting: Certified Nurse Midwife

## 2021-08-05 ENCOUNTER — Other Ambulatory Visit: Payer: Self-pay

## 2021-08-05 ENCOUNTER — Ambulatory Visit (INDEPENDENT_AMBULATORY_CARE_PROVIDER_SITE_OTHER): Payer: Medicaid Other | Admitting: Certified Nurse Midwife

## 2021-08-05 DIAGNOSIS — O9270 Unspecified disorders of lactation: Secondary | ICD-10-CM | POA: Insufficient documentation

## 2021-08-05 DIAGNOSIS — Z1331 Encounter for screening for depression: Secondary | ICD-10-CM

## 2021-08-05 DIAGNOSIS — Z9189 Other specified personal risk factors, not elsewhere classified: Secondary | ICD-10-CM

## 2021-08-05 NOTE — Progress Notes (Signed)
Virtual Visit via Telephone Note  I connected with Kathleen Glass on 08/05/21 at 11:30 AM EDT by telephone and verified that I am speaking with the correct person using two identifiers.  Location: Patient: at home Provider: at office    I discussed the limitations, risks, security and privacy concerns of performing an evaluation and management service by telephone and the availability of in person appointments. I also discussed with the patient that there may be a patient responsible charge related to this service. The patient expressed understanding and agreed to proceed.   History of Present Illness: Doing well over all, state she has been having pain with latch more so on the left side and notes that she has had some bleeding . Discussed seeing outpatient lactation to evaluate latch. She is in agreement. She denies pain and is no longer taking any medication for cramping. She states over all her bleeding is declining. It will come and go .   Observations/Objective: GAD 7 : Generalized Anxiety Score 08/05/2021 11/24/2020 09/15/2020 07/11/2020  Nervous, Anxious, on Edge 0 0 1 3  Control/stop worrying 0 0 2 2  Worry too much - different things 0 0 2 2  Trouble relaxing 0 0 2 3  Restless 0 1 2 3   Easily annoyed or irritable 0 0 2 3  Afraid - awful might happen 0 0 2 3  Total GAD 7 Score 0 1 13 19   Anxiety Difficulty - - - Somewhat difficult    PHQ9 SCORE ONLY 08/05/2021 11/24/2020 09/15/2020  PHQ-9 Total Score 3 3 11      Assessment and Plan: Referral placed to lactation. Depression screen negative  Follow Up Instructions: As scheduled for 6 wk pp visit l   I discussed the assessment and treatment plan with the patient. The patient was provided an opportunity to ask questions and all were answered. The patient agreed with the plan and demonstrated an understanding of the instructions.   The patient was advised to call back or seek an in-person evaluation if the symptoms  worsen or if the condition fails to improve as anticipated.  I provided 6 minutes of non-face-to-face time during this encounter.   Philip Aspen, CNM

## 2021-08-26 ENCOUNTER — Ambulatory Visit (INDEPENDENT_AMBULATORY_CARE_PROVIDER_SITE_OTHER): Payer: Medicaid Other | Admitting: Certified Nurse Midwife

## 2021-08-26 ENCOUNTER — Other Ambulatory Visit: Payer: Self-pay

## 2021-08-26 ENCOUNTER — Encounter: Payer: Self-pay | Admitting: Certified Nurse Midwife

## 2021-08-26 NOTE — Patient Instructions (Signed)

## 2021-08-26 NOTE — Progress Notes (Signed)
Subjective:    Kathleen Glass is a 25 y.o. G35P2002 Caucasian female who presents for a postpartum visit. She is 6 weeks postpartum following a spontaneous vaginal delivery at 40 gestational weeks. Anesthesia: epidural. I have fully reviewed the prenatal and intrapartum course. Postpartum course has been normal. Baby's course has been normal. Baby is feeding by breast & bottle. Bleeding no bleeding. Bowel function is normal. Bladder function is normal. Patient is not sexually active. Last sexual activity: prior to delivery. Contraception method is IUD. Postpartum depression screening: negative. Score 0.  Last pap 02/23/2021 and was negative.  The following portions of the patient's history were reviewed and updated as appropriate: allergies, current medications, past medical history, past surgical history and problem list.  Review of Systems Pertinent items are noted in HPI.   Vitals:   08/26/21 1117  BP: 118/75  Pulse: 80  Weight: 132 lb 11.2 oz (60.2 kg)  Height: 5\' 2"  (1.575 m)   No LMP recorded.  Objective:   General:  alert, cooperative and no distress   Breasts:  deferred, no complaints  Lungs: clear to auscultation bilaterally  Heart:  regular rate and rhythm  Abdomen: soft, nontender   Vulva: normal  Vagina: normal vagina  Cervix:  closed  Corpus: Well-involuted  Adnexa:  Non-palpable  Rectal Exam: no hemorrhoids        Assessment:   Postpartum exam 6 wks s/p SVD Breast & bottle feeding Depression screening Contraception counseling   Plan:  : IUD Follow up in: 1 week for IUD or earlier if needed  Philip Aspen, CNM

## 2021-09-14 ENCOUNTER — Encounter: Payer: Medicaid Other | Admitting: Certified Nurse Midwife

## 2021-09-21 ENCOUNTER — Encounter: Payer: Medicaid Other | Admitting: Certified Nurse Midwife

## 2021-12-01 ENCOUNTER — Encounter: Payer: Medicaid Other | Admitting: Certified Nurse Midwife

## 2021-12-02 ENCOUNTER — Ambulatory Visit (INDEPENDENT_AMBULATORY_CARE_PROVIDER_SITE_OTHER): Payer: Medicaid Other | Admitting: Certified Nurse Midwife

## 2021-12-02 ENCOUNTER — Other Ambulatory Visit: Payer: Self-pay

## 2021-12-02 ENCOUNTER — Encounter: Payer: Self-pay | Admitting: Certified Nurse Midwife

## 2021-12-02 VITALS — BP 131/80 | HR 60 | Ht 62.0 in | Wt 133.5 lb

## 2021-12-02 DIAGNOSIS — Z30019 Encounter for initial prescription of contraceptives, unspecified: Secondary | ICD-10-CM

## 2021-12-02 DIAGNOSIS — Z3200 Encounter for pregnancy test, result unknown: Secondary | ICD-10-CM | POA: Diagnosis not present

## 2021-12-02 LAB — POCT URINE PREGNANCY: Preg Test, Ur: POSITIVE — AB

## 2021-12-02 NOTE — Progress Notes (Signed)
Subjective:    Kathleen Glass is a 26 y.o. female who presents for contraception counseling. The patient has no complaints today. The patient is sexually active. Pertinent past medical history: none.  Menstrual History: OB History     Gravida  3   Para  2   Term  2   Preterm      AB      Living  2      SAB      IAB      Ectopic      Multiple  0   Live Births  2        Obstetric Comments  1st Menstrual Cycle: 11         Urine PCT positive for pregnancy No LMP recorded (lmp unknown). Patient is pregnant.    The following portions of the patient's history were reviewed and updated as appropriate: allergies, current medications, past family history, past medical history, past social history, past surgical history, and problem list.  Review of Systems Pertinent items are noted in HPI.   Objective:    No exam performed today,  not indicated .   Assessment:    26 y.o., positive urine pregnancy test.   Plan:    PT to talk with her partner. Pt given information on [facility] W[facility] choice. She will follow up with me if planning on keeping pregnancy. Other wise she will call a women's choice ASAP .   Philip Aspen, CNM

## 2021-12-07 ENCOUNTER — Encounter: Payer: Self-pay | Admitting: Certified Nurse Midwife

## 2021-12-07 ENCOUNTER — Other Ambulatory Visit: Payer: Self-pay | Admitting: Certified Nurse Midwife

## 2021-12-07 ENCOUNTER — Telehealth: Payer: Self-pay

## 2021-12-07 DIAGNOSIS — N926 Irregular menstruation, unspecified: Secondary | ICD-10-CM

## 2021-12-08 ENCOUNTER — Other Ambulatory Visit: Payer: Self-pay

## 2021-12-08 ENCOUNTER — Other Ambulatory Visit: Payer: Medicaid Other

## 2021-12-08 ENCOUNTER — Ambulatory Visit (INDEPENDENT_AMBULATORY_CARE_PROVIDER_SITE_OTHER): Payer: Medicaid Other

## 2021-12-08 DIAGNOSIS — N926 Irregular menstruation, unspecified: Secondary | ICD-10-CM | POA: Diagnosis not present

## 2021-12-15 ENCOUNTER — Encounter: Payer: Self-pay | Admitting: Certified Nurse Midwife

## 2021-12-23 ENCOUNTER — Other Ambulatory Visit: Payer: Self-pay

## 2021-12-23 ENCOUNTER — Ambulatory Visit (INDEPENDENT_AMBULATORY_CARE_PROVIDER_SITE_OTHER): Payer: Medicaid Other | Admitting: Certified Nurse Midwife

## 2021-12-23 ENCOUNTER — Encounter: Payer: Self-pay | Admitting: Certified Nurse Midwife

## 2021-12-23 VITALS — BP 147/81 | HR 105 | Ht 62.0 in | Wt 131.6 lb

## 2021-12-23 DIAGNOSIS — Z30017 Encounter for initial prescription of implantable subdermal contraceptive: Secondary | ICD-10-CM | POA: Diagnosis not present

## 2021-12-23 NOTE — Patient Instructions (Signed)
Nexplanon Instructions After Insertion  Keep bandage clean and dry for 24 hours  May use ice/Tylenol/Ibuprofen for soreness or pain  If you develop fever, drainage or increased warmth from incision site-contact office immediately   

## 2021-12-23 NOTE — Progress Notes (Signed)
Kathleen Glass is a 26 y.o. year old Caucasian female here for Nexplanon insertion.  Pt had abortion done 12/16/21 and is here today for contraception, last sexual intercourse was prior to abortion.     Risks/benefits/side effects of Nexplanon have been discussed and her questions have been answered.  Specifically, a failure rate of 11/998 has been reported, with an increased failure rate if pt takes Evergreen and/or antiseizure medicaitons.  [name] is aware of the common side effect of irregular bleeding, which the incidence of decreases over time.  BP (!) 147/81    Pulse (!) 105    Ht 5\' 2"  (1.575 m)    Wt 131 lb 9.6 oz (59.7 kg)    LMP  (LMP Unknown)    Breastfeeding Unknown    BMI 24.07 kg/m   No results found for this or any previous visit (from the past 24 hour(s)).   She is right-handed, so her left arm, approximately 4 inches proximal from the elbow, was cleansed with alcohol and anesthetized with 2cc of 2% Lidocaine.  The area was cleansed again with betadine and the Nexplanon was inserted per manufacturer's recommendations without difficulty.  A steri-strip and pressure bandage were applied.  Pt was instructed to keep the area clean and dry, remove pressure bandage in 24 hours, and keep insertion site covered with the steri-strip for 3-5 days.  Back up contraception was recommended for 2 weeks.  She was given a card indicating date Nexplanon was inserted and date it needs to be removed. Follow-up PRN problems.  Deneise Lever Maisen Klingler,CNM

## 2022-08-13 ENCOUNTER — Encounter: Payer: Self-pay | Admitting: Certified Nurse Midwife

## 2022-11-15 NOTE — Progress Notes (Deleted)
    GYNECOLOGY PROGRESS NOTE  Subjective:    Patient ID: [name], female    DOB: Dec 29, 1995, 27 y.o.   MRN: [id number]  HPI  Patient is a 28 y.o. G55P2002 female who presents for consultation for bilateral tubal ligation. Patient notes that she is done with chid-bearing, and desires permanent sterilization.   The following portions of the patient's history were reviewed and updated as appropriate: allergies, current medications, past family history, past medical history, past social history, past surgical history, and problem list.  Review of Systems Pertinent items are noted in HPI.   Objective:   unknown if currently breastfeeding. There is no height or weight on file to calculate BMI.  General appearance: alert, cooperative, and no distress Abdomen: {abdominal exam:16834} Pelvic: {pelvic exam:16852::"cervix normal in appearance","external genitalia normal","no adnexal masses or tenderness","no cervical motion tenderness","rectovaginal septum normal","uterus normal size, shape, and consistency","vagina normal without discharge"} Extremities: {extremity exam:5109} Neurologic: {neuro exam:17854}   Assessment:   No diagnosis found.   Plan:   There are no diagnoses linked to this encounter.    Rubie Maid, MD [place]

## 2022-11-16 ENCOUNTER — Ambulatory Visit: Payer: Medicaid Other | Admitting: Obstetrics and Gynecology

## 2023-05-20 ENCOUNTER — Ambulatory Visit (INDEPENDENT_AMBULATORY_CARE_PROVIDER_SITE_OTHER): Payer: Commercial Managed Care - HMO

## 2023-05-20 VITALS — BP 122/76 | HR 87 | Resp 16 | Ht 61.0 in | Wt 141.0 lb

## 2023-05-20 DIAGNOSIS — Z0130 Encounter for examination of blood pressure without abnormal findings: Secondary | ICD-10-CM

## 2023-05-20 NOTE — Progress Notes (Signed)
    NURSE VISIT NOTE  Subjective:    Patient ID: Kathleen Glass, female    DOB: 1996-06-23, 27 y.o.   MRN: [id number]  HPI  Patient is a 27 y.o. G17P2002 female who presents for BP check per order from no order. Patient came in for nurse visit bp check. I asked her who ordered this bp check for her, she said nobody. She said she was having some lightheadedness on and off so she called to make appointment for bp check. I told patient she needs to see a provider for her concerns. I did take her bp and it was within normal limits at 122/76.  Advised her of signs and symptoms of heart attack or stroke and if she started having any of those symptoms she should call 911 or go to ED. She scheduled an appointment to see Pattricia Boss before she left.   BP Readings from Last 3 Encounters:  12/23/21 (!) 147/81  12/02/21 131/80  08/26/21 118/75   Pulse Readings from Last 3 Encounters:  12/23/21 (!) 105  12/02/21 60  08/26/21 80    Objective:    BP 122/76   Pulse 87   Resp 16   Ht 5\' 1"  (1.549 m)   Wt 141 lb (64 kg)   BMI 26.64 kg/m   Assessment:   1. Blood pressure check      Plan:   Schedule an appointment to see Midwife for concerns.   Patient verbalized understanding of instructions.     Santiago Bumpers, CMA [facility] [address] [place], [place]

## 2023-06-14 ENCOUNTER — Ambulatory Visit: Payer: Commercial Managed Care - HMO | Admitting: Certified Nurse Midwife

## 2023-12-15 ENCOUNTER — Ambulatory Visit: Payer: Commercial Managed Care - HMO

## 2025-01-14 ENCOUNTER — Ambulatory Visit: Admitting: Certified Nurse Midwife
